# Patient Record
Sex: Female | Born: 1980 | Race: Black or African American | Hispanic: No | Marital: Married | Smoking: Never smoker
Health system: Southern US, Community
[De-identification: ages and names within clinical notes are randomized; demographics above are authoritative.]

## PROBLEM LIST (undated history)

## (undated) ENCOUNTER — Inpatient Hospital Stay (HOSPITAL_COMMUNITY): Payer: Self-pay

## (undated) DIAGNOSIS — F32A Depression, unspecified: Secondary | ICD-10-CM

## (undated) DIAGNOSIS — G932 Benign intracranial hypertension: Secondary | ICD-10-CM

## (undated) DIAGNOSIS — M1990 Unspecified osteoarthritis, unspecified site: Secondary | ICD-10-CM

## (undated) DIAGNOSIS — T7840XA Allergy, unspecified, initial encounter: Secondary | ICD-10-CM

## (undated) DIAGNOSIS — D72829 Elevated white blood cell count, unspecified: Secondary | ICD-10-CM

## (undated) DIAGNOSIS — E669 Obesity, unspecified: Secondary | ICD-10-CM

## (undated) DIAGNOSIS — R011 Cardiac murmur, unspecified: Secondary | ICD-10-CM

## (undated) DIAGNOSIS — G8929 Other chronic pain: Secondary | ICD-10-CM

## (undated) DIAGNOSIS — K219 Gastro-esophageal reflux disease without esophagitis: Secondary | ICD-10-CM

## (undated) DIAGNOSIS — I10 Essential (primary) hypertension: Secondary | ICD-10-CM

## (undated) DIAGNOSIS — M549 Dorsalgia, unspecified: Secondary | ICD-10-CM

## (undated) DIAGNOSIS — F419 Anxiety disorder, unspecified: Secondary | ICD-10-CM

## (undated) DIAGNOSIS — O139 Gestational [pregnancy-induced] hypertension without significant proteinuria, unspecified trimester: Secondary | ICD-10-CM

## (undated) DIAGNOSIS — F329 Major depressive disorder, single episode, unspecified: Secondary | ICD-10-CM

## (undated) DIAGNOSIS — O24419 Gestational diabetes mellitus in pregnancy, unspecified control: Secondary | ICD-10-CM

## (undated) DIAGNOSIS — R51 Headache: Secondary | ICD-10-CM

## (undated) DIAGNOSIS — R519 Headache, unspecified: Secondary | ICD-10-CM

## (undated) HISTORY — DX: Anxiety disorder, unspecified: F41.9

## (undated) HISTORY — DX: Gastro-esophageal reflux disease without esophagitis: K21.9

## (undated) HISTORY — DX: Obesity, unspecified: E66.9

## (undated) HISTORY — DX: Elevated white blood cell count, unspecified: D72.829

## (undated) HISTORY — DX: Allergy, unspecified, initial encounter: T78.40XA

## (undated) HISTORY — DX: Cardiac murmur, unspecified: R01.1

## (undated) HISTORY — DX: Unspecified osteoarthritis, unspecified site: M19.90

## (undated) HISTORY — PX: CHOLECYSTECTOMY: SHX55

## (undated) HISTORY — DX: Gestational diabetes mellitus in pregnancy, unspecified control: O24.419

---
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
## 2004-07-17 ENCOUNTER — Emergency Department (HOSPITAL_COMMUNITY): Admission: EM | Admit: 2004-07-17 | Discharge: 2004-07-17 | Payer: Self-pay | Admitting: Emergency Medicine

## 2005-08-21 ENCOUNTER — Ambulatory Visit (HOSPITAL_COMMUNITY): Admission: RE | Admit: 2005-08-21 | Discharge: 2005-08-21 | Payer: Self-pay | Admitting: Gastroenterology

## 2005-11-22 ENCOUNTER — Emergency Department (HOSPITAL_COMMUNITY): Admission: EM | Admit: 2005-11-22 | Discharge: 2005-11-22 | Payer: Self-pay | Admitting: Emergency Medicine

## 2005-12-06 ENCOUNTER — Encounter: Admission: RE | Admit: 2005-12-06 | Discharge: 2005-12-06 | Payer: Self-pay | Admitting: Gastroenterology

## 2007-07-11 ENCOUNTER — Emergency Department (HOSPITAL_COMMUNITY): Admission: EM | Admit: 2007-07-11 | Discharge: 2007-07-12 | Payer: Self-pay | Admitting: Emergency Medicine

## 2009-05-04 ENCOUNTER — Encounter: Admission: RE | Admit: 2009-05-04 | Discharge: 2009-05-04 | Payer: Self-pay | Admitting: Obstetrics and Gynecology

## 2009-06-23 ENCOUNTER — Encounter: Admission: RE | Admit: 2009-06-23 | Discharge: 2009-06-23 | Payer: Self-pay | Admitting: Obstetrics and Gynecology

## 2009-12-04 ENCOUNTER — Emergency Department (HOSPITAL_BASED_OUTPATIENT_CLINIC_OR_DEPARTMENT_OTHER): Admission: EM | Admit: 2009-12-04 | Discharge: 2009-12-04 | Payer: Self-pay | Admitting: Emergency Medicine

## 2010-10-23 ENCOUNTER — Emergency Department (HOSPITAL_BASED_OUTPATIENT_CLINIC_OR_DEPARTMENT_OTHER)
Admission: EM | Admit: 2010-10-23 | Discharge: 2010-10-24 | Disposition: A | Discharge status: Home / Self Care | Payer: PRIVATE HEALTH INSURANCE | Attending: Emergency Medicine | Admitting: Emergency Medicine

## 2010-10-23 ENCOUNTER — Emergency Department (INDEPENDENT_AMBULATORY_CARE_PROVIDER_SITE_OTHER): Payer: PRIVATE HEALTH INSURANCE

## 2010-10-23 DIAGNOSIS — R112 Nausea with vomiting, unspecified: Secondary | ICD-10-CM

## 2010-10-23 DIAGNOSIS — R109 Unspecified abdominal pain: Secondary | ICD-10-CM

## 2010-10-23 LAB — DIFFERENTIAL
Basophils Absolute: 0 10*3/uL (ref 0.0–0.1)
Basophils Relative: 0 % (ref 0–1)
Eosinophils Absolute: 0.1 10*3/uL (ref 0.0–0.7)
Eosinophils Relative: 1 % (ref 0–5)
Lymphocytes Relative: 31 % (ref 12–46)
Lymphs Abs: 3.2 10*3/uL (ref 0.7–4.0)
Monocytes Absolute: 0.7 K/uL (ref 0.1–1.0)
Monocytes Relative: 7 % (ref 3–12)
Neutro Abs: 6.3 10*3/uL (ref 1.7–7.7)
Neutrophils Relative %: 62 % (ref 43–77)

## 2010-10-23 LAB — URINALYSIS, ROUTINE W REFLEX MICROSCOPIC
Bilirubin Urine: NEGATIVE
Hgb urine dipstick: NEGATIVE
Ketones, ur: 15 mg/dL — AB
Nitrite: NEGATIVE
Protein, ur: NEGATIVE mg/dL
Specific Gravity, Urine: 1.019 (ref 1.005–1.030)
Urine Glucose, Fasting: NEGATIVE mg/dL
Urobilinogen, UA: 0.2 mg/dL (ref 0.0–1.0)
pH: 7 (ref 5.0–8.0)

## 2010-10-23 LAB — CBC
HCT: 40.9 % (ref 36.0–46.0)
Hemoglobin: 14.5 g/dL (ref 12.0–15.0)
MCH: 30.3 pg (ref 26.0–34.0)
MCHC: 35.5 g/dL (ref 30.0–36.0)
MCV: 85.6 fL (ref 78.0–100.0)
Platelets: 222 10*3/uL (ref 150–400)
RBC: 4.78 MIL/uL (ref 3.87–5.11)
RDW: 13.4 % (ref 11.5–15.5)
WBC: 10.3 10*3/uL (ref 4.0–10.5)

## 2010-10-23 LAB — COMPREHENSIVE METABOLIC PANEL
ALT: 7 U/L (ref 0–35)
AST: 17 U/L (ref 0–37)
Albumin: 4.6 g/dL (ref 3.5–5.2)
Alkaline Phosphatase: 55 U/L (ref 39–117)
BUN: 9 mg/dL (ref 6–23)
CO2: 23 mEq/L (ref 19–32)
Calcium: 9.6 mg/dL (ref 8.4–10.5)
Chloride: 106 mEq/L (ref 96–112)
Creatinine, Ser: 0.8 mg/dL (ref 0.4–1.2)
GFR calc Af Amer: >60 mL/min (ref >60)
GFR calc non Af Amer: >60 mL/min (ref >60)
Glucose, Bld: 75 mg/dL (ref 70–99)
Potassium: 3.4 mEq/L — ABNORMAL LOW (ref 3.5–5.1)
Sodium: 144 mEq/L (ref 135–145)
Total Bilirubin: 0.7 mg/dL (ref 0.3–1.2)
Total Protein: 8.5 g/dL — ABNORMAL HIGH (ref 6.0–8.3)

## 2010-10-23 LAB — PREGNANCY, URINE: Preg Test, Ur: NEGATIVE

## 2010-10-23 LAB — URINE MICROSCOPIC-ADD ON

## 2010-10-23 LAB — LIPASE, BLOOD: Lipase: 189 U/L (ref 23–300)

## 2010-10-23 MED ORDER — IOHEXOL 300 MG/ML  SOLN
100.0000 mL | Freq: Once | INTRAMUSCULAR | Status: AC | PRN
  Administered 2010-10-23: 100 mL via INTRAVENOUS

## 2010-10-28 ENCOUNTER — Emergency Department (HOSPITAL_BASED_OUTPATIENT_CLINIC_OR_DEPARTMENT_OTHER)
Admission: EM | Admit: 2010-10-28 | Discharge: 2010-10-28 | Disposition: A | Discharge status: Home / Self Care | Payer: PRIVATE HEALTH INSURANCE | Attending: Emergency Medicine | Admitting: Emergency Medicine

## 2010-10-28 DIAGNOSIS — R112 Nausea with vomiting, unspecified: Secondary | ICD-10-CM | POA: Insufficient documentation

## 2010-10-28 LAB — URINALYSIS, ROUTINE W REFLEX MICROSCOPIC
Bilirubin Urine: NEGATIVE
Hgb urine dipstick: NEGATIVE
Ketones, ur: NEGATIVE mg/dL
Nitrite: NEGATIVE
Protein, ur: NEGATIVE mg/dL
Specific Gravity, Urine: 1.04 — ABNORMAL HIGH (ref 1.005–1.030)
Urine Glucose, Fasting: NEGATIVE mg/dL
Urobilinogen, UA: 0.2 mg/dL (ref 0.0–1.0)
pH: 6 (ref 5.0–8.0)

## 2010-10-28 LAB — BASIC METABOLIC PANEL
BUN: 10 mg/dL (ref 6–23)
CO2: 25 mEq/L (ref 19–32)
Calcium: 9.6 mg/dL (ref 8.4–10.5)
Chloride: 105 mEq/L (ref 96–112)
Creatinine, Ser: 0.8 mg/dL (ref 0.4–1.2)
GFR calc Af Amer: >60 mL/min (ref >60)
GFR calc non Af Amer: >60 mL/min (ref >60)
Glucose, Bld: 95 mg/dL (ref 70–99)
Potassium: 4 mEq/L (ref 3.5–5.1)
Sodium: 143 mEq/L (ref 135–145)

## 2010-10-28 LAB — PREGNANCY, URINE: Preg Test, Ur: NEGATIVE

## 2010-10-29 ENCOUNTER — Emergency Department (HOSPITAL_COMMUNITY)
Admission: EM | Admit: 2010-10-29 | Discharge: 2010-10-29 | Disposition: A | Discharge status: Home / Self Care | Payer: PRIVATE HEALTH INSURANCE | Attending: Emergency Medicine | Admitting: Emergency Medicine

## 2010-10-29 DIAGNOSIS — F3289 Other specified depressive episodes: Secondary | ICD-10-CM | POA: Insufficient documentation

## 2010-10-29 DIAGNOSIS — F329 Major depressive disorder, single episode, unspecified: Secondary | ICD-10-CM | POA: Insufficient documentation

## 2010-10-29 LAB — COMPREHENSIVE METABOLIC PANEL
ALT: 11 U/L (ref 0–35)
AST: 14 U/L (ref 0–37)
Albumin: 4.2 g/dL (ref 3.5–5.2)
Alkaline Phosphatase: 41 U/L (ref 39–117)
BUN: 7 mg/dL (ref 6–23)
CO2: 26 mEq/L (ref 19–32)
Calcium: 9.4 mg/dL (ref 8.4–10.5)
Chloride: 104 mEq/L (ref 96–112)
Creatinine, Ser: 0.78 mg/dL (ref 0.4–1.2)
GFR calc Af Amer: >60 mL/min (ref >60)
GFR calc non Af Amer: >60 mL/min (ref >60)
Glucose, Bld: 89 mg/dL (ref 70–99)
Potassium: 3.5 mEq/L (ref 3.5–5.1)
Sodium: 137 mEq/L (ref 135–145)
Total Bilirubin: 0.4 mg/dL (ref 0.3–1.2)
Total Protein: 7.8 g/dL (ref 6.0–8.3)

## 2010-10-29 LAB — CBC
HCT: 39.4 % (ref 36.0–46.0)
Hemoglobin: 14.3 g/dL (ref 12.0–15.0)
MCH: 31.4 pg (ref 26.0–34.0)
MCHC: 36.3 g/dL — ABNORMAL HIGH (ref 30.0–36.0)
MCV: 86.6 fL (ref 78.0–100.0)
Platelets: 294 10*3/uL (ref 150–400)
RBC: 4.55 MIL/uL (ref 3.87–5.11)
RDW: 13.3 % (ref 11.5–15.5)
WBC: 10.2 10*3/uL (ref 4.0–10.5)

## 2010-10-29 LAB — RAPID URINE DRUG SCREEN, HOSP PERFORMED
Amphetamines: NOT DETECTED
Barbiturates: NOT DETECTED
Benzodiazepines: POSITIVE — AB
Cocaine: NOT DETECTED
Opiates: NOT DETECTED
Tetrahydrocannabinol: NOT DETECTED

## 2010-10-29 LAB — DIFFERENTIAL
Basophils Absolute: 0 10*3/uL (ref 0.0–0.1)
Basophils Relative: 0 % (ref 0–1)
Eosinophils Absolute: 0.1 10*3/uL (ref 0.0–0.7)
Eosinophils Relative: 1 % (ref 0–5)
Lymphocytes Relative: 38 % (ref 12–46)
Lymphs Abs: 3.8 10*3/uL (ref 0.7–4.0)
Monocytes Absolute: 0.5 10*3/uL (ref 0.1–1.0)
Monocytes Relative: 5 % (ref 3–12)
Neutro Abs: 5.8 10*3/uL (ref 1.7–7.7)
Neutrophils Relative %: 57 % (ref 43–77)

## 2010-10-29 LAB — POCT PREGNANCY, URINE: Preg Test, Ur: NEGATIVE

## 2010-10-29 LAB — ETHANOL: Alcohol, Ethyl (B): <5 mg/dL (ref 0–10)

## 2010-11-15 ENCOUNTER — Ambulatory Visit (HOSPITAL_COMMUNITY): Payer: PRIVATE HEALTH INSURANCE | Admitting: Psychology

## 2010-11-18 ENCOUNTER — Encounter (INDEPENDENT_AMBULATORY_CARE_PROVIDER_SITE_OTHER): Payer: PRIVATE HEALTH INSURANCE | Admitting: Psychology

## 2010-11-18 DIAGNOSIS — F322 Major depressive disorder, single episode, severe without psychotic features: Secondary | ICD-10-CM

## 2010-11-22 ENCOUNTER — Encounter (HOSPITAL_COMMUNITY): Payer: PRIVATE HEALTH INSURANCE | Admitting: Psychology

## 2010-12-26 ENCOUNTER — Other Ambulatory Visit: Payer: Self-pay | Admitting: Obstetrics and Gynecology

## 2010-12-26 DIAGNOSIS — N6321 Unspecified lump in the left breast, upper outer quadrant: Secondary | ICD-10-CM

## 2010-12-29 ENCOUNTER — Ambulatory Visit
Admission: RE | Admit: 2010-12-29 | Discharge: 2010-12-29 | Disposition: A | Discharge status: Home / Self Care | Payer: PRIVATE HEALTH INSURANCE | Source: Ambulatory Visit | Attending: Obstetrics and Gynecology | Admitting: Obstetrics and Gynecology

## 2010-12-29 ENCOUNTER — Other Ambulatory Visit: Payer: PRIVATE HEALTH INSURANCE

## 2010-12-29 DIAGNOSIS — N6321 Unspecified lump in the left breast, upper outer quadrant: Secondary | ICD-10-CM

## 2011-01-12 ENCOUNTER — Other Ambulatory Visit: Payer: Self-pay | Admitting: Neurological Surgery

## 2011-01-12 DIAGNOSIS — M545 Low back pain: Secondary | ICD-10-CM

## 2011-01-17 ENCOUNTER — Ambulatory Visit
Admission: RE | Admit: 2011-01-17 | Discharge: 2011-01-17 | Disposition: A | Discharge status: Home / Self Care | Payer: PRIVATE HEALTH INSURANCE | Source: Ambulatory Visit | Attending: Neurological Surgery | Admitting: Neurological Surgery

## 2011-01-17 DIAGNOSIS — M545 Low back pain: Secondary | ICD-10-CM

## 2011-02-03 NOTE — Op Note (Signed)
[name], Tracey Beard               ACCOUNT NO.:  [id number]   MEDICAL RECORD NO.:  [id number]          PATIENT TYPE:  AMB   LOCATION:  ENDO                         FACILITY:  [facility]   PHYSICIAN:  Anselmo Rod, M.D.  DATE OF BIRTH:  28-Oct-1980   DATE OF PROCEDURE:  08/21/2005  DATE OF DISCHARGE:                                 OPERATIVE REPORT   PROCEDURE PERFORMED:  Colonoscopy.   ENDOSCOPIST:  Anselmo Rod, M.D.   INSTRUMENT USED:  Olympus video colonoscope.   INDICATIONS FOR PROCEDURE:  A 30 year old [place] American female with a  history of worsening constipation, blood in stool, rule out colonic polyps,  masses, etc.   PREPROCEDURE PREPARATION:  Informed consent was procured from the patient.  The patient fasted for eight hours prior to the procedure and prepped with a  bottle of MiraLax and Gatorade the night of the procedure.  Risks and  benefits of the procedure including a 10% miss rate of cancer and polyps was  discussed with the patient as well.   PREPROCEDURE PHYSICAL:  VITAL SIGNS:  Stable vital signs.  NECK:  Supple.  CHEST:  Clear to auscultation.  CARDIOVASCULAR:  S1 and S2 regular.  ABDOMEN:  Soft with normal bowel sounds.   DESCRIPTION OF PROCEDURE:  The patient was placed in left lateral decubitus  position, sedated with an addition 1 mg of Versed.  Once the patient was  adequately sedated and maintained on low flow oxygen and continuous cardiac  monitoring, the Olympus video colonoscope was advanced from the rectum to  the cecum.  There was large amount of residual stool in the dependent areas  of the colon.  Multiple washings were done.  No other masses, polyps,  erosions or ulcerations or diverticula  were seen.  Retroflexion in the  rectum revealed small internal hemorrhoids.  The patient tolerated the  procedure well without immediate complications.   IMPRESSION:  1.Large amount of residual stool in the colon.  2.Small internal hemorrhoids seen  on retroflexion.   RECOMMENDATIONS:  I suspect the patient has irritable bowel syndrome and  therefore, trial of Zelnorm 6 mg PO BID has been recommended. Further  recommendation will be made in follow-up.      Anselmo Rod, M.D.  Electronically Signed     JNM/MEDQ  D:  08/21/2005  T:  08/22/2005  Job:  [id number]   cc:   Maxie Better, M.D.  Fax: [phone]

## 2011-02-03 NOTE — Op Note (Signed)
Tracey Beard, Tracey Beard               ACCOUNT NO.:  [id number]   MEDICAL RECORD NO.:  [id number]          PATIENT TYPE:  AMB   LOCATION:  ENDO                         FACILITY:  [facility]   PHYSICIAN:  Anselmo Rod, M.D.  DATE OF BIRTH:  1980-09-28   DATE OF PROCEDURE:  08/21/2005  DATE OF DISCHARGE:                                 OPERATIVE REPORT   PROCEDURE PERFORMED:  Esophagogastroduodenoscopy.   ENDOSCOPIST:  Anselmo Rod, M.D.   INSTRUMENT USED:  Olympus video panendoscope.   INDICATIONS FOR PROCEDURE:  A 30 year old [place] American female with a  history of severe abdominal pain, epigastric discomfort, rule out peptic  ulcer disease, esophagitis, gastritis, etc.   PREPROCEDURE PREPARATION:  Informed consent was procured from the patient.  The patient fasted for eight hours prior to the procedure.   PREPROCEDURE PHYSICAL EXAMINATION:  VITAL SIGNS:  Stable.  NECK:  Supple.  CHEST:  Clear to auscultation.  CARDIOVASCULAR:  S1 and S2 regular.  ABDOMEN:  Soft with normal bowel sounds.   DESCRIPTION OF PROCEDURE:  The patient was placed in left lateral decubitus  position, sedated with 100 mcg of Fentanyl and 9  mg of Versed in slow  incremental doses.  Once the patient was adequately sedated and maintained  on low flow oxygen and continuous cardiac monitoring, the Olympus video  panendoscope was advanced through the mouthpiece over the tongue and into  the esophagus under direct vision.  The entire esophagus appeared normal  with no evidence of ring, stricture, mass, esophagitis or Barrett's mucosa.  The scope was then advanced in the stomach.  The entire gastric mucosa and  proximal small-bowel appeared normal.   IMPRESSION:  Normal esophagogastroduodenoscopy.   RECOMMENDATIONS:  Proceed with colonoscopy at this time. Further  recommendations made thereafter.      Anselmo Rod, M.D.  Electronically Signed    JNM/MEDQ  D:  08/21/2005  T:  08/22/2005  Job:   [id number]   cc:   Maxie Better, M.D.  Fax: [phone]

## 2011-06-28 LAB — CBC
HCT: 38
Hemoglobin: 13.1
MCHC: 34.4
MCV: 88.7
Platelets: 261
RBC: 4.29
RDW: 13
WBC: 8

## 2011-06-28 LAB — DIFFERENTIAL
Basophils Absolute: 0
Basophils Relative: 0
Eosinophils Absolute: 0
Eosinophils Relative: 1
Lymphocytes Relative: 39
Lymphs Abs: 3.1
Monocytes Absolute: 0.6
Monocytes Relative: 7
Neutro Abs: 4.2
Neutrophils Relative %: 53

## 2011-06-28 LAB — BASIC METABOLIC PANEL
BUN: 13
CO2: 24
Calcium: 9.4
Chloride: 106
Creatinine, Ser: 0.72
GFR calc Af Amer: >60
GFR calc non Af Amer: >60
Glucose, Bld: 124 — ABNORMAL HIGH
Potassium: 3.7
Sodium: 137

## 2011-06-28 LAB — D-DIMER, QUANTITATIVE: D-Dimer, Quant: <0.22

## 2011-09-19 ENCOUNTER — Encounter: Payer: Self-pay | Admitting: *Deleted

## 2011-09-19 ENCOUNTER — Emergency Department (HOSPITAL_BASED_OUTPATIENT_CLINIC_OR_DEPARTMENT_OTHER)
Admission: EM | Admit: 2011-09-19 | Discharge: 2011-09-19 | Disposition: A | Discharge status: Home / Self Care | Payer: 59 | Attending: Emergency Medicine | Admitting: Emergency Medicine

## 2011-09-19 DIAGNOSIS — R109 Unspecified abdominal pain: Secondary | ICD-10-CM | POA: Insufficient documentation

## 2011-09-19 DIAGNOSIS — R1110 Vomiting, unspecified: Secondary | ICD-10-CM

## 2011-09-19 DIAGNOSIS — N390 Urinary tract infection, site not specified: Secondary | ICD-10-CM | POA: Insufficient documentation

## 2011-09-19 DIAGNOSIS — G8929 Other chronic pain: Secondary | ICD-10-CM | POA: Insufficient documentation

## 2011-09-19 DIAGNOSIS — R197 Diarrhea, unspecified: Secondary | ICD-10-CM | POA: Insufficient documentation

## 2011-09-19 DIAGNOSIS — M549 Dorsalgia, unspecified: Secondary | ICD-10-CM | POA: Insufficient documentation

## 2011-09-19 HISTORY — DX: Major depressive disorder, single episode, unspecified: F32.9

## 2011-09-19 HISTORY — DX: Depression, unspecified: F32.A

## 2011-09-19 LAB — URINALYSIS, ROUTINE W REFLEX MICROSCOPIC
Bilirubin Urine: NEGATIVE
Glucose, UA: NEGATIVE mg/dL
Hgb urine dipstick: NEGATIVE
Ketones, ur: NEGATIVE mg/dL
Nitrite: NEGATIVE
Protein, ur: NEGATIVE mg/dL
Specific Gravity, Urine: 1.015 (ref 1.005–1.030)
Urobilinogen, UA: 0.2 mg/dL (ref 0.0–1.0)
pH: 6.5 (ref 5.0–8.0)

## 2011-09-19 LAB — BASIC METABOLIC PANEL
BUN: 7 mg/dL (ref 6–23)
CO2: 25 mEq/L (ref 19–32)
Calcium: 9.2 mg/dL (ref 8.4–10.5)
Chloride: 103 mEq/L (ref 96–112)
Creatinine, Ser: 0.7 mg/dL (ref 0.50–1.10)
GFR calc Af Amer: >90 mL/min (ref >90)
GFR calc non Af Amer: >90 mL/min (ref >90)
Glucose, Bld: 93 mg/dL (ref 70–99)
Potassium: 3.8 mEq/L (ref 3.5–5.1)
Sodium: 139 mEq/L (ref 135–145)

## 2011-09-19 LAB — URINE MICROSCOPIC-ADD ON

## 2011-09-19 LAB — PREGNANCY, URINE: Preg Test, Ur: NEGATIVE

## 2011-09-19 MED ORDER — SODIUM CHLORIDE 0.9 % IV BOLUS (SEPSIS)
1000.0000 mL | Freq: Once | INTRAVENOUS | Status: AC
  Administered 2011-09-19: 1000 mL via INTRAVENOUS

## 2011-09-19 MED ORDER — ONDANSETRON HCL 4 MG/2ML IJ SOLN
4.0000 mg | Freq: Once | INTRAMUSCULAR | Status: AC
  Administered 2011-09-19: 4 mg via INTRAVENOUS
  Filled 2011-09-19: qty 2

## 2011-09-19 MED ORDER — SULFAMETHOXAZOLE-TRIMETHOPRIM 800-160 MG PO TABS: 1.0000 | ORAL_TABLET | Freq: Two times a day (BID) | ORAL | Status: AC

## 2011-09-19 MED ORDER — ONDANSETRON HCL 4 MG PO TABS: 4.0000 mg | ORAL_TABLET | Freq: Four times a day (QID) | ORAL | Status: AC

## 2011-09-19 MED ORDER — OXYCODONE-ACETAMINOPHEN 5-325 MG PO TABS: 2.0000 | ORAL_TABLET | ORAL | Status: AC | PRN

## 2011-09-19 MED ORDER — MORPHINE SULFATE 4 MG/ML IJ SOLN
4.0000 mg | Freq: Once | INTRAMUSCULAR | Status: AC
  Administered 2011-09-19: 4 mg via INTRAVENOUS
  Filled 2011-09-19: qty 1

## 2011-09-19 NOTE — ED Notes (Signed)
Abdominal pain, aching all over, nausea, vomiting and diarrhea since yesterday.

## 2011-09-19 NOTE — ED Provider Notes (Signed)
History     CSN: [id number]  Arrival date & time 09/19/11  4098   First MD Initiated Contact with Patient 09/19/11 1911      Chief Complaint  Patient presents with  . Abdominal Pain    (Consider location/radiation/quality/duration/timing/severity/associated sxs/prior treatment) HPI Comments: Pt states that she has chronic back pain and she is out of her percocet and she is need something more for pain:pt states that she sees Dr. Danielle Dess  Patient is a 31 y.o. female presenting with abdominal pain. The history is provided by the patient. No language interpreter was used.  Abdominal Pain The primary symptoms of the illness include abdominal pain, nausea, vomiting and diarrhea. The primary symptoms of the illness do not include fever, dysuria or vaginal discharge. The current episode started yesterday. The onset of the illness was sudden. The problem has not changed since onset. The patient states that she believes she is currently not pregnant. The patient has not had a change in bowel habit. Symptoms associated with the illness do not include constipation, urgency, frequency or back pain.    Past Medical History  Diagnosis Date  . Depression     History reviewed. No pertinent past surgical history.  No family history on file.  History  Substance Use Topics  . Smoking status: Never Smoker   . Smokeless tobacco: Not on file  . Alcohol Use: No    OB History    Grav Para Term Preterm Abortions TAB SAB Ect Mult Living                  Review of Systems  Constitutional: Negative for fever.  Gastrointestinal: Positive for nausea, vomiting, abdominal pain and diarrhea. Negative for constipation.  Genitourinary: Negative for dysuria, urgency, frequency and vaginal discharge.  Musculoskeletal: Negative for back pain.  All other systems reviewed and are negative.    Allergies  Lortab; Maxalt; Caine-1; and Latex  Home Medications   Current Outpatient Rx  Name Route Sig  Dispense Refill  . ALPRAZOLAM 1 MG PO TABS Oral Take 0.5 mg by mouth once as needed. For sleep     . IBUPROFEN 200 MG PO TABS Oral Take 800 mg by mouth every 6 (six) hours as needed. For pain      . OMEGA-3 FATTY ACIDS 500 MG PO CAPS Oral Take 1 capsule by mouth 4 (four) times daily.      Marland Kitchen PARAGARD INTRAUTERINE COPPER IU IUD Intrauterine 1 each by Intrauterine route once. Inserted 2012       BP 126/64  Pulse 104  Temp(Src) 99.5 F (37.5 C) (Oral)  Resp 20  SpO2 100%  LMP 09/11/2011  Physical Exam  Nursing note and vitals reviewed. Constitutional: She is oriented to person, place, and time. She appears well-developed and well-nourished.  HENT:  Head: Normocephalic.  Eyes: EOM are normal.  Neck: Normal range of motion. Neck supple.  Cardiovascular: Normal rate and regular rhythm.   Pulmonary/Chest: Effort normal and breath sounds normal.  Abdominal: Soft. Bowel sounds are normal. There is no tenderness. There is no CVA tenderness.  Musculoskeletal: Normal range of motion.  Neurological: She is alert and oriented to person, place, and time.  Skin: Skin is warm and dry.  Psychiatric: She has a normal mood and affect.    ED Course  Procedures (including critical care time)  Labs Reviewed  URINALYSIS, ROUTINE W REFLEX MICROSCOPIC - Abnormal; Notable for the following:    Leukocytes, UA TRACE (*)    All  other components within normal limits  URINE MICROSCOPIC-ADD ON - Abnormal; Notable for the following:    Bacteria, UA FEW (*)    All other components within normal limits  PREGNANCY, URINE  BASIC METABOLIC PANEL   No results found.   1. UTI (lower urinary tract infection)   2. Chronic back pain   3. Vomiting and diarrhea       MDM  Will treat pt for simple uti:pt is tolerating po without any problem:will give a couple of percocet for chronic pain        Teressa Lower, NP 09/19/11 2139

## 2011-09-20 NOTE — ED Provider Notes (Signed)
Medical screening examination/treatment/procedure(s) were performed by non-physician practitioner and as supervising physician I was immediately available for consultation/collaboration.   Vida Roller, MD 09/20/11 [phone]

## 2011-11-03 ENCOUNTER — Emergency Department (HOSPITAL_COMMUNITY)
Admission: EM | Admit: 2011-11-03 | Discharge: 2011-11-04 | Disposition: A | Discharge status: Home / Self Care | Payer: Self-pay | Attending: Emergency Medicine | Admitting: Emergency Medicine

## 2011-11-03 ENCOUNTER — Encounter (HOSPITAL_COMMUNITY): Payer: Self-pay | Admitting: Emergency Medicine

## 2011-11-03 DIAGNOSIS — M545 Low back pain, unspecified: Secondary | ICD-10-CM | POA: Insufficient documentation

## 2011-11-03 DIAGNOSIS — R269 Unspecified abnormalities of gait and mobility: Secondary | ICD-10-CM | POA: Insufficient documentation

## 2011-11-03 DIAGNOSIS — M25559 Pain in unspecified hip: Secondary | ICD-10-CM | POA: Insufficient documentation

## 2011-11-03 DIAGNOSIS — G8929 Other chronic pain: Secondary | ICD-10-CM | POA: Insufficient documentation

## 2011-11-03 DIAGNOSIS — M549 Dorsalgia, unspecified: Secondary | ICD-10-CM | POA: Insufficient documentation

## 2011-11-03 HISTORY — DX: Dorsalgia, unspecified: M54.9

## 2011-11-03 HISTORY — DX: Other chronic pain: G89.29

## 2011-11-03 NOTE — ED Provider Notes (Signed)
History     CSN: [id number]  Arrival date & time 11/03/11  2209   First MD Initiated Contact with Patient 11/03/11 2351      Chief Complaint  Patient presents with  . Back Pain  . Hip Pain    (Consider location/radiation/quality/duration/timing/severity/associated sxs/prior treatment) HPI  31 year old female with history of chronic back pain, with multiple ER visits for same, is presenting to the ED with chief complaints of low back pain. Pt sts she has been having low back pain for the past 5-6 years. In the past several days she is having increase pain to the lower back worsening on the left side. Pain is described as a sharp, and throbbing sensation radiating down to her left leg. Pain is similar to prior back pain. Patient states she has been evaluated by Delbert Harness in the past and also by Dr. Danielle Dess.  She has a lumbar MRI performed last year which shows mild disc protrusion at L4.  Pt received a "shot" at the visit with Dr. Danielle Dess.  She continues to have wax/wane pain. For the past several days she is unable to walk due to the pain. Her low back pain is worse in with sitting and improves with lying flat. She denies urinary or bowel incontinence, or caudal equina symptoms. Patient denies fever, chest pain, shortness of breath, abdominal pain, dysuria, or rash.  She denies any recent trauma.   Past Medical History  Diagnosis Date  . Depression   . Chronic back pain     History reviewed. No pertinent past surgical history.  No family history on file.  History  Substance Use Topics  . Smoking status: Never Smoker   . Smokeless tobacco: Not on file  . Alcohol Use: Yes     socially    OB History    Grav Para Term Preterm Abortions TAB SAB Ect Mult Living                  Review of Systems  All other systems reviewed and are negative.    Allergies  Lortab; Maxalt; Caine-1; and Latex  Home Medications   Current Outpatient Rx  Name Route Sig Dispense Refill  .  ALPRAZOLAM 1 MG PO TABS Oral Take 0.5 mg by mouth once as needed. For sleep     . CYANOCOBALAMIN 1000 MCG/ML IJ SOLN Intramuscular Inject 1,000 mcg into the muscle once.    . ICE & HEAT WRAP/GEL PACK PADS Does not apply 1 patch by Does not apply route daily as needed. USE FOR PAIN    . IBUPROFEN 200 MG PO TABS Oral Take 800 mg by mouth every 6 (six) hours as needed. For pain      . PARAGARD INTRAUTERINE COPPER IU IUD Intrauterine 1 each by Intrauterine route once. Inserted 2012     . OXYCODONE-ACETAMINOPHEN 10-325 MG PO TABS Oral Take 1 tablet by mouth every 4 (four) hours as needed. PAIN      BP 128/77  Pulse 101  Temp 98.4 F (36.9 C)  Resp 20  SpO2 100%  LMP 10/25/2011  Physical Exam  Constitutional: She is oriented to person, place, and time. She appears well-developed and well-nourished. No distress.       Patient appears tearful  HENT:  Head: Normocephalic and atraumatic.  Eyes: Conjunctivae are normal.  Neck: Normal range of motion. Neck supple.  Cardiovascular: Normal rate and regular rhythm.  Exam reveals no gallop and no friction rub.   No murmur heard.  Pulmonary/Chest: Effort normal. No respiratory distress. She has no wheezes.  Abdominal: Soft. Bowel sounds are normal. There is no tenderness.  Musculoskeletal:       Cervical back: Normal.       Thoracic back: Normal.       Lumbar back: She exhibits decreased range of motion, tenderness and pain. She exhibits no bony tenderness, no swelling, no edema, no deformity, no laceration and no spasm.  Neurological: She is alert and oriented to person, place, and time. She displays abnormal reflex.  Reflex Scores:      Patellar reflexes are 2+ on the right side and 0 on the left side.      Achilles reflexes are 2+ on the right side and 0 on the left side.      R lower extremities 5/5 strength L lower extremities 4/5 strength  Antalgic gait favoring L side.      ED Course  Procedures (including critical care time)  Labs  Reviewed - No data to display No results found.   No diagnosis found.    MDM  Pt with acute on chronic back pain.  She has decreased left patella DTR, but sts that she is aware since last checked by Dr. Danielle Dess.  She denies urinary/bowel incontinence or caudal equina sxs.  She does have f/u appointment with Dr. Alvester Morin.  With her persistent pain, we will attempt to pain control here in ED and will discharge with pain medication.    12:46 AM Discussed pt care with Sabino Dick, NP, who will assume care.      Fayrene Helper, PA-C 11/04/11 [phone]

## 2011-11-03 NOTE — ED Notes (Signed)
Pt presenting to ed with c/o back pain, hip pain and radiating into her pelvis. Pt states chronic pain x 5-6 years.

## 2011-11-04 LAB — URINALYSIS, ROUTINE W REFLEX MICROSCOPIC
Bilirubin Urine: NEGATIVE
Glucose, UA: NEGATIVE mg/dL
Hgb urine dipstick: NEGATIVE
Leukocytes, UA: NEGATIVE
Nitrite: NEGATIVE
Protein, ur: NEGATIVE mg/dL
Specific Gravity, Urine: 1.02 (ref 1.005–1.030)
Urobilinogen, UA: 0.2 mg/dL (ref 0.0–1.0)
pH: 5.5 (ref 5.0–8.0)

## 2011-11-04 LAB — PREGNANCY, URINE: Preg Test, Ur: NEGATIVE

## 2011-11-04 MED ORDER — OXYCODONE-ACETAMINOPHEN 10-325 MG PO TABS: 1.0000 | ORAL_TABLET | ORAL | Status: DC | PRN

## 2011-11-04 MED ORDER — OXYCODONE-ACETAMINOPHEN 5-325 MG PO TABS
2.0000 | ORAL_TABLET | Freq: Once | ORAL | Status: AC
  Administered 2011-11-04: 2 via ORAL
  Filled 2011-11-04: qty 2

## 2011-11-04 MED ORDER — ONDANSETRON 8 MG PO TBDP
8.0000 mg | ORAL_TABLET | Freq: Once | ORAL | Status: AC
  Administered 2011-11-04: 8 mg via ORAL
  Filled 2011-11-04: qty 1

## 2011-11-04 MED ORDER — HYDROMORPHONE HCL PF 1 MG/ML IJ SOLN
1.0000 mg | Freq: Once | INTRAMUSCULAR | Status: AC
Start: 2011-11-04 — End: 2011-11-04
  Administered 2011-11-04: 1 mg via INTRAVENOUS
  Filled 2011-11-04: qty 1

## 2011-11-04 NOTE — ED Provider Notes (Addendum)
Complains of low back pain left-sided paralumbar radiating to left hip for 6 years pain worse tonight. No loss of bladder or bowel control no fever Has run out of Percocet. No treatment prior to coming here patient alert mildly uncomfortable. Plan pain medicine intravenously. 1:25 AM patient's pain is much improved and she feels ready to go home she ambulates without difficulty Results for orders placed during the hospital encounter of 11/03/11  URINALYSIS, ROUTINE W REFLEX MICROSCOPIC      Component Value Range   Color, Urine YELLOW  YELLOW    APPearance CLEAR  CLEAR    Specific Gravity, Urine 1.020  1.005 - 1.030    pH 5.5  5.0 - 8.0    Glucose, UA NEGATIVE  NEGATIVE (mg/dL)   Hgb urine dipstick NEGATIVE  NEGATIVE    Bilirubin Urine NEGATIVE  NEGATIVE    Ketones, ur TRACE (*) NEGATIVE (mg/dL)   Protein, ur NEGATIVE  NEGATIVE (mg/dL)   Urobilinogen, UA 0.2  0.0 - 1.0 (mg/dL)   Nitrite NEGATIVE  NEGATIVE    Leukocytes, UA NEGATIVE  NEGATIVE   PREGNANCY, URINE      Component Value Range   Preg Test, Ur NEGATIVE  NEGATIVE    No results found.  Doug Sou, MD 11/04/11 1610  Doug Sou, MD 11/04/11 [id number]  Doug Sou, MD 11/04/11 5409

## 2011-11-04 NOTE — ED Provider Notes (Signed)
Medical screening examination/treatment/procedure(s) were conducted as a shared visit with non-physician practitioner(s) and myself.  I personally evaluated the patient during the encounter  Doug Sou, MD 11/04/11 [phone]

## 2011-11-04 NOTE — Discharge Instructions (Signed)
Back Pain, Adult Low back pain is very common. About 1 in 5 people have back pain.The cause of low back pain is rarely dangerous. The pain often gets better over time.About half of people with a sudden onset of back pain feel better in just 2 weeks. About 8 in 10 people feel better by 6 weeks.  CAUSES Some common causes of back pain include:  Strain of the muscles or ligaments supporting the spine.   Wear and tear (degeneration) of the spinal discs.   Arthritis.   Direct injury to the back.  DIAGNOSIS Most of the time, the direct cause of low back pain is not known.However, back pain can be treated effectively even when the exact cause of the pain is unknown.Answering your caregiver's questions about your overall health and symptoms is one of the most accurate ways to make sure the cause of your pain is not dangerous. If your caregiver needs more information, he or she may order lab work or imaging tests (X-rays or MRIs).However, even if imaging tests show changes in your back, this usually does not require surgery. HOME CARE INSTRUCTIONS For many people, back pain returns.Since low back pain is rarely dangerous, it is often a condition that people can learn to manageon their own.   Remain active. It is stressful on the back to sit or stand in one place. Do not sit, drive, or stand in one place for more than 30 minutes at a time. Take short walks on level surfaces as soon as pain allows.Try to increase the length of time you walk each day.   Do not stay in bed.Resting more than 1 or 2 days can delay your recovery.   Do not avoid exercise or work.Your body is made to move.It is not dangerous to be active, even though your back may hurt.Your back will likely heal faster if you return to being active before your pain is gone.   Pay attention to your body when you bend and lift. Many people have less discomfortwhen lifting if they bend their knees, keep the load close to their  bodies,and avoid twisting. Often, the most comfortable positions are those that put less stress on your recovering back.   Find a comfortable position to sleep. Use a firm mattress and lie on your side with your knees slightly bent. If you lie on your back, put a pillow under your knees.   Only take over-the-counter or prescription medicines as directed by your caregiver. Over-the-counter medicines to reduce pain and inflammation are often the most helpful.Your caregiver may prescribe muscle relaxant drugs.These medicines help dull your pain so you can more quickly return to your normal activities and healthy exercise.   Put ice on the injured area.   Put ice in a plastic bag.   Place a towel between your skin and the bag.   Leave the ice on for 15 to 20 minutes, 3 to 4 times a day for the first 2 to 3 days. After that, ice and heat may be alternated to reduce pain and spasms.   Ask your caregiver about trying back exercises and gentle massage. This may be of some benefit.   Avoid feeling anxious or stressed.Stress increases muscle tension and can worsen back pain.It is important to recognize when you are anxious or stressed and learn ways to manage it.Exercise is a great option.  SEEK MEDICAL CARE IF:  You have pain that is not relieved with rest or medicine.   You have   pain that does not improve in 1 week.   You have new symptoms.   You are generally not feeling well.  SEEK IMMEDIATE MEDICAL CARE IF:   You have pain that radiates from your back into your legs.   You develop new bowel or bladder control problems.   You have unusual weakness or numbness in your arms or legs.   You develop nausea or vomiting.   You develop abdominal pain.   You feel faint.  Document Released: 09/04/2005 Document Revised: 05/17/2011 Document Reviewed: 01/23/2011 [facility] Patient Information 2012 [place], [place].  Keep your scheduled appointment with [facility] orthopedics next week

## 2012-01-13 ENCOUNTER — Emergency Department (HOSPITAL_BASED_OUTPATIENT_CLINIC_OR_DEPARTMENT_OTHER)
Admission: EM | Admit: 2012-01-13 | Discharge: 2012-01-13 | Disposition: A | Discharge status: Home / Self Care | Payer: Self-pay | Attending: Emergency Medicine | Admitting: Emergency Medicine

## 2012-01-13 ENCOUNTER — Encounter (HOSPITAL_BASED_OUTPATIENT_CLINIC_OR_DEPARTMENT_OTHER): Payer: Self-pay | Admitting: *Deleted

## 2012-01-13 DIAGNOSIS — R42 Dizziness and giddiness: Secondary | ICD-10-CM | POA: Insufficient documentation

## 2012-01-13 DIAGNOSIS — R51 Headache: Secondary | ICD-10-CM | POA: Insufficient documentation

## 2012-01-13 DIAGNOSIS — G8929 Other chronic pain: Secondary | ICD-10-CM | POA: Insufficient documentation

## 2012-01-13 DIAGNOSIS — N390 Urinary tract infection, site not specified: Secondary | ICD-10-CM | POA: Insufficient documentation

## 2012-01-13 LAB — URINALYSIS, ROUTINE W REFLEX MICROSCOPIC
Bilirubin Urine: NEGATIVE
Glucose, UA: NEGATIVE mg/dL
Hgb urine dipstick: NEGATIVE
Ketones, ur: NEGATIVE mg/dL
Nitrite: NEGATIVE
Protein, ur: NEGATIVE mg/dL
Specific Gravity, Urine: 1.023 (ref 1.005–1.030)
Urobilinogen, UA: 0.2 mg/dL (ref 0.0–1.0)
pH: 6.5 (ref 5.0–8.0)

## 2012-01-13 LAB — BASIC METABOLIC PANEL
BUN: 10 mg/dL (ref 6–23)
CO2: 29 mEq/L (ref 19–32)
Calcium: 9.5 mg/dL (ref 8.4–10.5)
Chloride: 102 mEq/L (ref 96–112)
Creatinine, Ser: 0.7 mg/dL (ref 0.50–1.10)
GFR calc Af Amer: >90 mL/min (ref >90)
GFR calc non Af Amer: >90 mL/min (ref >90)
Glucose, Bld: 96 mg/dL (ref 70–99)
Potassium: 4.5 mEq/L (ref 3.5–5.1)
Sodium: 139 mEq/L (ref 135–145)

## 2012-01-13 LAB — URINE MICROSCOPIC-ADD ON

## 2012-01-13 LAB — CBC
HCT: 34 % — ABNORMAL LOW (ref 36.0–46.0)
Hemoglobin: 11.6 g/dL — ABNORMAL LOW (ref 12.0–15.0)
MCH: 29.4 pg (ref 26.0–34.0)
MCHC: 34.1 g/dL (ref 30.0–36.0)
MCV: 86.1 fL (ref 78.0–100.0)
Platelets: 331 10*3/uL (ref 150–400)
RBC: 3.95 MIL/uL (ref 3.87–5.11)
RDW: 13.1 % (ref 11.5–15.5)
WBC: 10.4 10*3/uL (ref 4.0–10.5)

## 2012-01-13 LAB — PREGNANCY, URINE: Preg Test, Ur: NEGATIVE

## 2012-01-13 MED ORDER — CEPHALEXIN 500 MG PO CAPS: 500.0000 mg | ORAL_CAPSULE | Freq: Two times a day (BID) | ORAL | Status: DC

## 2012-01-13 MED ORDER — LORAZEPAM 2 MG/ML IJ SOLN
2.0000 mg | Freq: Once | INTRAMUSCULAR | Status: AC
  Administered 2012-01-13: 2 mg via INTRAMUSCULAR

## 2012-01-13 MED ORDER — CEPHALEXIN 500 MG PO CAPS: 500.0000 mg | ORAL_CAPSULE | Freq: Two times a day (BID) | ORAL | Status: AC

## 2012-01-13 MED ORDER — CEPHALEXIN 250 MG PO CAPS
500.0000 mg | ORAL_CAPSULE | Freq: Once | ORAL | Status: AC
  Administered 2012-01-13: 500 mg via ORAL
  Filled 2012-01-13: qty 2

## 2012-01-13 MED ORDER — MECLIZINE HCL 25 MG PO TABS: 25.0000 mg | ORAL_TABLET | Freq: Three times a day (TID) | ORAL | Status: AC | PRN

## 2012-01-13 MED ORDER — LORAZEPAM 2 MG/ML IJ SOLN
INTRAMUSCULAR | Status: AC
  Administered 2012-01-13: 2 mg via INTRAMUSCULAR
  Filled 2012-01-13: qty 1

## 2012-01-13 MED ORDER — MECLIZINE HCL 25 MG PO TABS
25.0000 mg | ORAL_TABLET | Freq: Once | ORAL | Status: AC
  Administered 2012-01-13: 25 mg via ORAL
  Filled 2012-01-13: qty 1

## 2012-01-13 NOTE — Discharge Instructions (Signed)
Benign Positional Vertigo Vertigo means you feel like you or your surroundings are moving when they are not. Benign positional vertigo is the most common form of vertigo. Benign means that the cause of your condition is not serious. Benign positional vertigo is more common in older adults. CAUSES  Benign positional vertigo is the result of an upset in the labyrinth system. This is an area in the middle ear that helps control your balance. This may be caused by a viral infection, head injury, or repetitive motion. However, often no specific cause is found. SYMPTOMS  Symptoms of benign positional vertigo occur when you move your head or eyes in different directions. Some of the symptoms may include:  Loss of balance and falls.   Vomiting.   Blurred vision.   Dizziness.   Nausea.   Involuntary eye movements (nystagmus).  DIAGNOSIS  Benign positional vertigo is usually diagnosed by physical exam. If the specific cause of your benign positional vertigo is unknown, your caregiver may perform imaging tests, such as magnetic resonance imaging (MRI) or computed tomography (CT). TREATMENT  Your caregiver may recommend movements or procedures to correct the benign positional vertigo. Medicines such as meclizine, benzodiazepines, and medicines for nausea may be used to treat your symptoms. In rare cases, if your symptoms are caused by certain conditions that affect the inner ear, you may need surgery. HOME CARE INSTRUCTIONS   Follow your caregiver's instructions.   Move slowly. Do not make sudden body or head movements.   Avoid driving.   Avoid operating heavy machinery.   Avoid performing any tasks that would be dangerous to you or others during a vertigo episode.   Drink enough fluids to keep your urine clear or pale yellow.  SEEK IMMEDIATE MEDICAL CARE IF:   You develop problems with walking, weakness, numbness, or using your arms, hands, or legs.   You have difficulty speaking.   You  develop severe headaches.   Your nausea or vomiting continues or gets worse.   You develop visual changes.   Your family or friends notice any behavioral changes.   Your condition gets worse.   You have a fever.   You develop a stiff neck or sensitivity to light.  MAKE SURE YOU:   Understand these instructions.   Will watch your condition.   Will get help right away if you are not doing well or get worse.  Document Released: 06/12/2006 Document Revised: 08/24/2011 Document Reviewed: 05/25/2011 [facility] Patient Information 2012 [place], [place].Headaches, Frequently Asked Questions MIGRAINE HEADACHES Q: What is migraine? What causes it? How can I treat it? A: Generally, migraine headaches begin as a dull ache. Then they develop into a constant, throbbing, and pulsating pain. You may experience pain at the temples. You may experience pain at the front or back of one or both sides of the head. The pain is usually accompanied by a combination of:  Nausea.   Vomiting.   Sensitivity to light and noise.  Some people (about 15%) experience an aura (see below) before an attack. The cause of migraine is believed to be chemical reactions in the brain. Treatment for migraine may include over-the-counter or prescription medications. It may also include self-help techniques. These include relaxation training and biofeedback.  Q: What is an aura? A: About 15% of people with migraine get an "aura". This is a sign of neurological symptoms that occur before a migraine headache. You may see wavy or jagged lines, dots, or flashing lights. You might experience tunnel  vision or blind spots in one or both eyes. The aura can include visual or auditory hallucinations (something imagined). It may include disruptions in smell (such as strange odors), taste or touch. Other symptoms include:  Numbness.   A "pins and needles" sensation.   Difficulty in recalling or speaking the correct word.  These  neurological events may last as long as 60 minutes. These symptoms will fade as the headache begins. Q: What is a trigger? A: Certain physical or environmental factors can lead to or "trigger" a migraine. These include:  Foods.   Hormonal changes.   Weather.   Stress.  It is important to remember that triggers are different for everyone. To help prevent migraine attacks, you need to figure out which triggers affect you. Keep a headache diary. This is a good way to track triggers. The diary will help you talk to your healthcare professional about your condition. Q: Does weather affect migraines? A: Bright sunshine, hot, humid conditions, and drastic changes in barometric pressure may lead to, or "trigger," a migraine attack in some people. But studies have shown that weather does not act as a trigger for everyone with migraines. Q: What is the link between migraine and hormones? A: Hormones start and regulate many of your body's functions. Hormones keep your body in balance within a constantly changing environment. The levels of hormones in your body are unbalanced at times. Examples are during menstruation, pregnancy, or menopause. That can lead to a migraine attack. In fact, about three quarters of all women with migraine report that their attacks are related to the menstrual cycle.  Q: Is there an increased risk of stroke for migraine sufferers? A: The likelihood of a migraine attack causing a stroke is very remote. That is not to say that migraine sufferers cannot have a stroke associated with their migraines. In persons under age 23, the most common associated factor for stroke is migraine headache. But over the course of a person's normal life span, the occurrence of migraine headache may actually be associated with a reduced risk of dying from cerebrovascular disease due to stroke.  Q: What are acute medications for migraine? A: Acute medications are used to treat the pain of the headache  after it has started. Examples over-the-counter medications, NSAIDs, ergots, and triptans.  Q: What are the triptans? A: Triptans are the newest class of abortive medications. They are specifically targeted to treat migraine. Triptans are vasoconstrictors. They moderate some chemical reactions in the brain. The triptans work on receptors in your brain. Triptans help to restore the balance of a neurotransmitter called serotonin. Fluctuations in levels of serotonin are thought to be a main cause of migraine.  Q: Are over-the-counter medications for migraine effective? A: Over-the-counter, or "OTC," medications may be effective in relieving mild to moderate pain and associated symptoms of migraine. But you should see your caregiver before beginning any treatment regimen for migraine.  Q: What are preventive medications for migraine? A: Preventive medications for migraine are sometimes referred to as "prophylactic" treatments. They are used to reduce the frequency, severity, and length of migraine attacks. Examples of preventive medications include antiepileptic medications, antidepressants, beta-blockers, calcium channel blockers, and NSAIDs (nonsteroidal anti-inflammatory drugs). Q: Why are anticonvulsants used to treat migraine? A: During the past few years, there has been an increased interest in antiepileptic drugs for the prevention of migraine. They are sometimes referred to as "anticonvulsants". Both epilepsy and migraine may be caused by similar reactions in the brain.  Q: Why are antidepressants used to treat migraine? A: Antidepressants are typically used to treat people with depression. They may reduce migraine frequency by regulating chemical levels, such as serotonin, in the brain.  Q: What alternative therapies are used to treat migraine? A: The term "alternative therapies" is often used to describe treatments considered outside the scope of conventional Western medicine. Examples of  alternative therapy include acupuncture, acupressure, and yoga. Another common alternative treatment is herbal therapy. Some herbs are believed to relieve headache pain. Always discuss alternative therapies with your caregiver before proceeding. Some herbal products contain arsenic and other toxins. TENSION HEADACHES Q: What is a tension-type headache? What causes it? How can I treat it? A: Tension-type headaches occur randomly. They are often the result of temporary stress, anxiety, fatigue, or anger. Symptoms include soreness in your temples, a tightening band-like sensation around your head (a "vice-like" ache). Symptoms can also include a pulling feeling, pressure sensations, and contracting head and neck muscles. The headache begins in your forehead, temples, or the back of your head and neck. Treatment for tension-type headache may include over-the-counter or prescription medications. Treatment may also include self-help techniques such as relaxation training and biofeedback. CLUSTER HEADACHES Q: What is a cluster headache? What causes it? How can I treat it? A: Cluster headache gets its name because the attacks come in groups. The pain arrives with little, if any, warning. It is usually on one side of the head. A tearing or bloodshot eye and a runny nose on the same side of the headache may also accompany the pain. Cluster headaches are believed to be caused by chemical reactions in the brain. They have been described as the most severe and intense of any headache type. Treatment for cluster headache includes prescription medication and oxygen. SINUS HEADACHES Q: What is a sinus headache? What causes it? How can I treat it? A: When a cavity in the bones of the face and skull (a sinus) becomes inflamed, the inflammation will cause localized pain. This condition is usually the result of an allergic reaction, a tumor, or an infection. If your headache is caused by a sinus blockage, such as an infection,  you will probably have a fever. An x-ray will confirm a sinus blockage. Your caregiver's treatment might include antibiotics for the infection, as well as antihistamines or decongestants.  REBOUND HEADACHES Q: What is a rebound headache? What causes it? How can I treat it? A: A pattern of taking acute headache medications too often can lead to a condition known as "rebound headache." A pattern of taking too much headache medication includes taking it more than 2 days per week or in excessive amounts. That means more than the label or a caregiver advises. With rebound headaches, your medications not only stop relieving pain, they actually begin to cause headaches. Doctors treat rebound headache by tapering the medication that is being overused. Sometimes your caregiver will gradually substitute a different type of treatment or medication. Stopping may be a challenge. Regularly overusing a medication increases the potential for serious side effects. Consult a caregiver if you regularly use headache medications more than 2 days per week or more than the label advises. ADDITIONAL QUESTIONS AND ANSWERS Q: What is biofeedback? A: Biofeedback is a self-help treatment. Biofeedback uses special equipment to monitor your body's involuntary physical responses. Biofeedback monitors:  Breathing.   Pulse.   Heart rate.   Temperature.   Muscle tension.   Brain activity.  Biofeedback helps you refine and  perfect your relaxation exercises. You learn to control the physical responses that are related to stress. Once the technique has been mastered, you do not need the equipment any more. Q: Are headaches hereditary? A: Four out of five (80%) of people that suffer report a family history of migraine. Scientists are not sure if this is genetic or a family predisposition. Despite the uncertainty, a child has a 50% chance of having migraine if one parent suffers. The child has a 75% chance if both parents suffer.    Q: Can children get headaches? A: By the time they reach high school, most young people have experienced some type of headache. Many safe and effective approaches or medications can prevent a headache from occurring or stop it after it has begun.  Q: What type of doctor should I see to diagnose and treat my headache? A: Start with your primary caregiver. Discuss his or her experience and approach to headaches. Discuss methods of classification, diagnosis, and treatment. Your caregiver may decide to recommend you to a headache specialist, depending upon your symptoms or other physical conditions. Having diabetes, allergies, etc., may require a more comprehensive and inclusive approach to your headache. The National Headache Foundation will provide, upon request, a list of [facility] physician members in your state. Document Released: 11/25/2003 Document Revised: 08/24/2011 Document Reviewed: 05/04/2008 [facility] Patient Information 2012 [place], [place].

## 2012-01-13 NOTE — ED Provider Notes (Signed)
History     CSN: [id number]  Arrival date & time 01/13/12  2049   First MD Initiated Contact with Patient 01/13/12 2059      Chief Complaint  Patient presents with  . Headache  . Dizziness    (Consider location/radiation/quality/duration/timing/severity/associated sxs/prior treatment) HPI Comments: Pt states that she took her migraine medications at home:pt states that she is not having a headache at this time:pt states that she is having room spinning dizziness and a pressure in her head:pt denies visual changes  Patient is a 31 y.o. female presenting with headaches. The history is provided by the patient. No language interpreter was used.  Headache  This is a new problem. The current episode started more than 1 week ago. The problem occurs every few hours. The problem has not changed since onset.The headache is associated with nothing. Pain location: different places. The pain is moderate. The pain does not radiate. Pertinent negatives include no fever, no syncope, no nausea and no vomiting.    Past Medical History  Diagnosis Date  . Depression   . Chronic back pain     History reviewed. No pertinent past surgical history.  History reviewed. No pertinent family history.  History  Substance Use Topics  . Smoking status: Never Smoker   . Smokeless tobacco: Not on file  . Alcohol Use: Yes     socially    OB History    Grav Para Term Preterm Abortions TAB SAB Ect Mult Living                  Review of Systems  Constitutional: Negative for fever.  Cardiovascular: Negative for syncope.  Gastrointestinal: Negative for nausea and vomiting.  Neurological: Positive for headaches.  All other systems reviewed and are negative.    Allergies  Lortab; Maxalt; Caine-1; and Latex  Home Medications   Current Outpatient Rx  Name Route Sig Dispense Refill  . ALPRAZOLAM 1 MG PO TABS Oral Take 0.5 mg by mouth once as needed. For sleep     . IBUPROFEN 200 MG PO TABS Oral  Take 800 mg by mouth every 6 (six) hours as needed. For pain      . OXYCODONE-ACETAMINOPHEN 10-325 MG PO TABS Oral Take 1 tablet by mouth every 4 (four) hours as needed. PAIN 30 tablet 0  . ICE & HEAT WRAP/GEL PACK PADS Does not apply 1 patch by Does not apply route daily as needed. USE FOR PAIN    . PARAGARD INTRAUTERINE COPPER IU IUD Intrauterine 1 each by Intrauterine route once. Inserted 2012       BP 130/81  Pulse 80  Temp(Src) 98.3 F (36.8 C) (Oral)  Resp 18  SpO2 100%  LMP 12/30/2011  Physical Exam  Nursing note and vitals reviewed. Constitutional: She is oriented to person, place, and time. She appears well-developed and well-nourished.  HENT:  Head: Normocephalic and atraumatic.  Right Ear: External ear normal.  Left Ear: External ear normal.  Eyes: Conjunctivae are normal. Pupils are equal, round, and reactive to light.  Neck: Normal range of motion. Neck supple.  Cardiovascular: Normal rate and regular rhythm.   Pulmonary/Chest: Effort normal and breath sounds normal.  Abdominal: Soft. Bowel sounds are normal. There is no tenderness.  Musculoskeletal: Normal range of motion.  Neurological: She is alert and oriented to person, place, and time.  Skin: Skin is warm and dry.  Psychiatric: She has a normal mood and affect.    ED Course  Procedures (including  critical care time)  Labs Reviewed  URINALYSIS, ROUTINE W REFLEX MICROSCOPIC - Abnormal; Notable for the following:    Leukocytes, UA SMALL (*)    All other components within normal limits  URINE MICROSCOPIC-ADD ON - Abnormal; Notable for the following:    Bacteria, UA MANY (*)    All other components within normal limits  CBC - Abnormal; Notable for the following:    Hemoglobin 11.6 (*)    HCT 34.0 (*)    All other components within normal limits  PREGNANCY, URINE  BASIC METABOLIC PANEL   No results found.   1. UTI (lower urinary tract infection)   2. Headache   3. Vertigo       MDM  Pt treated  for uti:pt not currently having a headache:pt states that she didn't get much relief with the vertigo:pt given ativan and requested to leave without seeing if it works:discussed with pt and mother about ct scan and that I didn't feel like it was necessary at this time        Teressa Lower, NP 01/13/12 2311

## 2012-01-13 NOTE — ED Notes (Signed)
Upon entering pts room, pt is talking on the phone with no visible signs of distress.  Pt will not get off phone to speak with RN staff.

## 2012-01-13 NOTE — ED Provider Notes (Signed)
Medical screening examination/treatment/procedure(s) were performed by non-physician practitioner and as supervising physician I was immediately available for consultation/collaboration.    Celene Kras, MD 01/13/12 [phone]

## 2012-01-13 NOTE — ED Notes (Signed)
Pt states that she has had migraines x one week and dizziness as well as pressure in her head denies fever has had nausea and been using phenergan for it

## 2012-01-29 ENCOUNTER — Other Ambulatory Visit: Payer: Self-pay | Admitting: Neurology

## 2012-01-30 ENCOUNTER — Other Ambulatory Visit: Payer: Self-pay | Admitting: Neurology

## 2012-01-30 ENCOUNTER — Ambulatory Visit
Admission: RE | Admit: 2012-01-30 | Discharge: 2012-01-30 | Disposition: A | Discharge status: Home / Self Care | Payer: Medicaid Other | Source: Ambulatory Visit | Attending: Neurology | Admitting: Neurology

## 2012-01-30 NOTE — Discharge Instructions (Signed)

## 2012-01-30 NOTE — Progress Notes (Signed)
Pt return from LP, discharge instructions explained at length and family at bedside.JKL

## 2012-01-31 LAB — CSF PANEL 1
Glucose, CSF: 55 mg/dL (ref 43–76)
RBC Count, CSF: 1 cu mm — ABNORMAL HIGH
Total Protein, CSF: 26 mg/dL (ref 15–45)
Tube #: 3
WBC, CSF: 1 cu mm (ref 0–5)

## 2012-01-31 LAB — CRYPTOCOCCAL ANTIGEN, CSF: Crypto Ag: NEGATIVE

## 2012-02-01 LAB — ANGIOTENSIN CONVERTING ENZYME, CSF: ACE, CSF: 5 U/L (ref <=15)

## 2012-02-02 ENCOUNTER — Telehealth: Payer: Self-pay

## 2012-02-02 NOTE — Telephone Encounter (Signed)
Called patient in follow-up to her call to [place] two days ago with complaint of right-sided sharp low back pain "that felt like a pinched nerve" after her LP here three days ago.  Dr. Alfredo Batty had spoken with her, and she stated to him she felt that she was improving.  Today she states she's "doing great" and is pain-free.  She's back to "normal" and taking it day by day.  She thanked [place] for checking in on her; I told her to call with any questions or if we could help her in any way.  Donell Sievert, RN

## 2012-02-29 ENCOUNTER — Other Ambulatory Visit: Payer: Self-pay | Admitting: Neurology

## 2012-02-29 DIAGNOSIS — H4710 Unspecified papilledema: Secondary | ICD-10-CM

## 2012-02-29 DIAGNOSIS — G932 Benign intracranial hypertension: Secondary | ICD-10-CM

## 2012-02-29 DIAGNOSIS — R519 Headache, unspecified: Secondary | ICD-10-CM

## 2012-03-07 ENCOUNTER — Inpatient Hospital Stay
Admission: RE | Admit: 2012-03-07 | Discharge: 2012-03-07 | Payer: Medicaid Other | Source: Ambulatory Visit | Attending: Neurology | Admitting: Neurology

## 2012-03-07 NOTE — Discharge Instructions (Signed)

## 2012-07-18 ENCOUNTER — Ambulatory Visit
Admission: RE | Admit: 2012-07-18 | Discharge: 2012-07-18 | Disposition: A | Discharge status: Home / Self Care | Payer: No Typology Code available for payment source | Source: Ambulatory Visit | Attending: Neurology | Admitting: Neurology

## 2012-07-18 DIAGNOSIS — G932 Benign intracranial hypertension: Secondary | ICD-10-CM

## 2012-07-18 DIAGNOSIS — H4710 Unspecified papilledema: Secondary | ICD-10-CM

## 2012-07-18 DIAGNOSIS — R51 Headache: Secondary | ICD-10-CM

## 2012-07-18 LAB — GLUCOSE, CSF: Glucose, CSF: 63 mg/dL (ref 43–76)

## 2012-07-18 LAB — CSF CELL COUNT WITH DIFFERENTIAL
RBC Count, CSF: 0 cu mm
Tube #: 4
WBC, CSF: 0 cu mm (ref 0–5)

## 2012-07-18 LAB — PROTEIN, CSF: Total Protein, CSF: 24 mg/dL (ref 15–45)

## 2012-07-18 MED ORDER — IOHEXOL 180 MG/ML  SOLN
1.0000 mL | Freq: Once | INTRAMUSCULAR | Status: AC | PRN
  Administered 2012-07-18: 1 mL via INTRATHECAL

## 2012-07-23 ENCOUNTER — Other Ambulatory Visit: Payer: Medicaid Other

## 2012-09-18 HISTORY — PX: LUMBAR PUNCTURE: SHX1985

## 2012-10-08 ENCOUNTER — Emergency Department (HOSPITAL_COMMUNITY): Payer: Self-pay

## 2012-10-08 ENCOUNTER — Encounter (HOSPITAL_COMMUNITY): Payer: Self-pay

## 2012-10-08 ENCOUNTER — Emergency Department (HOSPITAL_COMMUNITY)
Admission: EM | Admit: 2012-10-08 | Discharge: 2012-10-08 | Disposition: A | Discharge status: Home / Self Care | Payer: Self-pay | Attending: Emergency Medicine | Admitting: Emergency Medicine

## 2012-10-08 DIAGNOSIS — Z79899 Other long term (current) drug therapy: Secondary | ICD-10-CM | POA: Insufficient documentation

## 2012-10-08 DIAGNOSIS — M25559 Pain in unspecified hip: Secondary | ICD-10-CM | POA: Insufficient documentation

## 2012-10-08 DIAGNOSIS — F329 Major depressive disorder, single episode, unspecified: Secondary | ICD-10-CM | POA: Insufficient documentation

## 2012-10-08 DIAGNOSIS — G8929 Other chronic pain: Secondary | ICD-10-CM | POA: Insufficient documentation

## 2012-10-08 DIAGNOSIS — R269 Unspecified abnormalities of gait and mobility: Secondary | ICD-10-CM | POA: Insufficient documentation

## 2012-10-08 DIAGNOSIS — M549 Dorsalgia, unspecified: Secondary | ICD-10-CM | POA: Insufficient documentation

## 2012-10-08 DIAGNOSIS — F3289 Other specified depressive episodes: Secondary | ICD-10-CM | POA: Insufficient documentation

## 2012-10-08 MED ORDER — MELOXICAM 7.5 MG PO TABS: 7.5000 mg | ORAL_TABLET | Freq: Every day | ORAL | Status: DC

## 2012-10-08 MED ORDER — OXYCODONE-ACETAMINOPHEN 5-325 MG PO TABS: 1.0000 | ORAL_TABLET | Freq: Four times a day (QID) | ORAL | Status: DC | PRN

## 2012-10-08 NOTE — ED Provider Notes (Signed)
History     CSN: [id number]  Arrival date & time 10/08/12  4098   First MD Initiated Contact with Patient 10/08/12 [phone]      Chief Complaint  Patient presents with  . Hip Pain    (Consider location/radiation/quality/duration/timing/severity/associated sxs/prior treatment) HPI Comments: Patient presents with complaint of right hip pain that began yesterday when she woke up. Patient states that the pain is stabbing in one point caused her to fall to her knees yesterday. She describes radiation and a more mild ache in her left hip. She denies pain, tingling, or numbness down either of her legs. Patient does have a history of chronic back pain which is at baseline. She has seen multiple specialists in the past for her back. Patient states she's been using ice and taking ibuprofen which has not been helping. When asked about her recent Percocet prescription, patient states that she got this from her dentist. She has not taken all these but states she does not know where these are. Onset of symptoms acute. Course is constant. Nothing makes it better. Movement and walking makes pain worse.  Patient is a 32 y.o. female presenting with hip pain. The history is provided by the patient.  Hip Pain Associated symptoms include arthralgias. Pertinent negatives include no abdominal pain, fever, joint swelling, nausea, numbness, rash, vomiting or weakness.    Past Medical History  Diagnosis Date  . Depression   . Chronic back pain     History reviewed. No pertinent past surgical history.  History reviewed. No pertinent family history.  History  Substance Use Topics  . Smoking status: Never Smoker   . Smokeless tobacco: Not on file  . Alcohol Use: Yes     Comment: socially    OB History    Grav Para Term Preterm Abortions TAB SAB Ect Mult Living                  Review of Systems  Constitutional: Negative for fever.  Gastrointestinal: Negative for nausea, vomiting and abdominal pain.    Genitourinary: Negative for dysuria.  Musculoskeletal: Positive for arthralgias and gait problem. Negative for joint swelling.  Skin: Negative for rash and wound.  Neurological: Negative for weakness and numbness.    Allergies  Lortab; Maxalt; Caine-1; and Latex  Home Medications   Current Outpatient Rx  Name  Route  Sig  Dispense  Refill  . FUROSEMIDE 40 MG PO TABS   Oral   Take 40 mg by mouth daily.         Marland Kitchen POTASSIUM CHLORIDE CRYS ER 20 MEQ PO TBCR   Oral   Take 20 mEq by mouth 2 (two) times daily.         Marland Kitchen ALPRAZOLAM 1 MG PO TABS   Oral   Take 0.5 mg by mouth once as needed. For sleep          . ICE & HEAT WRAP/GEL PACK PADS   Does not apply   1 patch by Does not apply route daily as needed. USE FOR PAIN         . IBUPROFEN 200 MG PO TABS   Oral   Take 800 mg by mouth every 6 (six) hours as needed. For pain           . PARAGARD INTRAUTERINE COPPER IU IUD   Intrauterine   1 each by Intrauterine route once. Inserted 2012          . OXYCODONE-ACETAMINOPHEN 10-325 MG PO  TABS   Oral   Take 1 tablet by mouth every 4 (four) hours as needed. PAIN   30 tablet   0     BP 142/68  Pulse 94  Temp 98.5 F (36.9 C) (Oral)  Resp 20  SpO2 100%  LMP 09/12/2012  Physical Exam  Nursing note and vitals reviewed. Constitutional: She appears well-developed and well-nourished.  HENT:  Head: Normocephalic and atraumatic.  Eyes: Pupils are equal, round, and reactive to light.  Neck: Normal range of motion. Neck supple.  Cardiovascular: Exam reveals no decreased pulses.   Musculoskeletal: She exhibits tenderness. She exhibits no edema.       Right hip: She exhibits tenderness. She exhibits normal range of motion, normal strength, no bony tenderness and no swelling.       Left hip: Normal.       Right knee: Normal.       Left knee: Normal.       Thoracic back: Normal.       Lumbar back: She exhibits tenderness. She exhibits normal range of motion and no  bony tenderness.       Legs: Neurological: She is alert. No sensory deficit.       Motor, sensation, and vascular distal to the injury is fully intact.   Skin: Skin is warm and dry.  Psychiatric: She has a normal mood and affect.    ED Course  Procedures (including critical care time)  Labs Reviewed - No data to display Dg Hip Complete Right  10/08/2012  *RADIOLOGY REPORT*  Clinical Data: Right hip pain radiating down the right leg.  RIGHT HIP - COMPLETE 2+ VIEW  Comparison: 01/17/2011  Findings: Transitional lumbosacral vertebra noted.  There is mild prominence of colonic stool.  A T-shaped IUD projects centrally over the anatomic pelvis.  No osseous abnormality of the right hip is observed.  No conventional radiographic findings of avascular necrosis. Sacroiliac joints appear unremarkable.  IMPRESSION:  1.  Transitional lumbosacral vertebra. 2.  Prominence of stool throughout the colon suggests constipation.   Original Report Authenticated By: Gaylyn Rong, M.D.      1. Hip pain     6:48 AM Patient seen and examined. Substance reporting database shows #30 Percocet 10/325 rx 09/27/2012.  Vital signs reviewed and are as follows: Filed Vitals:   10/08/12 0618  BP: 142/68  Pulse: 94  Temp: 98.5 F (36.9 C)  Resp: 20   7:57 AM X-ray results reviewed. Pt informed. Will d/c on pain medication and NSAID. She is to f/u with her ortho prn. Patient offered crutches but she doesn't want them.   Patient counseled on use of narcotic pain medications. Counseled not to combine these medications with others containing tylenol. Urged not to drink alcohol, drive, or perform any other activities that requires focus while taking these medications. The patient verbalizes understanding and agrees with the plan.    MDM  Hip pain, x-ray neg. 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 Doubt constipation on x-ray causing these symptoms. Do not suspect vascular etiology given location of pain such  as DVT. Patient appears well. She has ortho f/u.         Renne Crigler, [place] 10/08/12 0801  Renne Crigler, [place] 10/08/12 [phone]

## 2012-10-08 NOTE — ED Notes (Signed)
Pt fell yesterday and her right hip was sore, today the pain is radiating to the other hip

## 2012-10-13 NOTE — ED Provider Notes (Signed)
Medical screening examination/treatment/procedure(s) were performed by non-physician practitioner and as supervising physician I was immediately available for consultation/collaboration.  Marwan T Powers, MD 10/13/12 0721 

## 2012-11-07 ENCOUNTER — Encounter (HOSPITAL_BASED_OUTPATIENT_CLINIC_OR_DEPARTMENT_OTHER): Payer: Self-pay | Admitting: Emergency Medicine

## 2012-11-07 ENCOUNTER — Emergency Department (HOSPITAL_BASED_OUTPATIENT_CLINIC_OR_DEPARTMENT_OTHER)
Admission: EM | Admit: 2012-11-07 | Discharge: 2012-11-07 | Disposition: A | Discharge status: Home / Self Care | Payer: No Typology Code available for payment source | Attending: Emergency Medicine | Admitting: Emergency Medicine

## 2012-11-07 DIAGNOSIS — Y92410 Unspecified street and highway as the place of occurrence of the external cause: Secondary | ICD-10-CM | POA: Insufficient documentation

## 2012-11-07 DIAGNOSIS — S46811A Strain of other muscles, fascia and tendons at shoulder and upper arm level, right arm, initial encounter: Secondary | ICD-10-CM

## 2012-11-07 DIAGNOSIS — S43499A Other sprain of unspecified shoulder joint, initial encounter: Secondary | ICD-10-CM | POA: Insufficient documentation

## 2012-11-07 DIAGNOSIS — R413 Other amnesia: Secondary | ICD-10-CM | POA: Insufficient documentation

## 2012-11-07 DIAGNOSIS — S0990XA Unspecified injury of head, initial encounter: Secondary | ICD-10-CM | POA: Insufficient documentation

## 2012-11-07 DIAGNOSIS — G8929 Other chronic pain: Secondary | ICD-10-CM | POA: Insufficient documentation

## 2012-11-07 DIAGNOSIS — S060X9A Concussion with loss of consciousness of unspecified duration, initial encounter: Secondary | ICD-10-CM

## 2012-11-07 DIAGNOSIS — Z79899 Other long term (current) drug therapy: Secondary | ICD-10-CM | POA: Insufficient documentation

## 2012-11-07 DIAGNOSIS — M549 Dorsalgia, unspecified: Secondary | ICD-10-CM | POA: Insufficient documentation

## 2012-11-07 DIAGNOSIS — Y9389 Activity, other specified: Secondary | ICD-10-CM | POA: Insufficient documentation

## 2012-11-07 DIAGNOSIS — R110 Nausea: Secondary | ICD-10-CM | POA: Insufficient documentation

## 2012-11-07 DIAGNOSIS — S0993XA Unspecified injury of face, initial encounter: Secondary | ICD-10-CM | POA: Insufficient documentation

## 2012-11-07 MED ORDER — METHOCARBAMOL 500 MG PO TABS
500.0000 mg | ORAL_TABLET | Freq: Once | ORAL | Status: AC
  Administered 2012-11-07: 500 mg via ORAL
  Filled 2012-11-07: qty 1

## 2012-11-07 MED ORDER — IBUPROFEN 600 MG PO TABS: 600.0000 mg | ORAL_TABLET | Freq: Four times a day (QID) | ORAL | Status: DC | PRN

## 2012-11-07 MED ORDER — METHOCARBAMOL 500 MG PO TABS: 500.0000 mg | ORAL_TABLET | Freq: Two times a day (BID) | ORAL | Status: DC

## 2012-11-07 MED ORDER — OXYCODONE-ACETAMINOPHEN 5-325 MG PO TABS: 2.0000 | ORAL_TABLET | ORAL | Status: DC | PRN

## 2012-11-07 MED ORDER — OXYCODONE-ACETAMINOPHEN 5-325 MG PO TABS
1.0000 | ORAL_TABLET | Freq: Once | ORAL | Status: AC
  Administered 2012-11-07: 1 via ORAL
  Filled 2012-11-07 (×2): qty 1
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
## 2012-12-03 ENCOUNTER — Other Ambulatory Visit: Payer: Self-pay | Admitting: Neurology

## 2013-02-12 ENCOUNTER — Telehealth: Payer: Self-pay | Admitting: Neurology

## 2013-02-12 ENCOUNTER — Encounter: Payer: Self-pay | Admitting: Neurology

## 2013-02-12 NOTE — Telephone Encounter (Signed)
I called pt back re: her message.  She stated that back in June 2013, she was waiting on a clearance note from [place] about taking the phenteramine for wait loss.  Her bariatric clinic needs clearance.   I will send note to Dr. Anne Hahn since its been 1 yr now and then fax over to [place] at [phone]. This was in [facility] EMR.

## 2013-02-12 NOTE — Telephone Encounter (Signed)
Patient tells me she is waiting for a fax to go back to her Bariatric Physician.  She tells me Dr. Anne Hahn is aware.  You can reach her at [phone]

## 2013-02-14 NOTE — Telephone Encounter (Signed)
Faxed to [place] [phone].  LMVM for pt that did fax over.  She is to call back as needed.

## 2013-02-26 ENCOUNTER — Telehealth: Payer: Self-pay | Admitting: Neurology

## 2013-02-26 DIAGNOSIS — G932 Benign intracranial hypertension: Secondary | ICD-10-CM

## 2013-03-28 NOTE — Telephone Encounter (Signed)
I called patient. The patient is having increased pressure in the head, and pulsatile tinnitus. Her intracranial pressure may be elevated. The patient is also having peripheral swelling. The patient remains on Lasix, 40 mg daily. The patient could not tolerate Diamox. The patient has not been seen through this office since August of 2013. I'll get a lumbar puncture set up for her, and she will need a revisit.

## 2013-03-28 NOTE — Telephone Encounter (Signed)
Pt asking for order of LP.

## 2013-03-30 ENCOUNTER — Other Ambulatory Visit: Payer: Self-pay | Admitting: Neurology

## 2013-03-30 DIAGNOSIS — G932 Benign intracranial hypertension: Secondary | ICD-10-CM

## 2013-04-01 ENCOUNTER — Telehealth: Payer: Self-pay | Admitting: Neurology

## 2013-04-01 ENCOUNTER — Ambulatory Visit
Admission: RE | Admit: 2013-04-01 | Discharge: 2013-04-01 | Disposition: A | Discharge status: Home / Self Care | Payer: No Typology Code available for payment source | Source: Ambulatory Visit | Attending: Neurology | Admitting: Neurology

## 2013-04-01 ENCOUNTER — Telehealth: Payer: Self-pay

## 2013-04-01 VITALS — BP 122/67 | HR 64

## 2013-04-01 DIAGNOSIS — G932 Benign intracranial hypertension: Secondary | ICD-10-CM

## 2013-04-01 LAB — CSF CELL COUNT WITH DIFFERENTIAL
RBC Count, CSF: 0 cu mm
Tube #: 4
WBC, CSF: 3 cu mm (ref 0–5)

## 2013-04-01 LAB — PROTEIN, CSF: Total Protein, CSF: 25 mg/dL (ref 15–45)

## 2013-04-01 LAB — GLUCOSE, CSF: Glucose, CSF: 67 mg/dL (ref 43–76)

## 2013-04-01 NOTE — Telephone Encounter (Signed)
Please call back to schedule follow up of LP that was completed on 04/01/2013,  with Dr. Anne Hahn.

## 2013-04-01 NOTE — Telephone Encounter (Signed)
I called patient. The lumbar puncture revealed an opening pressure of 300. Spinal fluid is unremarkable. The patient will go up on the Lasix taking 40 mg in the morning, 20 mg in the evening. The patient remains on potassium 20 mEq twice daily.

## 2013-04-01 NOTE — Progress Notes (Signed)
Prescription for percocet given to pt by Dr. Bonnielee Haff for 12 tablets. Discharge instructions explained to pt and her husband.

## 2013-07-07 ENCOUNTER — Other Ambulatory Visit: Payer: Self-pay | Admitting: Neurology

## 2013-08-15 ENCOUNTER — Emergency Department (HOSPITAL_COMMUNITY)
Admission: EM | Admit: 2013-08-15 | Discharge: 2013-08-15 | Disposition: A | Discharge status: Home / Self Care | Payer: Self-pay | Attending: Emergency Medicine | Admitting: Emergency Medicine

## 2013-08-15 ENCOUNTER — Encounter (HOSPITAL_COMMUNITY): Payer: Self-pay | Admitting: Emergency Medicine

## 2013-08-15 DIAGNOSIS — Z79899 Other long term (current) drug therapy: Secondary | ICD-10-CM | POA: Insufficient documentation

## 2013-08-15 DIAGNOSIS — Z91040 Latex allergy status: Secondary | ICD-10-CM | POA: Insufficient documentation

## 2013-08-15 DIAGNOSIS — M545 Low back pain, unspecified: Secondary | ICD-10-CM | POA: Insufficient documentation

## 2013-08-15 DIAGNOSIS — G8929 Other chronic pain: Secondary | ICD-10-CM | POA: Insufficient documentation

## 2013-08-15 MED ORDER — MORPHINE SULFATE 4 MG/ML IJ SOLN: 6.0000 mg | Freq: Once | INTRAMUSCULAR | Status: DC

## 2013-08-15 MED ORDER — ONDANSETRON 8 MG PO TBDP
8.0000 mg | ORAL_TABLET | Freq: Once | ORAL | Status: DC
  Filled 2013-08-15: qty 1

## 2013-08-15 MED ORDER — OXYCODONE-ACETAMINOPHEN 5-325 MG PO TABS: 2.0000 | ORAL_TABLET | Freq: Three times a day (TID) | ORAL | Status: DC | PRN

## 2013-08-15 MED ORDER — ONDANSETRON 8 MG PO TBDP
8.0000 mg | ORAL_TABLET | Freq: Once | ORAL | Status: AC
  Administered 2013-08-15: 8 mg via ORAL

## 2013-08-15 MED ORDER — HYDROMORPHONE HCL PF 1 MG/ML IJ SOLN
1.0000 mg | Freq: Once | INTRAMUSCULAR | Status: AC
  Administered 2013-08-15: 1 mg via INTRAMUSCULAR
  Filled 2013-08-15: qty 1

## 2013-08-15 NOTE — ED Notes (Signed)
Patient reports that she has a torn ligament to her lower back joint. The patient is unable to get follow up care at this time. Steroid shot several months ago

## 2013-08-15 NOTE — ED Provider Notes (Signed)
CSN: [id number]     Arrival date & time 08/15/13  1233 History   First MD Initiated Contact with Patient 08/15/13 1301     Chief Complaint  Patient presents with  . Back Pain   (Consider location/radiation/quality/duration/timing/severity/associated sxs/prior Treatment) HPI This 32 year old female has chronic left low back sacroiliac joint region pain with radiation towards her left thigh to 4 hours a day severe pain for years, she has had no improvement with prior injections from orthopedic surgery, she had unremarkable MRI within the last couple years, she states she does not take chronic narcotics, she is no fever no trauma no IV drug abuse no weakness no numbness no change in bowel or bladder function, she has an exacerbation of her pain getting worse the last several months, she is able to walk unassisted with pain, there is no treatment prior to arrival, she would like an IV injection of pain medicine in the emergency department and prescription for Percocet since that has helped in the past even though she tries to avoid chronic narcotics. Past Medical History  Diagnosis Date  . Chronic back pain    History reviewed. No pertinent past surgical history. History reviewed. No pertinent family history. History  Substance Use Topics  . Smoking status: Never Smoker   . Smokeless tobacco: Not on file  . Alcohol Use: Yes     Comment: socially   OB History   Grav Para Term Preterm Abortions TAB SAB Ect Mult Living                 Review of Systems 10 Systems reviewed and are negative for acute change except as noted in the HPI. Allergies  Trazodone and nefazodone; Lortab; Maxalt; Caine-1; and Latex  Home Medications   Current Outpatient Rx  Name  Route  Sig  Dispense  Refill  . furosemide (LASIX) 20 MG tablet      TAKE 2 TABLETS BY MOUTH EVERY DAY   60 tablet   0     PATIENT NEEDS TO SCHEDULE APPT   . ibuprofen (ADVIL,MOTRIN) 200 MG tablet   Oral   Take 800 mg by mouth  every 6 (six) hours as needed. For inflammation         . Multiple Vitamin (MULTIVITAMIN WITH MINERALS) TABS tablet   Oral   Take 1 tablet by mouth daily.         . potassium chloride SA (K-DUR,KLOR-CON) 20 MEQ tablet   Oral   Take 20 mEq by mouth 2 (two) times daily.         Marland Kitchen oxyCODONE-acetaminophen (PERCOCET) 5-325 MG per tablet   Oral   Take 2 tablets by mouth every 8 (eight) hours as needed.   20 tablet   0    BP 145/105  Pulse 105  Temp(Src) 98.7 F (37.1 C) (Oral)  Wt 180 lb (81.647 kg)  SpO2 100%  LMP 07/15/2013 Physical Exam  Nursing note and vitals reviewed. Constitutional:  Awake, alert, nontoxic appearance with baseline speech.  HENT:  Head: Atraumatic.  Eyes: Pupils are equal, round, and reactive to light. Right eye exhibits no discharge. Left eye exhibits no discharge.  Neck: Neck supple.  Cardiovascular: Normal rate and regular rhythm.   No murmur heard. Pulmonary/Chest: Effort normal and breath sounds normal. No respiratory distress. She has no wheezes. She has no rales. She exhibits no tenderness.  Abdominal: Soft. Bowel sounds are normal. She exhibits no mass. There is no tenderness. There is no rebound.  Musculoskeletal:       Thoracic back: She exhibits no tenderness.       Lumbar back: She exhibits no tenderness.  Bilateral lower extremities non tender without new rashes or color change, baseline ROM with intact DP pulses, CR<2 secs all digits bilaterally, sensation baseline light touch bilaterally for pt, DTR's symmetric and intact bilaterally KJ / AJ, motor symmetric bilateral 5 / 5 hip flexion, quadriceps, hamstrings, EHL, foot dorsiflexion, foot plantarflexion, gait somewhat antalgic but without apparent new ataxia. Back is nontender in the midline nontender parathoracic nontender para lumbar does have well localized tenderness to the left sacroiliac joint region only with no overlying erythema or fluctuance to suggest abscess  Neurological: She  is alert.  Mental status baseline for patient.  Upper extremity motor strength and sensation intact and symmetric bilaterally.  Skin: No rash noted.  Psychiatric: She has a normal mood and affect.    ED Course  Procedures (including critical care time) Discussed the management of chronic pain in the emergency department and patient and her mother understand the emergency department follows guidelines for not routinely prescribing narcotics at discharge for chronic ongoing pain and if they are prescribed then often only for a few days, emergency department also does not typically provide IV or IM narcotic injections for chronic ongoing pain, even though ordered today, Pt will not expect narcotic injection every time in the ED for chronic ongoing pain. Ordered morphine IM and Rx for Percocet this visit, but Pt to f/u with her orthopedist for chronic pain mgmt. Patient / Family / Caregiver informed of clinical course, understand medical decision-making process, and agree with plan. Discussed  Labs Review Labs Reviewed - No data to display Imaging Review No results found.  EKG Interpretation   None       MDM   1. Chronic low back pain    I doubt any other EMC precluding discharge at this time including, but not necessarily limited to the following:SBI, cauda equina syndrome.   Hurman Horn, MD 08/15/13 2031

## 2013-08-28 ENCOUNTER — Other Ambulatory Visit: Payer: Self-pay | Admitting: Neurology

## 2013-09-08 ENCOUNTER — Telehealth: Payer: Self-pay | Admitting: Neurology

## 2013-09-08 MED ORDER — FUROSEMIDE 20 MG PO TABS: 40.0000 mg | ORAL_TABLET | Freq: Every day | ORAL | Status: DC

## 2013-09-08 NOTE — Telephone Encounter (Signed)
Rx was sent 12/11.  I have resent the Rx again.  I called the pharmacy.  Asked for Eunice Blase as indicated in phone note, they have no one that works there by that name.  Spoke with Dorene Grebe, she verified they did get the Rx.  They will fill it and contact the patient when it's ready for pick up.

## 2013-09-08 NOTE — Telephone Encounter (Signed)
Patient called to follow up, patient states the pharmacy is still saying that they have not received anything about her Lasik refill and patient has been without the medication for several weeks now. Please call. Patient scheduled a follow up appointment with Dr. Anne Hahn.

## 2013-09-08 NOTE — Telephone Encounter (Signed)
NEEDS REFILL ON LASIK--[facility] HAS SENT REQUEST--NOTHING ON FILE THERE FOR REFILL--HAS BEEN OUT OF RX FOR 2 WEEKS

## 2013-09-08 NOTE — Telephone Encounter (Signed)
Rx was sent on 08/31/13.  Noted appt is needed.  I called the patient back.  Got no answer.  Left message.

## 2013-09-14 ENCOUNTER — Ambulatory Visit: Payer: Medicaid Other

## 2013-10-02 ENCOUNTER — Ambulatory Visit: Payer: Medicaid Other | Admitting: Neurology

## 2013-10-04 ENCOUNTER — Other Ambulatory Visit: Payer: Self-pay | Admitting: Neurology

## 2013-11-05 ENCOUNTER — Other Ambulatory Visit: Payer: Self-pay | Admitting: Neurology

## 2013-12-03 ENCOUNTER — Ambulatory Visit
Admission: RE | Admit: 2013-12-03 | Discharge: 2013-12-03 | Disposition: A | Discharge status: Home / Self Care | Payer: BC Managed Care – PPO | Source: Ambulatory Visit | Attending: Physical Medicine and Rehabilitation | Admitting: Physical Medicine and Rehabilitation

## 2013-12-03 ENCOUNTER — Other Ambulatory Visit: Payer: Self-pay | Admitting: Physical Medicine and Rehabilitation

## 2013-12-03 DIAGNOSIS — M545 Low back pain, unspecified: Secondary | ICD-10-CM

## 2013-12-05 DIAGNOSIS — R61 Generalized hyperhidrosis: Secondary | ICD-10-CM | POA: Insufficient documentation

## 2013-12-05 DIAGNOSIS — F329 Major depressive disorder, single episode, unspecified: Secondary | ICD-10-CM | POA: Insufficient documentation

## 2013-12-05 DIAGNOSIS — F5104 Psychophysiologic insomnia: Secondary | ICD-10-CM | POA: Insufficient documentation

## 2013-12-05 DIAGNOSIS — F411 Generalized anxiety disorder: Secondary | ICD-10-CM | POA: Insufficient documentation

## 2013-12-05 DIAGNOSIS — G4700 Insomnia, unspecified: Secondary | ICD-10-CM | POA: Insufficient documentation

## 2013-12-05 DIAGNOSIS — F32A Depression, unspecified: Secondary | ICD-10-CM | POA: Insufficient documentation

## 2013-12-05 DIAGNOSIS — R002 Palpitations: Secondary | ICD-10-CM | POA: Insufficient documentation

## 2013-12-05 DIAGNOSIS — M549 Dorsalgia, unspecified: Secondary | ICD-10-CM | POA: Insufficient documentation

## 2013-12-05 DIAGNOSIS — Z803 Family history of malignant neoplasm of breast: Secondary | ICD-10-CM | POA: Insufficient documentation

## 2013-12-22 ENCOUNTER — Other Ambulatory Visit: Payer: Self-pay | Admitting: Neurology

## 2014-01-08 ENCOUNTER — Ambulatory Visit: Payer: Self-pay | Admitting: Neurology

## 2014-01-08 ENCOUNTER — Telehealth: Payer: Self-pay | Admitting: Neurology

## 2014-01-08 NOTE — Telephone Encounter (Signed)
This patient did not show for a revisit appointment today. 

## 2014-01-16 ENCOUNTER — Emergency Department (HOSPITAL_COMMUNITY): Payer: BC Managed Care – PPO

## 2014-01-16 ENCOUNTER — Emergency Department (HOSPITAL_COMMUNITY)
Admission: EM | Admit: 2014-01-16 | Discharge: 2014-01-16 | Disposition: A | Discharge status: Home / Self Care | Payer: BC Managed Care – PPO | Attending: Emergency Medicine | Admitting: Emergency Medicine

## 2014-01-16 ENCOUNTER — Encounter (HOSPITAL_COMMUNITY): Payer: Self-pay | Admitting: Emergency Medicine

## 2014-01-16 DIAGNOSIS — Y92410 Unspecified street and highway as the place of occurrence of the external cause: Secondary | ICD-10-CM | POA: Insufficient documentation

## 2014-01-16 DIAGNOSIS — Z91040 Latex allergy status: Secondary | ICD-10-CM | POA: Insufficient documentation

## 2014-01-16 DIAGNOSIS — S0990XA Unspecified injury of head, initial encounter: Secondary | ICD-10-CM | POA: Insufficient documentation

## 2014-01-16 DIAGNOSIS — Y939 Activity, unspecified: Secondary | ICD-10-CM | POA: Insufficient documentation

## 2014-01-16 DIAGNOSIS — IMO0002 Reserved for concepts with insufficient information to code with codable children: Secondary | ICD-10-CM | POA: Insufficient documentation

## 2014-01-16 DIAGNOSIS — S79919A Unspecified injury of unspecified hip, initial encounter: Secondary | ICD-10-CM | POA: Insufficient documentation

## 2014-01-16 DIAGNOSIS — G8929 Other chronic pain: Secondary | ICD-10-CM | POA: Insufficient documentation

## 2014-01-16 DIAGNOSIS — Z79899 Other long term (current) drug therapy: Secondary | ICD-10-CM | POA: Insufficient documentation

## 2014-01-16 DIAGNOSIS — S79929A Unspecified injury of unspecified thigh, initial encounter: Secondary | ICD-10-CM

## 2014-01-16 DIAGNOSIS — S298XXA Other specified injuries of thorax, initial encounter: Secondary | ICD-10-CM | POA: Insufficient documentation

## 2014-01-16 MED ORDER — ONDANSETRON HCL 4 MG/2ML IJ SOLN: 4.0000 mg | Freq: Once | INTRAMUSCULAR | Status: DC

## 2014-01-16 MED ORDER — ONDANSETRON 4 MG PO TBDP
4.0000 mg | ORAL_TABLET | Freq: Once | ORAL | Status: AC
  Administered 2014-01-16: 4 mg via ORAL
  Filled 2014-01-16: qty 1

## 2014-01-16 MED ORDER — CYCLOBENZAPRINE HCL 10 MG PO TABS: 10.0000 mg | ORAL_TABLET | Freq: Three times a day (TID) | ORAL | Status: DC | PRN

## 2014-01-16 MED ORDER — FENTANYL CITRATE 0.05 MG/ML IJ SOLN
100.0000 ug | Freq: Once | INTRAMUSCULAR | Status: AC
  Administered 2014-01-16: 100 ug via INTRAMUSCULAR
  Filled 2014-01-16: qty 2

## 2014-01-16 MED ORDER — FENTANYL CITRATE 0.05 MG/ML IJ SOLN: 100.0000 ug | Freq: Once | INTRAMUSCULAR | Status: DC

## 2014-01-16 MED ORDER — OXYCODONE-ACETAMINOPHEN 10-325 MG PO TABS: 1.0000 | ORAL_TABLET | ORAL | Status: DC | PRN
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
## 2014-01-19 ENCOUNTER — Emergency Department (HOSPITAL_COMMUNITY): Payer: BC Managed Care – PPO

## 2014-01-19 ENCOUNTER — Encounter (HOSPITAL_COMMUNITY): Payer: Self-pay | Admitting: Emergency Medicine

## 2014-01-19 ENCOUNTER — Emergency Department (HOSPITAL_COMMUNITY)
Admission: EM | Admit: 2014-01-19 | Discharge: 2014-01-19 | Disposition: A | Discharge status: Home / Self Care | Payer: BC Managed Care – PPO | Attending: Emergency Medicine | Admitting: Emergency Medicine

## 2014-01-19 DIAGNOSIS — M25559 Pain in unspecified hip: Secondary | ICD-10-CM | POA: Insufficient documentation

## 2014-01-19 DIAGNOSIS — M549 Dorsalgia, unspecified: Secondary | ICD-10-CM | POA: Insufficient documentation

## 2014-01-19 DIAGNOSIS — S63502A Unspecified sprain of left wrist, initial encounter: Secondary | ICD-10-CM

## 2014-01-19 DIAGNOSIS — G8911 Acute pain due to trauma: Secondary | ICD-10-CM | POA: Insufficient documentation

## 2014-01-19 DIAGNOSIS — M25439 Effusion, unspecified wrist: Secondary | ICD-10-CM | POA: Insufficient documentation

## 2014-01-19 DIAGNOSIS — Z91040 Latex allergy status: Secondary | ICD-10-CM | POA: Insufficient documentation

## 2014-01-19 DIAGNOSIS — G8929 Other chronic pain: Secondary | ICD-10-CM | POA: Insufficient documentation

## 2014-01-19 DIAGNOSIS — M25539 Pain in unspecified wrist: Secondary | ICD-10-CM | POA: Insufficient documentation

## 2014-01-19 DIAGNOSIS — Z79899 Other long term (current) drug therapy: Secondary | ICD-10-CM | POA: Insufficient documentation

## 2014-01-19 NOTE — Progress Notes (Signed)
Orthopedic Tech Progress Note Patient Details:  Tracey Beard [birth year] [id number]  Ortho Devices Type of Ortho Device: Thumb velcro splint Ortho Device/Splint Location: lue Ortho Device/Splint Interventions: Application   [name] 01/19/2014, 7:52 PM
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
## 2014-01-19 NOTE — ED Notes (Signed)
PA at bedside.
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
## 2014-01-19 NOTE — Discharge Instructions (Signed)
Please call your doctor for a followup appointment within 24-48 hours. When you talk to your doctor please let them know that you were seen in the emergency department and have them acquire all of your records so that they can discuss the findings with you and formulate a treatment plan to fully care for your new and ongoing problems. Please call and set up an appointment with your primary care provider to be reassessed within the next 24-48 hours Please call and set up an appointment with hand specialist to be reassessed if the pain continues Please continue to take medications as prescribed at home-Percocet and Flexeril Please avoid any physical or strenuous activity Please keep hand and brace at all times Continue to monitor symptoms closely and if symptoms are to worsen or change (fever greater than 101, chills, chest pain, shortness of breath, difficulty breathing, dizziness, numbness, tingling, worsening changes to pain symptoms, swelling, deformities, weakness) please report back to the ED immediately   Joint Sprain A sprain is a tear or stretch in the ligaments that hold a joint together. Severe sprains may need as long as 3-6 weeks of immobilization and/or exercises to heal completely. Sprained joints should be rested and protected. If not, they can become unstable and prone to re-injury. Proper treatment can reduce your pain, shorten the period of disability, and reduce the risk of repeated injuries. TREATMENT   Rest and elevate the injured joint to reduce pain and swelling.  Apply ice packs to the injury for 20-30 minutes every 2-3 hours for the next 2-3 days.  Keep the injury wrapped in a compression bandage or splint as long as the joint is painful or as instructed by your caregiver.  Do not use the injured joint until it is completely healed to prevent re-injury and chronic instability. Follow the instructions of your caregiver.  Long-term sprain management may require exercises  and/or treatment by a physical therapist. Taping or special braces may help stabilize the joint until it is completely better. SEEK MEDICAL CARE IF:   You develop increased pain or swelling of the joint.  You develop increasing redness and warmth of the joint.  You develop a fever.  It becomes stiff.  Your hand or foot gets cold or numb. Document Released: 10/12/2004 Document Revised: 11/27/2011 Document Reviewed: 09/21/2008 [facility] Patient Information 2014 [place], [place].

## 2014-01-22 NOTE — ED Provider Notes (Signed)
Medical screening examination/treatment/procedure(s) were performed by non-physician practitioner and as supervising physician I was immediately available for consultation/collaboration.  Casson Catena Y. Breleigh Carpino, MD 01/22/14 2252 

## 2014-03-03 ENCOUNTER — Other Ambulatory Visit: Payer: Self-pay | Admitting: Neurology

## 2014-03-03 NOTE — Telephone Encounter (Signed)
Patient has not been seen since 2013.  She no showed last appt and cancelled the two appts prior to that.

## 2014-03-05 DIAGNOSIS — R42 Dizziness and giddiness: Secondary | ICD-10-CM | POA: Insufficient documentation

## 2014-03-08 ENCOUNTER — Other Ambulatory Visit: Payer: Self-pay | Admitting: Neurology

## 2014-03-09 NOTE — Telephone Encounter (Signed)
Patient has not been seen since 2013. She no showed last appt and cancelled the two appts prior to that.

## 2014-05-27 DIAGNOSIS — R030 Elevated blood-pressure reading, without diagnosis of hypertension: Secondary | ICD-10-CM | POA: Insufficient documentation

## 2014-07-09 ENCOUNTER — Ambulatory Visit (INDEPENDENT_AMBULATORY_CARE_PROVIDER_SITE_OTHER): Payer: BC Managed Care – PPO | Admitting: Neurology

## 2014-07-09 VITALS — BP 128/90 | HR 92 | Resp 18 | Ht 61.0 in | Wt 235.0 lb

## 2014-07-09 DIAGNOSIS — G932 Benign intracranial hypertension: Secondary | ICD-10-CM

## 2014-07-09 DIAGNOSIS — E669 Obesity, unspecified: Secondary | ICD-10-CM

## 2014-07-09 MED ORDER — FUROSEMIDE 40 MG PO TABS: 40.0000 mg | ORAL_TABLET | Freq: Every day | ORAL | Status: DC

## 2014-07-09 NOTE — Patient Instructions (Signed)
1. Start Lasix 40mg  daily 2.Call your eye doctor for appointment, please have them send [place] results of your eye exam 3. Refer to Endocrinology for obesity, ?endocrine cause 4. Please bring in discs of your prior MRIs, we will obtain bloodwork done by Dr. Amedeo Kinsman 5. Follow-up in 2 months

## 2014-07-09 NOTE — Progress Notes (Signed)
NEUROLOGY CONSULTATION NOTE  Tracey Beard MRN: [id number] DOB: August 26, 1981  Referring provider: Dr. Collier Flowers Primary care provider: Dr. Collier Flowers  Reason for consult:  Pseudotumor cerebri  Dear Dr Kelton Pillar:  Thank you for your kind referral of Tracey Beard for consultation of the above symptoms. Although her history is well known to you, please allow me to reiterate it for the purpose of our medical record. Records and images were personally reviewed where available.  HISTORY OF PRESENT ILLNESS: This is a pleasant 33 year old right-handed woman presenting to establish care for pseudotumor cerebri. She had previously been seeing neurologist Dr. Jannifer Franklin. The patient states that she started having back pain around 3-1/2 years ago. Because of this, she went on bed rest and had gained 30 pounds in a short amount of time. She started having neck stiffness and pain, headaches, and blurred vision on both eyes, right more than left. 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 She saw Dr. Jannifer Franklin, records unavailable for review. There is a lumbar puncture from 01/2012 with opening pressure of 28.  She reports that after the LP, the blurred vision and neck pain resolved. MRI brain per patient normal. She was started on Diamox, which caused "horrible side effects, and despite increasing doses, an LP in 06/2012 showed an opening pressure of 36 cm H2O, closing pressure 11. She was switched to Lasix, which she had been taking for 2 years. She had called Dr. Jannifer Franklin in 02/2013 to report increased head pressure and pulsatile tinnitus,  LP on 03/2013, opening pressure 30 cm H2O, closing pressure 11. Lasix dose was increased to 40mg  in AM, 20mg  in PM, continue potassium 20 mEq daily. She reports the tinnitus did not improve much after the LP. Tinnitus is mainly on the right side, if she presses behind her ear, it would stop. 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  She had been unable to obtain Lasix for the past year, and reports that since then, she has had significant weight gain despite going to the gym. She has "gained another person." She was told by her trainer 3 months ago that she had increased girth in a 2-week period.  Her BP has also increased up to 170/110 in the past 2 months.  She denies any headaches, visual changes, focal numbness/tingling/weakness, neck pain. She has chronic back pain. 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 She has been seeing pain management and continues on Cymbalta. She had neck stiffness last month and increased tinnitus and was given a week supply of Lasix.  She reports generalized malaise.   Laboratory Data 04/2014: CBC normal. CMP: Na 140, K 4.6, Cl 106, Ca 10, BUN 10, Crea 0.85, glucose 124. TSH 0.73, HbA1c 5.9.  PAST MEDICAL HISTORY: Past Medical History  Diagnosis Date  . Chronic back pain     PAST SURGICAL HISTORY: No past surgical history on file.  MEDICATIONS: Current Outpatient Prescriptions on File Prior to Visit  Medication Sig Dispense Refill  . ALPRAZolam (XANAX) 1 MG tablet Take 1 mg by mouth at bedtime as needed.      . DULoxetine (CYMBALTA) 60 MG capsule Take 60 mg by mouth daily.      Marland Kitchen ibuprofen (ADVIL,MOTRIN) 200 MG tablet Take 800 mg by mouth every 6 (six) hours as needed. For inflammation      . Oxycodone HCl 10 MG TABS Take 10  mg by mouth every 6 (six) hours as needed.      . promethazine (PHENERGAN) 25 MG tablet Take 25 mg by mouth daily.       No current facility-administered medications on file prior to visit.    ALLERGIES: Allergies  Allergen Reactions  . Trazodone And Nefazodone Anaphylaxis  . Lortab [Hydrocodone-Acetaminophen] Itching  . Maxalt [Rizatriptan Benzoate] Other (See Comments)    Chest tightness   . Caine-1 [Lidocaine Hcl] Swelling and Rash  . Latex Rash    FAMILY HISTORY: No family history on file.  SOCIAL  HISTORY: History   Social History  . Marital Status: Married    Spouse Name: N/A    Number of Children: N/A  . Years of Education: N/A   Occupational History  . Not on file.   Social History Main Topics  . Smoking status: Never Smoker   . Smokeless tobacco: Not on file  . Alcohol Use: Yes     Comment: socially  . Drug Use: No  . Sexual Activity: Not on file   Other Topics Concern  . Not on file   Social History Narrative  . No narrative on file    REVIEW OF SYSTEMS: Constitutional: No fevers, chills, or sweats, + generalized fatigue, no change in appetite Eyes: No visual changes, double vision, eye pain Ear, nose and throat: No hearing loss, ear pain, nasal congestion, sore throat Cardiovascular: No chest pain, palpitations Respiratory:  No shortness of breath at rest or with exertion, wheezes GastrointestinaI: No nausea, vomiting, + occl diarrhea, abdominal pain, fecal incontinence Genitourinary:  No dysuria, urinary retention or frequency Musculoskeletal:  No neck pain, back pain Integumentary: No rash, pruritus, skin lesions Neurological: as above Psychiatric: No depression, insomnia, anxiety Endocrine: No palpitations, fatigue, diaphoresis, mood swings, change in appetite, change in weight, increased thirst Hematologic/Lymphatic:  No anemia, purpura, petechiae. Allergic/Immunologic: no itchy/runny eyes, nasal congestion, recent allergic reactions, rashes  PHYSICAL EXAM: Filed Vitals:   07/09/14 0842  BP: 128/90  Pulse: 92  Resp: 18   General: No acute distress Head:  Normocephalic/atraumatic Eyes: Fundoscopic exam shows bilateral sharp discs, no vessel changes, exudates, or hemorrhages Neck: supple, no paraspinal tenderness, full range of motion Back: No paraspinal tenderness Heart: regular rate and rhythm Lungs: Clear to auscultation bilaterally. Vascular: No carotid bruits. Skin/Extremities: No rash, no edema Neurological Exam: Mental status: alert  and oriented to person, place, and time, no dysarthria or aphasia, Fund of knowledge is appropriate.  Recent and remote memory are intact.  Attention and concentration are normal.    Able to name objects and repeat phrases. Cranial nerves: CN I: not tested CN II: pupils equal, round and reactive to light, visual fields intact, fundi unremarkable. CN III, IV, VI:  full range of motion, no nystagmus, no ptosis CN V: facial sensation intact CN VII: upper and lower face symmetric CN VIII: hearing intact to finger rub CN IX, X: gag intact, uvula midline CN XI: sternocleidomastoid and trapezius muscles intact CN XII: tongue midline Bulk & Tone: normal, no fasciculations. Motor: 5/5 throughout with no pronator drift. Sensation: intact to light touch, cold, pin, vibration and joint position sense.  No extinction to double simultaneous stimulation.  Romberg test negative Deep Tendon Reflexes: +2 throughout, no ankle clonus Plantar responses: downgoing bilaterally Cerebellar: no incoordination on finger to nose, heel to shin. No dysdiadochokinesia Gait: narrow-based and steady, able to tandem walk adequately. Tremor: none  IMPRESSION: This is a 33 year old right-handed woman with a  history of pseudotumor cerebri, obesity, chronic back pain. LP in 2014 showed an opening pressure of 30 cm H2O.  She has been unable to obtain Lasix over the past month. She denies any headaches or vision changes, however continues to have intermittent tinnitus. We will restart Lasix 40mg  daily. Her exam today is normal, optic discs sharp. She will see her ophthalmologist for formal eye evaluation. Records from Dr. Jannifer Franklin and imaging discs requested for review. Continue to monitor potassium while on Lasix. We discussed the importance of weight loss as part of management of pseudotumor cerebri, she has expressed significant difficulty despite diet and working with a Physiological scientist, and wonders about endocrine cause. She will  be referred for evaluation. She will follow-up in 2 months and knows to call our office for any problems.  Thank you for allowing me to participate in the care of this patient. Please do not hesitate to call for any questions or concerns.   Ellouise Newer, M.D.  CC: Dr. Collier Flowers

## 2014-07-11 ENCOUNTER — Encounter: Payer: Self-pay | Admitting: Neurology

## 2014-07-11 ENCOUNTER — Other Ambulatory Visit: Payer: Self-pay | Admitting: Neurology

## 2014-07-11 DIAGNOSIS — E669 Obesity, unspecified: Secondary | ICD-10-CM | POA: Insufficient documentation

## 2014-07-11 DIAGNOSIS — G8929 Other chronic pain: Secondary | ICD-10-CM | POA: Insufficient documentation

## 2014-07-11 DIAGNOSIS — M549 Dorsalgia, unspecified: Secondary | ICD-10-CM

## 2014-07-11 DIAGNOSIS — G932 Benign intracranial hypertension: Secondary | ICD-10-CM

## 2014-07-13 MED ORDER — POTASSIUM CHLORIDE CRYS ER 20 MEQ PO TBCR: 20.0000 meq | EXTENDED_RELEASE_TABLET | Freq: Every day | ORAL | Status: DC

## 2014-07-13 NOTE — Telephone Encounter (Signed)
Left msg on voicemail to return my call.

## 2014-07-13 NOTE — Addendum Note (Signed)
Addended by: Margarite Gouge M on: 07/13/2014 10:00 AM   Modules accepted: Orders

## 2014-07-13 NOTE — Telephone Encounter (Signed)
Message copied by Thurmon Fair on Mon Jul 13, 2014  9:51 AM ------      Message from: Cameron Sprang      Created: Sat Jul 11, 2014 11:16 AM      Regarding: potassium       Pls let her know I reviewed labs, potassium level is good, but would recommend she start a daily potassium supplement of 20 meq daily, then we will re-check potassium level in 2 weeks. Thanks ------

## 2014-07-14 ENCOUNTER — Telehealth: Payer: Self-pay | Admitting: Neurology

## 2014-07-14 NOTE — Telephone Encounter (Signed)
Pt states that someone called her yesterday and she is returning the call but is unsure of who called her or for what [phone] was not sure if you called her or not

## 2014-07-14 NOTE — Telephone Encounter (Signed)
Lmom to return my call. 

## 2014-07-14 NOTE — Telephone Encounter (Signed)
Pt is returning Tiffany's call. CB# [phone] / [name]

## 2014-07-14 NOTE — Telephone Encounter (Signed)
Left msg for patient to return my call

## 2014-07-14 NOTE — Telephone Encounter (Signed)
Spoke with patient about having bloodwork in 2 weeks to check her potassium level since she was just started on potassium pill. Order is ready. She will come by our office to pick up order for bloodwork in 2 weeks. Order left up front.

## 2014-09-03 ENCOUNTER — Telehealth: Payer: Self-pay | Admitting: Family Medicine

## 2014-09-03 ENCOUNTER — Ambulatory Visit: Payer: BC Managed Care – PPO | Admitting: Neurology

## 2014-09-03 NOTE — Telephone Encounter (Signed)
Called patient after calling Dr. Altheimer's office to check on status of referral sent to them on 10/26. Dr. Elyse Hsu requests to review records first before appt can be made for new patients.  I called his office today to see if he was willing to see the patient and if appt had been made. I spoke with the new patient referral coordinator and she informed me that she has tried to reach patient and has left several messages for patient to call her to set-up appt with no return call. I did tell I that we appreciated them trying to get patient in & I would call patient to try and follow up about this.  I did speak with patient and she states that she did get the messages, but she has been so busy that she hasn't been able to call them back. She stated that she still had the offices information on her voicemail & she would call either today or tomorrow morning to set up her appt.

## 2014-09-04 ENCOUNTER — Telehealth: Payer: Self-pay | Admitting: Neurology

## 2014-09-04 ENCOUNTER — Ambulatory Visit: Payer: BC Managed Care – PPO | Admitting: Neurology

## 2014-09-04 NOTE — Telephone Encounter (Signed)
Pt no showed 09/03/14 appt w/ Dr. Delice Lesch. Letter not sent b/c pt has since r/s to 09/04/14 / [name]

## 2014-09-08 ENCOUNTER — Telehealth: Payer: Self-pay | Admitting: Neurology

## 2014-09-08 NOTE — Telephone Encounter (Signed)
Pt no showed back to back appts with Dr. Delice Lesch. One on 09/03/14 and another 09/04/14. This is her 3rd in 2015. Please advise how to proceed. / [name]

## 2014-09-08 NOTE — Telephone Encounter (Signed)
Schedule one more appt, pls inform her that if she misses 3rd appt, per our office policy, we will be unable to see her anymore. Thanks

## 2014-09-09 ENCOUNTER — Encounter: Payer: Self-pay | Admitting: *Deleted

## 2014-09-09 NOTE — Progress Notes (Signed)
No show letter sent for 09/04/2014

## 2014-09-09 NOTE — Telephone Encounter (Signed)
See previous documentation.  Danae Chen - please send no show letter / Gayleen Orem.

## 2014-10-13 ENCOUNTER — Other Ambulatory Visit: Payer: Self-pay | Admitting: *Deleted

## 2014-10-13 ENCOUNTER — Ambulatory Visit
Admission: RE | Admit: 2014-10-13 | Discharge: 2014-10-13 | Disposition: A | Discharge status: Home / Self Care | Payer: Self-pay | Source: Ambulatory Visit | Attending: *Deleted | Admitting: *Deleted

## 2014-10-13 DIAGNOSIS — M47819 Spondylosis without myelopathy or radiculopathy, site unspecified: Secondary | ICD-10-CM

## 2014-12-17 DIAGNOSIS — N979 Female infertility, unspecified: Secondary | ICD-10-CM | POA: Insufficient documentation

## 2015-01-13 DIAGNOSIS — R740 Nonspecific elevation of levels of transaminase and lactic acid dehydrogenase [LDH]: Secondary | ICD-10-CM

## 2015-01-13 DIAGNOSIS — IMO0002 Reserved for concepts with insufficient information to code with codable children: Secondary | ICD-10-CM | POA: Insufficient documentation

## 2015-01-13 DIAGNOSIS — R7402 Elevation of levels of lactic acid dehydrogenase (LDH): Secondary | ICD-10-CM | POA: Insufficient documentation

## 2015-01-13 DIAGNOSIS — R7401 Elevation of levels of liver transaminase levels: Secondary | ICD-10-CM | POA: Insufficient documentation

## 2015-02-25 ENCOUNTER — Other Ambulatory Visit (HOSPITAL_BASED_OUTPATIENT_CLINIC_OR_DEPARTMENT_OTHER): Payer: BLUE CROSS/BLUE SHIELD

## 2015-02-25 ENCOUNTER — Ambulatory Visit: Payer: BLUE CROSS/BLUE SHIELD

## 2015-02-25 ENCOUNTER — Ambulatory Visit (HOSPITAL_BASED_OUTPATIENT_CLINIC_OR_DEPARTMENT_OTHER): Payer: BLUE CROSS/BLUE SHIELD | Admitting: Family

## 2015-02-25 ENCOUNTER — Encounter: Payer: Self-pay | Admitting: Family

## 2015-02-25 VITALS — BP 135/83 | HR 93 | Temp 98.3°F | Wt 235.0 lb

## 2015-02-25 DIAGNOSIS — D72829 Elevated white blood cell count, unspecified: Secondary | ICD-10-CM | POA: Insufficient documentation

## 2015-02-25 HISTORY — DX: Elevated white blood cell count, unspecified: D72.829

## 2015-02-25 LAB — TECHNOLOGIST REVIEW CHCC SATELLITE

## 2015-02-25 LAB — CBC WITH DIFFERENTIAL (CANCER CENTER ONLY)
BASO#: 0.1 10*3/uL (ref 0.0–0.2)
BASO%: 0.4 % (ref 0.0–2.0)
EOS%: 1 % (ref 0.0–7.0)
Eosinophils Absolute: 0.1 10*3/uL (ref 0.0–0.5)
HCT: 35.6 % (ref 34.8–46.6)
HGB: 12 g/dL (ref 11.6–15.9)
LYMPH#: 2.9 10*3/uL (ref 0.9–3.3)
LYMPH%: 26.2 % (ref 14.0–48.0)
MCH: 30.1 pg (ref 26.0–34.0)
MCHC: 33.7 g/dL (ref 32.0–36.0)
MCV: 89 fL (ref 81–101)
MONO#: 0.9 10*3/uL (ref 0.1–0.9)
MONO%: 7.8 % (ref 0.0–13.0)
NEUT#: 7.3 10*3/uL — ABNORMAL HIGH (ref 1.5–6.5)
NEUT%: 64.6 % (ref 39.6–80.0)
Platelets: 266 10*3/uL (ref 145–400)
RBC: 3.99 10*6/uL (ref 3.70–5.32)
RDW: 14.5 % (ref 11.1–15.7)
WBC: 11.2 10*3/uL — ABNORMAL HIGH (ref 3.9–10.0)

## 2015-02-25 LAB — CHCC SATELLITE - SMEAR

## 2015-02-25 NOTE — Progress Notes (Signed)
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 This appears to fluctuate . Her highest WBC count was 13.6 three years ago. Her count today is 11.2.  She has had no problems with infections. No fever, chills, n/v, cough, rash, dizziness, SOB, chest pain, palpitations, abdominal pain, change in bowel or bladder habits. No blood in urine or stool. No bruising.  She has some abdominal bloating.  She has chronic back pain and sees a pain specialist in [place]. She states that her back always feel "inflammed." She uses cold packs and natural methods to decrease inflammation. She prefers to stay away from narcotics. She states that she has gained 80 lbs in the past year.  She was diagnosed with pseudotumor cerebri 6 years ago after having blurred vision. She has had 3 spinal taps. She is also on lasix 40 mg daily.  She occassionally has some swelling in her hands and feet. She has had no numbness or tignling in her extremities. No new aches or pains.  She has and aunt and uncle with the sickle cell trait and their son has the disease.  Her cycles have been irregular lately. She has not had one in 2 months. She states that they are usually heavy.  Her mother, aunt and maternal grandmother have had breast cancer. Her paternal grandmother had colon cancer.  She does not smoke or drink alcohol. Her appetite is good and she is staying hydrated. As mentioned above she has had a great deal of weight gain.   ROS: All other 10 point review of systems is negative.   PAST MEDICAL HISTORY:   Past Medical History  Diagnosis Date  . Chronic  back pain   . Leukocytosis 02/25/2015    ALLERGIES: Allergies  Allergen Reactions  . Trazodone And Nefazodone Anaphylaxis  . Lortab [Hydrocodone-Acetaminophen] Itching  . Maxalt [Rizatriptan Benzoate] Other (See Comments)    Chest tightness   . Caine-1 [Lidocaine Hcl] Swelling and Rash  . Latex Rash      MEDICATIONS:  Current Outpatient Prescriptions on File Prior to Visit  Medication Sig Dispense Refill  . ALPRAZolam (XANAX) 1 MG tablet Take 1 mg by mouth at bedtime as needed.    . DULoxetine (CYMBALTA) 60 MG capsule Take 60 mg by mouth daily.    . furosemide (LASIX) 40 MG tablet TAKE ONE TABLET BY MOUTH EVERY DAY 30 tablet 6  . ibuprofen (ADVIL,MOTRIN) 200 MG tablet Take 800 mg by mouth every 6 (six) hours as needed. For inflammation    . Oxycodone HCl 10 MG TABS Take 10 mg by mouth every 6 (six) hours as needed.    . potassium chloride SA (K-DUR,KLOR-CON) 20 MEQ tablet Take 1 tablet (20 mEq total) by mouth daily. 30 tablet 6  . promethazine (PHENERGAN) 25 MG tablet Take 25 mg by mouth daily.     No current facility-administered medications on file prior to visit.     PAST SURGICAL HISTORY No past surgical history on file.  FAMILY HISTORY: No family history on file.  SOCIAL HISTORY:  reports that she has never smoked. She does not have any  smokeless tobacco history on file. She reports that she drinks alcohol. She reports that she does not use illicit drugs.  PERFORMANCE STATUS: The patient's performance status is 0 - Asymptomatic  PHYSICAL EXAM: Most Recent Vital Signs: There were no vitals taken for this visit. BP 135/83 mmHg  Pulse 93  Temp(Src) 98.3 F (36.8 C) (Oral)  Wt 235 lb (106.595 kg)  General Appearance:    Alert, cooperative, no distress, appears stated age  Head:    Normocephalic, without obvious abnormality, atraumatic  Eyes:    PERRL, conjunctiva/corneas clear, EOM's intact, fundi    benign, both eyes        Throat:   Lips, mucosa, and tongue  normal; teeth and gums normal  Neck:   Supple, symmetrical, trachea midline, no adenopathy;    thyroid:  no enlargement/tenderness/nodules; no carotid   bruit or JVD  Back:     Symmetric, no curvature, ROM normal, no CVA tenderness  Lungs:     Clear to auscultation bilaterally, respirations unlabored  Chest Wall:    No tenderness or deformity   Heart:    Regular rate and rhythm, S1 and S2 normal, no murmur, rub   or gallop     Abdomen:     Soft, non-tender, bowel sounds active all four quadrants,    no masses, no organomegaly        Extremities:   Extremities normal, atraumatic, no cyanosis or edema  Pulses:   2+ and symmetric all extremities  Skin:   Skin color, texture, turgor normal, no rashes or lesions  Lymph nodes:   Cervical, supraclavicular, and axillary nodes normal  Neurologic:   CNII-XII intact, normal strength, sensation and reflexes    throughout   LABORATORY DATA:  No results found for this or any previous visit (from the past 48 hour(s)).    RADIOGRAPHY: No results found.     PATHOLOGY: None  ASSESSMENT/PLAN: Ms. Alton is a very pleasant 34 yo [place] American female with a history of elevated WBC counts. Her WBC count today is 11.2. She has no problem with infections.  She has chronic back pain and inflammation. Her count is likely reactive and due to this issue.   She is not anemic. Her platelet count and CBC differential are within normal limits.   Her blood smear was viewed by Dr. Marin Olp and showed no abnormality or evidence of malignancy.  We do not need to have her follow-up with [place]. She is welcome to come back and see [place] for any future hematologic issues.  All questions were answered. She knows to call the clinic with any problems, questions or concerns.  The patient was discussed with and also seen by Dr. Marin Olp and he is in agreement with the aforementioned.   [facility] M    Addendum:  I saw and examined the patient with Tracey Beard. I looked at  her blood smear. She had good x-ray, white blood cells. I do not see any immature myeloid cells. There were no hypersegmented polys. There were no blasts. Red cell morphology appeared normal. She had well granulated platelets.  There is absolute no indication that she has leukemia or other bone marrow disorder. I suspect that the leukocytosis is reactive. Leukocytosis is minimal at best. I think this may tend to no up and down. However, since she is asymptomatic, I really don't think that he have to pursue any invasive studies.  I spent her that she is not anemic or has platelets issues, then we  probably can just watch her and actually did not have her come back to the clinic.  She is very nice. She comes with her mom.  She wants to get pregnant. I don't see any issues with her getting pregnant from a hematologic point of view.  We spent about 45 minutes with her today. We answered all her questions and reassured her more than anything else.  Tracey Beard

## 2015-04-09 ENCOUNTER — Other Ambulatory Visit: Payer: Self-pay | Admitting: Neurology

## 2015-04-09 ENCOUNTER — Telehealth: Payer: Self-pay | Admitting: Neurology

## 2015-04-09 NOTE — Telephone Encounter (Signed)
Pt is calling back to see what the status of the medication  She states that she is not feeling good. Hands and feet are swelling and she is having really bad migraines swishing sounds in the ear. Eyes swelling also. Please call [phone]

## 2015-04-09 NOTE — Telephone Encounter (Signed)
Is she still taking Lasix 40mg  daily? I would not go above this. Not quite sure what the swelling is from, would have her follow-up with PCP for this. She can come in for Toradol injection for the headache at this time, but would have the swelling checked by PCP. Thanks

## 2015-04-09 NOTE — Telephone Encounter (Signed)
Kim from/ [facility], called about a "Percocet" prescription for: Tracey Beard. [place]/ call back PH# [phone]

## 2015-04-09 NOTE — Telephone Encounter (Signed)
I spoke with patient. She states that she has had an increase in the pressure in her head. Hands, feet, & ankles really swollen. Swishing sound in her ear. Really bad migraine. Swelling behind the eye. Any advisement about medication changes.

## 2015-04-09 NOTE — Telephone Encounter (Signed)
VM-Pt called and stated she needed a call back because she was not feeling well and wanted to know if it could be her medication, she did not state the med in the voicemail/Dawn CB# [phone]/NID has an appointment on Monday

## 2015-04-09 NOTE — Telephone Encounter (Signed)
If taking the 20mg  tabs, increase to 40mg  daily. If on 40mg , would not change. Thanks

## 2015-04-09 NOTE — Telephone Encounter (Signed)
I spoke with patient. She states that she thinks her pill bottle says 20 mg which is what she's been taking everyday. I did explain that she is supposed to be on 40 mg daily & her last Rx & refills was for 40 mg daily. She is going to double check her bottle when she gets home and call me back to let me know for sure. She is also going to come in for the Toradol injection today. If she has been actually taking the 20 mg do you have any other advisement?

## 2015-04-12 ENCOUNTER — Ambulatory Visit: Payer: BLUE CROSS/BLUE SHIELD | Admitting: Neurology

## 2015-04-12 NOTE — Telephone Encounter (Signed)
Patient no showed appt today

## 2015-04-13 ENCOUNTER — Encounter: Payer: Self-pay | Admitting: *Deleted

## 2015-04-13 ENCOUNTER — Telehealth: Payer: Self-pay | Admitting: *Deleted

## 2015-04-13 NOTE — Telephone Encounter (Signed)
No show letter sent for missed appointment on 04/12/2015

## 2015-07-01 ENCOUNTER — Other Ambulatory Visit: Payer: Self-pay | Admitting: Neurology

## 2015-08-14 DIAGNOSIS — C73 Malignant neoplasm of thyroid gland: Secondary | ICD-10-CM | POA: Insufficient documentation

## 2015-08-25 DIAGNOSIS — R7303 Prediabetes: Secondary | ICD-10-CM | POA: Insufficient documentation

## 2015-09-21 DIAGNOSIS — G4761 Periodic limb movement disorder: Secondary | ICD-10-CM | POA: Insufficient documentation

## 2015-09-21 DIAGNOSIS — G4710 Hypersomnia, unspecified: Secondary | ICD-10-CM | POA: Insufficient documentation

## 2015-09-21 DIAGNOSIS — R0683 Snoring: Secondary | ICD-10-CM | POA: Insufficient documentation

## 2015-11-11 ENCOUNTER — Other Ambulatory Visit: Payer: Self-pay | Admitting: Neurology

## 2015-11-12 NOTE — Telephone Encounter (Signed)
Refill denied. Not seen since 06/2014.

## 2016-01-13 ENCOUNTER — Encounter (HOSPITAL_COMMUNITY): Payer: Self-pay | Admitting: Emergency Medicine

## 2016-01-13 ENCOUNTER — Emergency Department (HOSPITAL_COMMUNITY)
Admission: EM | Admit: 2016-01-13 | Discharge: 2016-01-14 | Disposition: A | Discharge status: Home / Self Care | Payer: Managed Care, Other (non HMO) | Attending: Emergency Medicine | Admitting: Emergency Medicine

## 2016-01-13 DIAGNOSIS — R519 Headache, unspecified: Secondary | ICD-10-CM

## 2016-01-13 DIAGNOSIS — G932 Benign intracranial hypertension: Secondary | ICD-10-CM | POA: Diagnosis not present

## 2016-01-13 DIAGNOSIS — G971 Other reaction to spinal and lumbar puncture: Secondary | ICD-10-CM | POA: Diagnosis not present

## 2016-01-13 DIAGNOSIS — Z91040 Latex allergy status: Secondary | ICD-10-CM | POA: Insufficient documentation

## 2016-01-13 DIAGNOSIS — G8929 Other chronic pain: Secondary | ICD-10-CM | POA: Diagnosis not present

## 2016-01-13 DIAGNOSIS — R51 Headache: Secondary | ICD-10-CM

## 2016-01-13 DIAGNOSIS — Z3202 Encounter for pregnancy test, result negative: Secondary | ICD-10-CM | POA: Diagnosis not present

## 2016-01-13 DIAGNOSIS — Z862 Personal history of diseases of the blood and blood-forming organs and certain disorders involving the immune mechanism: Secondary | ICD-10-CM | POA: Insufficient documentation

## 2016-01-13 DIAGNOSIS — Z79899 Other long term (current) drug therapy: Secondary | ICD-10-CM | POA: Diagnosis not present

## 2016-01-13 MED ORDER — DIPHENHYDRAMINE HCL 50 MG/ML IJ SOLN
25.0000 mg | Freq: Once | INTRAMUSCULAR | Status: AC
  Administered 2016-01-14: 25 mg via INTRAVENOUS
  Filled 2016-01-13: qty 1

## 2016-01-13 MED ORDER — KETOROLAC TROMETHAMINE 15 MG/ML IJ SOLN
15.0000 mg | Freq: Once | INTRAMUSCULAR | Status: AC
  Administered 2016-01-14: 15 mg via INTRAVENOUS
  Filled 2016-01-13: qty 1

## 2016-01-13 MED ORDER — METOCLOPRAMIDE HCL 5 MG/ML IJ SOLN
10.0000 mg | Freq: Once | INTRAMUSCULAR | Status: AC
  Administered 2016-01-14: 10 mg via INTRAVENOUS
  Filled 2016-01-13: qty 2

## 2016-01-13 MED ORDER — OXYCODONE-ACETAMINOPHEN 5-325 MG PO TABS
1.0000 | ORAL_TABLET | ORAL | Status: DC | PRN
  Administered 2016-01-13: 1 via ORAL
  Filled 2016-01-13: qty 1

## 2016-01-13 MED ORDER — LORAZEPAM 2 MG/ML IJ SOLN
1.0000 mg | Freq: Once | INTRAMUSCULAR | Status: AC
  Administered 2016-01-14: 1 mg via INTRAVENOUS
  Filled 2016-01-13: qty 1

## 2016-01-13 NOTE — ED Provider Notes (Signed)
CSN: [id number]     Arrival date & time 01/13/16  1944 History  By signing my name below, I, [facility], attest that this documentation has been prepared under the direction and in the presence of Varney Biles, MD. Electronically Signed: Virgel Bouquet, ED Scribe. 01/14/2016. 2:20 AM.   Chief Complaint  Patient presents with  . Headache  . Blurred Vision   The history is provided by the patient. No language interpreter was used.   HPI Comments: Tracey Beard is a 35 y.o. female with an hx of leukocytosis and pseudotumor cerebri who presents to the Emergency Department complaining of constant, gradually worsening, moderate HA onset 5 days ago. Patient reports visual blurriness, worse on the right then the left, posterior neck pain. HA worse with lying flat. Vision improves and is clear when only one eye is open at a time. She has taken ibuprofen and Tylenol without relief. She has had spinal taps 2 years ago at [place]. She notes similar symptoms in the past. Denies recent lumbar punctures. Denies seeing her neurologist recently. Denies any other symptoms currently. Allergic to caine medications.  Past Medical History  Diagnosis Date  . Chronic back pain   . Leukocytosis 02/25/2015   History reviewed. No pertinent past surgical history. No family history on file. Social History  Substance Use Topics  . Smoking status: Never Smoker   . Smokeless tobacco: None  . Alcohol Use: Yes     Comment: socially   OB History    No data available     Review of Systems A complete 10 system review of systems was obtained and all systems are negative except as noted in the HPI and PMH.   Allergies  Trazodone and nefazodone; Lortab; Maxalt; Caine-1; and Latex  Home Medications   Prior to Admission medications   Medication Sig Start Date End Date Taking? Authorizing Provider  acetaminophen (TYLENOL) 500 MG tablet Take 1,000 mg by mouth every 6 (six) hours as needed for  mild pain, moderate pain or headache.   Yes Historical Provider, MD  esomeprazole (NEXIUM) 20 MG capsule Take 20 mg by mouth daily at 12 noon.   Yes Historical Provider, MD  furosemide (LASIX) 40 MG tablet TAKE ONE TABLET BY MOUTH EVERY DAY Patient taking differently: TAKE 40 MG BY MOUTH EVERY DAY 07/13/14  Yes Cameron Sprang, MD  ibuprofen (ADVIL,MOTRIN) 200 MG tablet Take 800 mg by mouth every 6 (six) hours as needed. For inflammation   Yes Historical Provider, MD  potassium chloride SA (K-DUR,KLOR-CON) 20 MEQ tablet Take 1 tablet (20 mEq total) by mouth daily. 07/13/14  Yes Cameron Sprang, MD   BP 141/89 mmHg  Pulse 86  Temp(Src) 98.9 F (37.2 C) (Oral)  Resp 21  SpO2 98%  LMP 11/23/2015 (Approximate) Physical Exam  Constitutional: She is oriented to person, place, and time. She appears well-developed and well-nourished.  HENT:  Head: Normocephalic.  Eyes: EOM are normal. Pupils are equal, round, and reactive to light.  Pupils 6 mm bilaterally. No nystagmus.   Neck: Normal range of motion.  Pulmonary/Chest: Effort normal.  Abdominal: She exhibits no distension.  Musculoskeletal: Normal range of motion.  Neurological: She is alert and oriented to person, place, and time. No cranial nerve deficit. Coordination normal.  Grip strength equal bilaterally. Cranial nerve II-XII intact. On upper sensory exam, upper extremities equal and normal. Cerebellar exam reveals no dysmetria.    Psychiatric: She has a normal mood and affect.  Nursing note  and vitals reviewed.   ED Course  Procedures  DIAGNOSTIC STUDIES: Oxygen Saturation is 98% on RA, normal by my interpretation.    COORDINATION OF CARE: 11:42 PM Discussed benefits and risks of treating in the ED tonight or following up with her neurologist tomorrow. Will order IV fluids, Reglan, Benadryl, Ativan, and Toradol. Advised pt to follow-up with neurologist. Discussed treatment plan with pt at bedside and pt agreed to plan.  1:05 AM  Returned to re-evaluate pt. Pt has persistent HAs. No improvement. Spoke with radiology to schedule appointment for later today.   1:58 AM Returned to re-evaluate pt. Pt has slightly improved. Discussed performing lumbar puncture in ED today and pt declined stating that she wished to have this procedure performed by radiology. Discussed options with pt and pt and I agreed to discharge and return for follow-up appointment later today. Will order IM pain medication at pt's request.  MDM   Final diagnoses:  Severe headache  Idiopathic intracranial hypertension    Medical screening examination/treatment/procedure(s) were performed by me as the supervising physician. Scribe service was utilized for documentation only.  Pt comes in with cc of headaches with blurry vision. She has hx of IIH, and the symptoms that have gradually progressed over the past few days are consistent. No focal neuro deficits. Pain meds given, no effects. Offered therapeutic LP now vs. Outpatient procedure under fluoro, and pt prefers the latter due to comfort and higher success rate. Called our Radiology team and spoke with Dr. Dellis Filbert and Lonn Georgia the Xray tech - and they will have the Radiology dept call patient for the procedure.   Varney Biles, MD 01/14/16 1845

## 2016-01-13 NOTE — ED Notes (Signed)
Patient presents for headache, blurred vision, bilateral hand swelling, bilateral ankle swelling, posterior neck pain, 2-3 days. History of pseudotumor cerebri and is concerned the pressure in head is increasing. Denies difficulty speaking/swallowing, weakness, decreased sensation, neurologically intact. No relief with OTC medications. Rates pain 10/10.

## 2016-01-14 ENCOUNTER — Emergency Department (HOSPITAL_COMMUNITY): Payer: Managed Care, Other (non HMO)

## 2016-01-14 ENCOUNTER — Ambulatory Visit (HOSPITAL_COMMUNITY)
Admission: RE | Admit: 2016-01-14 | Discharge: 2016-01-14 | Disposition: A | Discharge status: Home / Self Care | Payer: Managed Care, Other (non HMO) | Source: Ambulatory Visit | Attending: Interventional Radiology | Admitting: Interventional Radiology

## 2016-01-14 ENCOUNTER — Ambulatory Visit (HOSPITAL_COMMUNITY)
Admission: RE | Admit: 2016-01-14 | Discharge: 2016-01-14 | Disposition: A | Discharge status: Home / Self Care | Payer: Managed Care, Other (non HMO) | Source: Ambulatory Visit | Attending: Emergency Medicine | Admitting: Emergency Medicine

## 2016-01-14 ENCOUNTER — Emergency Department (HOSPITAL_COMMUNITY)
Admission: EM | Admit: 2016-01-14 | Discharge: 2016-01-14 | Disposition: A | Discharge status: Home / Self Care | Payer: Managed Care, Other (non HMO) | Attending: Emergency Medicine | Admitting: Emergency Medicine

## 2016-01-14 ENCOUNTER — Encounter (HOSPITAL_COMMUNITY): Payer: Self-pay | Admitting: *Deleted

## 2016-01-14 ENCOUNTER — Other Ambulatory Visit (HOSPITAL_COMMUNITY): Payer: Self-pay | Admitting: Emergency Medicine

## 2016-01-14 DIAGNOSIS — M542 Cervicalgia: Secondary | ICD-10-CM | POA: Diagnosis not present

## 2016-01-14 DIAGNOSIS — R519 Headache, unspecified: Secondary | ICD-10-CM

## 2016-01-14 DIAGNOSIS — Z791 Long term (current) use of non-steroidal anti-inflammatories (NSAID): Secondary | ICD-10-CM | POA: Diagnosis not present

## 2016-01-14 DIAGNOSIS — G932 Benign intracranial hypertension: Secondary | ICD-10-CM | POA: Insufficient documentation

## 2016-01-14 DIAGNOSIS — Z91040 Latex allergy status: Secondary | ICD-10-CM | POA: Diagnosis not present

## 2016-01-14 DIAGNOSIS — Z79891 Long term (current) use of opiate analgesic: Secondary | ICD-10-CM | POA: Diagnosis not present

## 2016-01-14 DIAGNOSIS — Z79899 Other long term (current) drug therapy: Secondary | ICD-10-CM | POA: Diagnosis not present

## 2016-01-14 DIAGNOSIS — G971 Other reaction to spinal and lumbar puncture: Secondary | ICD-10-CM | POA: Insufficient documentation

## 2016-01-14 DIAGNOSIS — R51 Headache: Secondary | ICD-10-CM

## 2016-01-14 DIAGNOSIS — G43909 Migraine, unspecified, not intractable, without status migrainosus: Secondary | ICD-10-CM | POA: Diagnosis present

## 2016-01-14 HISTORY — DX: Headache, unspecified: R51.9

## 2016-01-14 HISTORY — DX: Headache: R51

## 2016-01-14 LAB — BASIC METABOLIC PANEL
Anion gap: 11 (ref 5–15)
BUN: 9 mg/dL (ref 6–20)
CO2: 26 mmol/L (ref 22–32)
Calcium: 9.9 mg/dL (ref 8.9–10.3)
Chloride: 106 mmol/L (ref 101–111)
Creatinine, Ser: 0.75 mg/dL (ref 0.44–1.00)
GFR calc Af Amer: >60 mL/min (ref >60)
GFR calc non Af Amer: >60 mL/min (ref >60)
Glucose, Bld: 138 mg/dL — ABNORMAL HIGH (ref 65–99)
Potassium: 3.7 mmol/L (ref 3.5–5.1)
Sodium: 143 mmol/L (ref 135–145)

## 2016-01-14 LAB — CBC
HCT: 37.1 % (ref 36.0–46.0)
Hemoglobin: 12.6 g/dL (ref 12.0–15.0)
MCH: 28.3 pg (ref 26.0–34.0)
MCHC: 34 g/dL (ref 30.0–36.0)
MCV: 83.2 fL (ref 78.0–100.0)
Platelets: 373 10*3/uL (ref 150–400)
RBC: 4.46 MIL/uL (ref 3.87–5.11)
RDW: 18.4 % — ABNORMAL HIGH (ref 11.5–15.5)
WBC: 15.8 10*3/uL — ABNORMAL HIGH (ref 4.0–10.5)

## 2016-01-14 LAB — CBC WITH DIFFERENTIAL/PLATELET
Basophils Absolute: 0 10*3/uL (ref 0.0–0.1)
Basophils Relative: 0 %
Eosinophils Absolute: 0 10*3/uL (ref 0.0–0.7)
Eosinophils Relative: 0 %
HCT: 35.3 % — ABNORMAL LOW (ref 36.0–46.0)
Hemoglobin: 11.8 g/dL — ABNORMAL LOW (ref 12.0–15.0)
Lymphocytes Relative: 12 %
Lymphs Abs: 1.4 10*3/uL (ref 0.7–4.0)
MCH: 28.2 pg (ref 26.0–34.0)
MCHC: 33.4 g/dL (ref 30.0–36.0)
MCV: 84.4 fL (ref 78.0–100.0)
Monocytes Absolute: 0.4 10*3/uL (ref 0.1–1.0)
Monocytes Relative: 3 %
Neutro Abs: 10.4 10*3/uL — ABNORMAL HIGH (ref 1.7–7.7)
Neutrophils Relative %: 85 %
Platelets: 383 10*3/uL (ref 150–400)
RBC: 4.18 MIL/uL (ref 3.87–5.11)
RDW: 18.5 % — ABNORMAL HIGH (ref 11.5–15.5)
WBC: 12.2 10*3/uL — ABNORMAL HIGH (ref 4.0–10.5)

## 2016-01-14 LAB — I-STAT BETA HCG BLOOD, ED (MC, WL, AP ONLY): I-stat hCG, quantitative: <5 m[IU]/mL (ref <5)

## 2016-01-14 MED ORDER — SODIUM CHLORIDE 0.9 % IV BOLUS (SEPSIS)
500.0000 mL | Freq: Once | INTRAVENOUS | Status: AC
  Administered 2016-01-14: 500 mL via INTRAVENOUS

## 2016-01-14 MED ORDER — HYDROMORPHONE HCL 1 MG/ML IJ SOLN
1.0000 mg | Freq: Once | INTRAMUSCULAR | Status: AC
  Administered 2016-01-14: 1 mg via INTRAVENOUS
  Filled 2016-01-14: qty 1

## 2016-01-14 MED ORDER — SODIUM CHLORIDE 0.9 % IV BOLUS (SEPSIS)
1000.0000 mL | Freq: Once | INTRAVENOUS | Status: AC
Start: 2016-01-14 — End: 2016-01-14
  Administered 2016-01-14: 1000 mL via INTRAVENOUS

## 2016-01-14 MED ORDER — ONDANSETRON HCL 4 MG PO TABS: 4.0000 mg | ORAL_TABLET | Freq: Four times a day (QID) | ORAL | Status: DC

## 2016-01-14 MED ORDER — DEXAMETHASONE SODIUM PHOSPHATE 10 MG/ML IJ SOLN
10.0000 mg | Freq: Once | INTRAMUSCULAR | Status: AC
  Administered 2016-01-14: 10 mg via INTRAVENOUS
  Filled 2016-01-14: qty 1

## 2016-01-14 MED ORDER — HYDROCODONE-ACETAMINOPHEN 7.5-325 MG PO TABS: 1.0000 | ORAL_TABLET | Freq: Four times a day (QID) | ORAL | Status: DC | PRN

## 2016-01-14 MED ORDER — IBUPROFEN 800 MG PO TABS
800.0000 mg | ORAL_TABLET | ORAL | Status: DC
  Filled 2016-01-14: qty 1

## 2016-01-14 MED ORDER — ONDANSETRON HCL 4 MG/2ML IJ SOLN
4.0000 mg | Freq: Once | INTRAMUSCULAR | Status: AC
  Administered 2016-01-14: 4 mg via INTRAVENOUS
  Filled 2016-01-14: qty 2

## 2016-01-14 MED ORDER — TRAMADOL HCL 50 MG PO TABS: 50.0000 mg | ORAL_TABLET | Freq: Four times a day (QID) | ORAL | Status: DC | PRN

## 2016-01-14 MED ORDER — HYDROMORPHONE HCL 2 MG/ML IJ SOLN
2.0000 mg | Freq: Once | INTRAMUSCULAR | Status: AC
  Administered 2016-01-14: 2 mg via INTRAMUSCULAR
  Filled 2016-01-14: qty 1

## 2016-01-14 NOTE — ED Notes (Signed)
Pt reports severe h/a s/p LP under flouro.

## 2016-01-14 NOTE — ED Provider Notes (Signed)
CSN: [id number]     Arrival date & time 01/14/16  1151 History   First MD Initiated Contact with Patient 01/14/16 1229     Chief Complaint  Patient presents with  . Migraine     (Consider location/radiation/quality/duration/timing/severity/associated sxs/prior Treatment) HPI.Marland KitchenMarland KitchenMarland KitchenPatient was seen in the emergency department last night for a headache presumably secondary to her pseudotumor cerebri. She was sent to the interventional radiologist for a lumbar puncture this morning.  This procedure was performed. She was then sent back to the emergency department for persistent headache. She apparently sees a neurologist at [facility].  No fever, chills, stiff neck, neurodeficits, visual changes.  Headache is described as "all over my head".  Past Medical History  Diagnosis Date  . Chronic back pain   . Leukocytosis 02/25/2015  . Headache    History reviewed. No pertinent past surgical history. No family history on file. Social History  Substance Use Topics  . Smoking status: Never Smoker   . Smokeless tobacco: None  . Alcohol Use: Yes     Comment: socially   OB History    No data available     Review of Systems  All other systems reviewed and are negative.     Allergies  Trazodone and nefazodone; Diamox; Lortab; Maxalt; Caine-1; and Latex  Home Medications   Prior to Admission medications   Medication Sig Start Date End Date Taking? Authorizing Provider  acetaminophen (TYLENOL) 500 MG tablet Take 1,000 mg by mouth every 4 (four) hours as needed for mild pain, moderate pain or headache.    Yes Historical Provider, MD  BIOTIN PO Take 1 tablet by mouth daily.   Yes Historical Provider, MD  esomeprazole (NEXIUM) 20 MG capsule Take 20 mg by mouth daily at 12 noon.   Yes Historical Provider, MD  furosemide (LASIX) 40 MG tablet TAKE ONE TABLET BY MOUTH EVERY DAY Patient taking differently: TAKE 40 MG BY MOUTH EVERY DAY 07/13/14  Yes Cameron Sprang, MD  ibuprofen (ADVIL,MOTRIN) 200 MG  tablet Take 800 mg by mouth every 6 (six) hours as needed. For inflammation   Yes Historical Provider, MD  Multiple Vitamins-Minerals (MULTIVITAMIN & MINERAL PO) Take 1 tablet by mouth daily.   Yes Historical Provider, MD  potassium chloride SA (K-DUR,KLOR-CON) 20 MEQ tablet Take 1 tablet (20 mEq total) by mouth daily. 07/13/14  Yes Cameron Sprang, MD  HYDROcodone-acetaminophen (NORCO) 7.5-325 MG tablet Take 1 tablet by mouth every 6 (six) hours as needed for moderate pain. 01/14/16   Nat Christen, MD  ondansetron (ZOFRAN) 4 MG tablet Take 1 tablet (4 mg total) by mouth every 6 (six) hours. 01/14/16   Nat Christen, MD  ondansetron (ZOFRAN) 4 MG tablet Take 1 tablet (4 mg total) by mouth every 6 (six) hours. 01/14/16   Nat Christen, MD  traMADol (ULTRAM) 50 MG tablet Take 1 tablet (50 mg total) by mouth every 6 (six) hours as needed. 01/14/16   Nat Christen, MD  traMADol (ULTRAM) 50 MG tablet Take 1 tablet (50 mg total) by mouth every 6 (six) hours as needed. 01/14/16   Nat Christen, MD   BP 145/92 mmHg  Pulse 94  Temp(Src) 98 F (36.7 C) (Oral)  Resp 14  Ht 5\' 1"  (1.549 m)  Wt 200 lb (90.719 kg)  BMI 37.81 kg/m2  SpO2 99%  LMP 12/21/2015 Physical Exam  Constitutional: She is oriented to person, place, and time.  Photophobic  HENT:  Head: Normocephalic and atraumatic.  Eyes: Conjunctivae and EOM  are normal. Pupils are equal, round, and reactive to light.  Neck: Normal range of motion. Neck supple.  No meningeal signs  Cardiovascular: Normal rate and regular rhythm.   Pulmonary/Chest: Effort normal and breath sounds normal.  Abdominal: Soft. Bowel sounds are normal.  Musculoskeletal: Normal range of motion.  Neurological: She is alert and oriented to person, place, and time.  Skin: Skin is warm and dry.  Psychiatric: She has a normal mood and affect. Her behavior is normal.  Nursing note and vitals reviewed.   ED Course  Procedures (including critical care time) Labs Review Labs Reviewed  CBC  WITH DIFFERENTIAL/PLATELET - Abnormal; Notable for the following:    WBC 12.2 (*)    Hemoglobin 11.8 (*)    HCT 35.3 (*)    RDW 18.5 (*)    Neutro Abs 10.4 (*)    All other components within normal limits  BASIC METABOLIC PANEL - Abnormal; Notable for the following:    Glucose, Bld 138 (*)    All other components within normal limits    Imaging Review No results found. I have personally reviewed and evaluated these images and lab results as part of my medical decision-making.   EKG Interpretation None      MDM   Final diagnoses:  Headache, unspecified headache type    Patient given IV fluids and pain management with Dilaudid and Zofran. Vital signs are stable. No neurological deficits at discharge. Discharge medication hydrocodone/acetaminophen 7.5/325    Nat Christen, MD 01/17/16 1245

## 2016-01-14 NOTE — Discharge Instructions (Signed)
CT scan of head was normal. Medications for pain and nausea. Rest. Increase fluids. Return for fever, stiff neck, any worsening concerns

## 2016-01-14 NOTE — Discharge Instructions (Signed)
Lumbar Puncture, Care After °Refer to this sheet in the next few weeks. These instructions provide you with information on caring for yourself after your procedure. Your health care provider may also give you more specific instructions. Your treatment has been planned according to current medical practices, but problems sometimes occur. Call your health care provider if you have any problems or questions after your procedure. °WHAT TO EXPECT AFTER THE PROCEDURE °After your procedure, it is typical to have the following sensations: °· Mild discomfort or pain at the insertion site. °· Mild headache that is relieved with pain medicines. °HOME CARE INSTRUCTIONS °· Avoid lifting anything heavier than 10 lb (4.5 kg) for at least 12 hours after the procedure. °· Drink enough fluids to keep your urine clear or pale yellow. °SEEK MEDICAL CARE IF: °· You have fever or chills. °· You have nausea or vomiting. °· You have a headache that lasts for more than 2 days. °SEEK IMMEDIATE MEDICAL CARE IF: °· You have any numbness or tingling in your legs. °· You are unable to control your bowel or bladder. °· You have bleeding or swelling in your back at the insertion site. °· You are dizzy or faint. °  °This information is not intended to replace advice given to you by your health care provider. Make sure you discuss any questions you have with your health care provider. °  °Document Released: 09/09/2013 Document Reviewed: 09/09/2013 °Elsevier Interactive Patient Education ©2016 Elsevier Inc. ° °

## 2016-01-14 NOTE — Progress Notes (Signed)
Order received for Ibuprofen from DR. Kris Hartmann, pt. Mother just arrived, instructed pt. And pt's mother that was all the pain medication that I could give, pt and pt's mother requested to go to the ER. Dr. Kris Hartmann notified of pt. Going to ER, pt. Taken to ER via Biomedical scientist.

## 2016-01-14 NOTE — ED Notes (Signed)
Pt reports coming in this am for an out-pt LP under flouro.  States while having the radiologist was aspirating CSF she started to have sharp pain in her head and asked if this was normal, she states that the doctor told her that this is normal and that she will be feeling this for the next 2 days.  She states that she has had several LP's in the past and has never experienced this before.  Pt is very concerned.  Denies any blurred vision or any other neuro deficits at this time.  Pt was laying on her R side, was instructed to lay on her back.  She states that she was told that it was okay for her to lay side ways as long as she was laying down.  But pt turned on her back.  Pt is A&Ox 4 at this time.  Pt also reports neck pain along with her h/a.

## 2016-01-14 NOTE — ED Notes (Signed)
Family at bedside (Mother)

## 2016-01-14 NOTE — Discharge Instructions (Signed)
The Radiologist will call you tomorrow for an appointment on the LP. Please get the necessary information from [facility] for the procedure to be completed promptly.    Idiopathic Intracranial Hypertension Idiopathic intracranial hypertension (IIH) is a neurologic disorder that leads to increased pressure around your brain. It can cause vision loss and blindness if left untreated. RISK FACTORS IIH is most common in very overweight (obese) women of childbearing age. SIGNS AND SYMPTOMS  Symptoms of IIH include:  Headache.  Feeling of sickness in your stomach (nausea).  Vomiting.  A "rushing of water" sound within your ears (pulsatile tinnitus).  Double vision. DIAGNOSIS  Idiopathic intracranial hypertension is diagnosed with the aid of different exams:  Brain scans such as:  CT.  MRI.  MRV.  Diagnostic lumbar puncture. This procedure can determine if there is too much spinal fluid within the central nervous system. Too much spinal fluid can increase intracranial pressure.  A thorough eye exam will be done to look for swelling within the eyes. Visual field testing will also be done to see if any damage has occurred to nerves in the eyes. TREATMENT  Treatment of idiopathic intracranial hypertension is based on symptoms. Common treatments include:  Lumbar puncture to remove excess spinal fluid.  Medicine.  Surgery. HOME CARE INSTRUCTIONS The most important thing anyone can do to improve this condition is lose weight if they are overweight.  SEEK MEDICAL CARE IF:  You have changes in vision.  You have double vision.  You have loss of color vision. SEEK IMMEDIATE MEDICAL CARE IF:   Your headaches get worse rather than better.  Nausea or vomiting or both continue after treatment.  Your vision does not improve or gets worse after treatment. MAKE SURE YOU:  Understand these instructions.  Will watch your condition.  Will get help right away if you are not doing  well or get worse.   This information is not intended to replace advice given to you by your health care provider. Make sure you discuss any questions you have with your health care provider.   Document Released: 11/13/2001 Document Revised: 09/09/2013 Document Reviewed: 05/12/2013 Elsevier Interactive Patient Education [employer].

## 2016-01-14 NOTE — ED Notes (Signed)
Patient transported to CT 

## 2016-01-14 NOTE — ED Notes (Signed)
Pt turned over, laying prone

## 2016-01-15 ENCOUNTER — Other Ambulatory Visit: Payer: Self-pay | Admitting: Neurology

## 2016-01-17 ENCOUNTER — Emergency Department (HOSPITAL_COMMUNITY)
Admission: EM | Admit: 2016-01-17 | Discharge: 2016-01-17 | Disposition: A | Discharge status: Home / Self Care | Payer: Managed Care, Other (non HMO) | Attending: Emergency Medicine | Admitting: Emergency Medicine

## 2016-01-17 ENCOUNTER — Encounter (HOSPITAL_COMMUNITY): Payer: Self-pay

## 2016-01-17 ENCOUNTER — Telehealth: Payer: Self-pay | Admitting: Neurology

## 2016-01-17 DIAGNOSIS — F329 Major depressive disorder, single episode, unspecified: Secondary | ICD-10-CM | POA: Diagnosis not present

## 2016-01-17 DIAGNOSIS — M542 Cervicalgia: Secondary | ICD-10-CM | POA: Insufficient documentation

## 2016-01-17 DIAGNOSIS — Z79891 Long term (current) use of opiate analgesic: Secondary | ICD-10-CM | POA: Insufficient documentation

## 2016-01-17 DIAGNOSIS — G971 Other reaction to spinal and lumbar puncture: Secondary | ICD-10-CM

## 2016-01-17 DIAGNOSIS — Z791 Long term (current) use of non-steroidal anti-inflammatories (NSAID): Secondary | ICD-10-CM | POA: Insufficient documentation

## 2016-01-17 DIAGNOSIS — Z79899 Other long term (current) drug therapy: Secondary | ICD-10-CM | POA: Insufficient documentation

## 2016-01-17 DIAGNOSIS — H538 Other visual disturbances: Secondary | ICD-10-CM | POA: Diagnosis present

## 2016-01-17 DIAGNOSIS — Z91040 Latex allergy status: Secondary | ICD-10-CM | POA: Diagnosis not present

## 2016-01-17 LAB — COMPREHENSIVE METABOLIC PANEL
ALT: 29 U/L (ref 14–54)
AST: 18 U/L (ref 15–41)
Albumin: 4.4 g/dL (ref 3.5–5.0)
Alkaline Phosphatase: 62 U/L (ref 38–126)
Anion gap: 12 (ref 5–15)
BUN: 10 mg/dL (ref 6–20)
CO2: 28 mmol/L (ref 22–32)
Calcium: 9.7 mg/dL (ref 8.9–10.3)
Chloride: 102 mmol/L (ref 101–111)
Creatinine, Ser: 0.82 mg/dL (ref 0.44–1.00)
GFR calc Af Amer: >60 mL/min (ref >60)
GFR calc non Af Amer: >60 mL/min (ref >60)
Glucose, Bld: 116 mg/dL — ABNORMAL HIGH (ref 65–99)
Potassium: 3.5 mmol/L (ref 3.5–5.1)
Sodium: 142 mmol/L (ref 135–145)
Total Bilirubin: 0.3 mg/dL (ref 0.3–1.2)
Total Protein: 8 g/dL (ref 6.5–8.1)

## 2016-01-17 LAB — CBC WITH DIFFERENTIAL/PLATELET
Basophils Absolute: 0 10*3/uL (ref 0.0–0.1)
Basophils Relative: 0 %
Eosinophils Absolute: 0 10*3/uL (ref 0.0–0.7)
Eosinophils Relative: 0 %
HCT: 36.6 % (ref 36.0–46.0)
Hemoglobin: 12.6 g/dL (ref 12.0–15.0)
Lymphocytes Relative: 16 %
Lymphs Abs: 3.2 10*3/uL (ref 0.7–4.0)
MCH: 28.1 pg (ref 26.0–34.0)
MCHC: 34.4 g/dL (ref 30.0–36.0)
MCV: 81.5 fL (ref 78.0–100.0)
Monocytes Absolute: 1.4 10*3/uL — ABNORMAL HIGH (ref 0.1–1.0)
Monocytes Relative: 7 %
Neutro Abs: 16.2 10*3/uL — ABNORMAL HIGH (ref 1.7–7.7)
Neutrophils Relative %: 77 %
Platelets: 322 10*3/uL (ref 150–400)
RBC: 4.49 MIL/uL (ref 3.87–5.11)
RDW: 17.7 % — ABNORMAL HIGH (ref 11.5–15.5)
WBC: 20.8 10*3/uL — ABNORMAL HIGH (ref 4.0–10.5)

## 2016-01-17 LAB — I-STAT BETA HCG BLOOD, ED (MC, WL, AP ONLY): I-stat hCG, quantitative: <5 m[IU]/mL (ref <5)

## 2016-01-17 MED ORDER — POTASSIUM CHLORIDE CRYS ER 20 MEQ PO TBCR: 20.0000 meq | EXTENDED_RELEASE_TABLET | Freq: Every day | ORAL | Status: DC

## 2016-01-17 MED ORDER — HYDROMORPHONE HCL 1 MG/ML IJ SOLN
1.0000 mg | Freq: Once | INTRAMUSCULAR | Status: AC
Start: 2016-01-17 — End: 2016-01-17
  Administered 2016-01-17: 1 mg via INTRAMUSCULAR
  Filled 2016-01-17: qty 1

## 2016-01-17 MED ORDER — DIPHENHYDRAMINE HCL 50 MG/ML IJ SOLN
25.0000 mg | Freq: Once | INTRAMUSCULAR | Status: AC
  Administered 2016-01-17: 25 mg via INTRAVENOUS
  Filled 2016-01-17: qty 1

## 2016-01-17 MED ORDER — ONDANSETRON HCL 4 MG/2ML IJ SOLN
4.0000 mg | Freq: Once | INTRAMUSCULAR | Status: AC
  Administered 2016-01-17: 4 mg via INTRAVENOUS
  Filled 2016-01-17: qty 2

## 2016-01-17 MED ORDER — DEXAMETHASONE SODIUM PHOSPHATE 10 MG/ML IJ SOLN
10.0000 mg | Freq: Once | INTRAMUSCULAR | Status: AC
  Administered 2016-01-17: 10 mg via INTRAVENOUS
  Filled 2016-01-17: qty 1

## 2016-01-17 MED ORDER — OXYCODONE-ACETAMINOPHEN 5-325 MG PO TABS: 2.0000 | ORAL_TABLET | ORAL | Status: DC | PRN

## 2016-01-17 MED ORDER — HYDROMORPHONE HCL 1 MG/ML IJ SOLN
1.0000 mg | Freq: Once | INTRAMUSCULAR | Status: AC
  Administered 2016-01-17: 1 mg via INTRAVENOUS
  Filled 2016-01-17: qty 1

## 2016-01-17 MED ORDER — SODIUM CHLORIDE 0.9 % IV BOLUS (SEPSIS)
1000.0000 mL | Freq: Once | INTRAVENOUS | Status: AC
  Administered 2016-01-17: 1000 mL via INTRAVENOUS

## 2016-01-17 NOTE — ED Provider Notes (Signed)
CSN: [id number]     Arrival date & time 01/17/16  1303 History   First MD Initiated Contact with Patient 01/17/16 1354     Chief Complaint  Patient presents with  . Headache  . Blurred Vision  . Neck Pain   PT SEEN IN THE ED ON 4/27 FOR A SEVERE H/A.  SHE HAD A CT HEAD WHICH WAS NL.  SHE WENT TO IR FOR A LP ON 4/28 AND WAS SUPPOSED TO F/U WITH HER NEUROLOGIST.  SHE CAME BACK TO THE ED ON 4/28.  PT WENT TO [place] ED LAST NIGHT FOR THE SAME.   PT CALLED HER NEUROLOGIST'S OFFICE AND WAS TOLD THAT HER NEUROLOGIST WAS ON MATERNITY LEAVE AND HAD NOT BEEN SEEN AT THAT OFFICE IN OVER A YEAR AND THAT SHE HAD TO GO TO THE ED.  THE NOTES ON THE COMPUTER BY THE NEURO OFFICE SHOW THAT PT HAS MISSED MANY APPOINTMENTS.  (Consider location/radiation/quality/duration/timing/severity/associated sxs/prior Treatment) Patient is a 35 y.o. female presenting with headaches and neck pain. The history is provided by the patient.  Headache Pain location:  Generalized Radiates to:  Does not radiate Severity at highest:  10/10 Onset quality:  Gradual Timing:  Constant Progression:  Unchanged Chronicity:  Chronic Relieved by:  Nothing Associated symptoms: neck pain   Neck Pain Associated symptoms: headaches     Past Medical History  Diagnosis Date  . Chronic back pain   . Leukocytosis 02/25/2015  . Headache   . Clinical depression 12/05/2013   History reviewed. No pertinent past surgical history. No family history on file. Social History  Substance Use Topics  . Smoking status: Never Smoker   . Smokeless tobacco: None  . Alcohol Use: Yes     Comment: socially   OB History    No data available     Review of Systems  Musculoskeletal: Positive for neck pain.  Neurological: Positive for headaches.  All other systems reviewed and are negative.     Allergies  Trazodone and nefazodone; Diamox; Maxalt; Caine-1; Hydrocodone; and Latex  Home Medications   Prior to Admission medications    Medication Sig Start Date End Date Taking? Authorizing Provider  acetaminophen (TYLENOL) 500 MG tablet Take 1,000 mg by mouth every 4 (four) hours as needed for mild pain, moderate pain or headache.    Yes Historical Provider, MD  BIOTIN PO Take 1 tablet by mouth daily.   Yes Historical Provider, MD  esomeprazole (NEXIUM) 20 MG capsule Take 20 mg by mouth daily at 12 noon.   Yes Historical Provider, MD  furosemide (LASIX) 40 MG tablet TAKE ONE TABLET BY MOUTH EVERY DAY Patient taking differently: TAKE 40 MG BY MOUTH EVERY DAY 07/13/14  Yes Cameron Sprang, MD  ibuprofen (ADVIL,MOTRIN) 200 MG tablet Take 800 mg by mouth every 6 (six) hours as needed. For inflammation   Yes Historical Provider, MD  Multiple Vitamins-Minerals (MULTIVITAMIN & MINERAL PO) Take 1 tablet by mouth daily.   Yes Historical Provider, MD  oxyCODONE (OXY IR/ROXICODONE) 5 MG immediate release tablet Take 1-2 tablets by mouth every 6 (six) hours as needed. 01/16/16  Yes Historical Provider, MD  HYDROcodone-acetaminophen (NORCO) 7.5-325 MG tablet Take 1 tablet by mouth every 6 (six) hours as needed for moderate pain. Patient not taking: Reported on 01/17/2016 01/14/16   Nat Christen, MD  ondansetron (ZOFRAN) 4 MG tablet Take 1 tablet (4 mg total) by mouth every 6 (six) hours. Patient not taking: Reported on 01/17/2016 01/14/16   Aaron Edelman  Lacinda Axon, MD  oxyCODONE-acetaminophen (PERCOCET/ROXICET) 5-325 MG tablet Take 2 tablets by mouth every 4 (four) hours as needed for severe pain. 01/17/16   Isla Pence, MD  potassium chloride SA (K-DUR,KLOR-CON) 20 MEQ tablet Take 1 tablet (20 mEq total) by mouth daily. 01/17/16   Isla Pence, MD  traMADol (ULTRAM) 50 MG tablet Take 1 tablet (50 mg total) by mouth every 6 (six) hours as needed. Patient not taking: Reported on 01/17/2016 01/14/16   Nat Christen, MD   BP 144/83 mmHg  Pulse 74  Temp(Src) 98.9 F (37.2 C) (Oral)  Resp 17  Ht 5\' 1"  (1.549 m)  Wt 200 lb (90.719 kg)  BMI 37.81 kg/m2  SpO2 100%   LMP 12/21/2015 Physical Exam  Constitutional: She is oriented to person, place, and time. She appears well-developed and well-nourished.  HENT:  Head: Normocephalic and atraumatic.  Right Ear: External ear normal.  Left Ear: External ear normal.  Mouth/Throat: Oropharynx is clear and moist.  Eyes: Conjunctivae and EOM are normal. Pupils are equal, round, and reactive to light.  Neck: Normal range of motion. Neck supple.  Cardiovascular: Normal rate, regular rhythm, normal heart sounds and intact distal pulses.   Pulmonary/Chest: Effort normal and breath sounds normal.  Abdominal: Soft. Bowel sounds are normal.  Musculoskeletal: Normal range of motion.  Neurological: She is alert and oriented to person, place, and time.  Skin: Skin is warm and dry.  Psychiatric: She has a normal mood and affect. Her behavior is normal.  Nursing note and vitals reviewed.   ED Course  Procedures (including critical care time) Labs Review Labs Reviewed  COMPREHENSIVE METABOLIC PANEL - Abnormal; Notable for the following:    Glucose, Bld 116 (*)    All other components within normal limits  CBC WITH DIFFERENTIAL/PLATELET - Abnormal; Notable for the following:    WBC 20.8 (*)    RDW 17.7 (*)    Neutro Abs 16.2 (*)    Monocytes Absolute 1.4 (*)    All other components within normal limits  I-STAT BETA HCG BLOOD, ED ([facility], [facility], [facility] ONLY)    Imaging Review Dg Epidurography  01/18/2016  CLINICAL DATA:  Post lumbar puncture headache. FLUOROSCOPY TIME:  13 seconds corresponding to a Dose Area Product of 36.96 Gy*m2 PROCEDURE: LUMBAR EPIDURAL BLOOD PATCH INJECTION Informed written consent was obtained.  Time-out was performed. Prior to the procedure, 20 ml of the patient's blood was harvested using stringent sterile technique. An interlaminar approach was performed at L2-3 on the LEFT, the level of the lumbar puncture. Under stringent sterile technique, overlying skin was cleansed with betadine soap and  anesthetized with 1% lidocaine without epinephrine. A 3.5 inch 20 gauge needle was advanced using loss-of-resistance technique. DIAGNOSTIC EPIDURAL INJECTION Injection of Isovue-M 200 shows a good epidural pattern with spread above and below the level of needle placement, primarily on the side of needle placement. No vascular or subarachnoid opacification is seen. THERAPEUTIC EPIDURAL INJECTION 15 ml of the patient's blood was injected into the epidural space at the site of prior lumbar puncture. Postprocedure, the patient was improved. She was discharged with instructions to remain flat for the next 24 hours. IMPRESSION: Technically successful lumbar blood patch at L2-3, LEFT. Electronically Signed   By: Staci Righter M.D.   On: 01/18/2016 12:23   I have personally reviewed and evaluated these images and lab results as part of my medical decision-making.   EKG Interpretation None      MDM  DUE TO PT'S PERSISTENT  PAIN S/P LP, I SPOKE WITH DR. Kathlene Cote (IR) ABOUT GETTING A BLOOD PATCH.  HE ARRANGED FOR THE [facility] TO SEE PT TOMORROW.  PT WILL BE GIVEN INSTR AS NEEDED FOR TOMORROW'S APPT AT 11:30.  PT C/O RIGHT HAND IV HURTING, SO SHE DID NOT WANT ANY MORE FLUID THROUGH IT.  SHE WILL BE D/C'D HOME AND KNOWS SHE NEEDS TO SHOW UP FOR THE BLOOD PATCH. Final diagnoses:  Spinal headache      Isla Pence, MD 01/18/16 1358

## 2016-01-17 NOTE — ED Notes (Signed)
Patient is aware we need urine, unable to urinate at this time.

## 2016-01-17 NOTE — Telephone Encounter (Signed)
Spoke with patient. States that she is having bad head pain after LP in the ED, records are in [place] that when she has had LP in the past it relieves her headaches but not this time. States she was told she needs a blood patch. Patient hasn't been seen since 06/2014 with several no shows. Please advise.

## 2016-01-17 NOTE — ED Notes (Signed)
Patient was seen in the ED Thursday and Friday of last week and a lumbar puncture and a CT scan was done. Patient states she also went to [facility] ED yesterday. Patient states she called the neurologist Friday and today and was told her neurologist was on maternity leave and could not be seen because she had not been seen for a year and was told to come back to the ED today for pain control. Patient also c/o blurred vision and neck pain.

## 2016-01-17 NOTE — Telephone Encounter (Signed)
Called patient back. Notified her of Dr. Georgie Chard advisement of going to the ER. She asked if she needed to find another neurologist. I did tell her that was something that would have to be approved if we scheduled her again due to the no show policy that our office has. Will send a msg to Teddi for approval or d/c status.

## 2016-01-17 NOTE — Telephone Encounter (Signed)
She hasn't been seen in over a year, so if she needs acute treatment, she needs to go to the ED.  However, It is our office policy that if a patient no-shows at least 3 visits that they are discharged from the practice.

## 2016-01-17 NOTE — ED Notes (Signed)
RN applied heat packs to IV infiltration site.  No evidence of infiltration until IV fluids began.

## 2016-01-17 NOTE — ED Notes (Signed)
2 IV start attempts 

## 2016-01-17 NOTE — Telephone Encounter (Signed)
PT called and said she had to go to the ER three times over the weekend and needs a call back/Dawn  CB# [phone]

## 2016-01-18 ENCOUNTER — Other Ambulatory Visit: Payer: Self-pay

## 2016-01-18 ENCOUNTER — Ambulatory Visit
Admit: 2016-01-18 | Discharge: 2016-01-18 | Disposition: A | Discharge status: Home / Self Care | Payer: Managed Care, Other (non HMO) | Attending: Emergency Medicine | Admitting: Emergency Medicine

## 2016-01-18 MED ORDER — IOPAMIDOL (ISOVUE-M 200) INJECTION 41%
1.0000 mL | Freq: Once | INTRAMUSCULAR | Status: AC
  Administered 2016-01-18: 1 mL via EPIDURAL

## 2016-01-18 NOTE — Discharge Instructions (Signed)

## 2016-01-18 NOTE — Progress Notes (Signed)
Blood drawn for blood patch, 20 cc's obtained from right forearm. Site is unremarkable and pt tolerated procedure well.

## 2016-02-03 ENCOUNTER — Encounter (HOSPITAL_COMMUNITY): Payer: Self-pay | Admitting: Emergency Medicine

## 2016-02-03 ENCOUNTER — Telehealth: Payer: Self-pay | Admitting: Neurology

## 2016-02-03 ENCOUNTER — Telehealth: Payer: Self-pay | Admitting: *Deleted

## 2016-02-03 ENCOUNTER — Emergency Department (HOSPITAL_COMMUNITY)
Admission: EM | Admit: 2016-02-03 | Discharge: 2016-02-03 | Disposition: A | Discharge status: Home / Self Care | Payer: Managed Care, Other (non HMO) | Attending: Emergency Medicine | Admitting: Emergency Medicine

## 2016-02-03 DIAGNOSIS — Z79899 Other long term (current) drug therapy: Secondary | ICD-10-CM | POA: Diagnosis not present

## 2016-02-03 DIAGNOSIS — R112 Nausea with vomiting, unspecified: Secondary | ICD-10-CM

## 2016-02-03 DIAGNOSIS — Z91040 Latex allergy status: Secondary | ICD-10-CM | POA: Insufficient documentation

## 2016-02-03 DIAGNOSIS — R51 Headache: Secondary | ICD-10-CM | POA: Insufficient documentation

## 2016-02-03 DIAGNOSIS — R519 Headache, unspecified: Secondary | ICD-10-CM

## 2016-02-03 HISTORY — DX: Benign intracranial hypertension: G93.2

## 2016-02-03 LAB — COMPREHENSIVE METABOLIC PANEL
ALT: 28 U/L (ref 14–54)
AST: 26 U/L (ref 15–41)
Albumin: 4.4 g/dL (ref 3.5–5.0)
Alkaline Phosphatase: 68 U/L (ref 38–126)
Anion gap: 11 (ref 5–15)
BUN: 11 mg/dL (ref 6–20)
CO2: 22 mmol/L (ref 22–32)
Calcium: 9.6 mg/dL (ref 8.9–10.3)
Chloride: 104 mmol/L (ref 101–111)
Creatinine, Ser: 0.65 mg/dL (ref 0.44–1.00)
GFR calc Af Amer: >60 mL/min (ref >60)
GFR calc non Af Amer: >60 mL/min (ref >60)
Glucose, Bld: 105 mg/dL — ABNORMAL HIGH (ref 65–99)
Potassium: 3.3 mmol/L — ABNORMAL LOW (ref 3.5–5.1)
Sodium: 137 mmol/L (ref 135–145)
Total Bilirubin: 0.8 mg/dL (ref 0.3–1.2)
Total Protein: 7.5 g/dL (ref 6.5–8.1)

## 2016-02-03 LAB — CBC WITH DIFFERENTIAL/PLATELET
Basophils Absolute: 0 10*3/uL (ref 0.0–0.1)
Basophils Relative: 0 %
Eosinophils Absolute: 0 10*3/uL (ref 0.0–0.7)
Eosinophils Relative: 0 %
HCT: 35.3 % — ABNORMAL LOW (ref 36.0–46.0)
Hemoglobin: 11.8 g/dL — ABNORMAL LOW (ref 12.0–15.0)
Lymphocytes Relative: 21 %
Lymphs Abs: 2.6 10*3/uL (ref 0.7–4.0)
MCH: 28.2 pg (ref 26.0–34.0)
MCHC: 33.4 g/dL (ref 30.0–36.0)
MCV: 84.2 fL (ref 78.0–100.0)
Monocytes Absolute: 0.8 10*3/uL (ref 0.1–1.0)
Monocytes Relative: 6 %
Neutro Abs: 9.2 10*3/uL — ABNORMAL HIGH (ref 1.7–7.7)
Neutrophils Relative %: 73 %
Platelets: 293 10*3/uL (ref 150–400)
RBC: 4.19 MIL/uL (ref 3.87–5.11)
RDW: 18.9 % — ABNORMAL HIGH (ref 11.5–15.5)
WBC: 12.5 10*3/uL — ABNORMAL HIGH (ref 4.0–10.5)

## 2016-02-03 LAB — POC URINE PREG, ED: Preg Test, Ur: NEGATIVE

## 2016-02-03 LAB — HCG, QUANTITATIVE, PREGNANCY: hCG, Beta Chain, Quant, S: <1 m[IU]/mL (ref <5)

## 2016-02-03 MED ORDER — DIPHENHYDRAMINE HCL 50 MG/ML IJ SOLN
25.0000 mg | Freq: Once | INTRAMUSCULAR | Status: AC
  Administered 2016-02-03: 25 mg via INTRAVENOUS
  Filled 2016-02-03: qty 1

## 2016-02-03 MED ORDER — ONDANSETRON 4 MG PO TBDP: 4.0000 mg | ORAL_TABLET | Freq: Three times a day (TID) | ORAL | Status: DC | PRN

## 2016-02-03 MED ORDER — SODIUM CHLORIDE 0.9 % IV BOLUS (SEPSIS)
1000.0000 mL | Freq: Once | INTRAVENOUS | Status: AC
  Administered 2016-02-03: 1000 mL via INTRAVENOUS

## 2016-02-03 MED ORDER — HYDROMORPHONE HCL 1 MG/ML IJ SOLN
1.0000 mg | Freq: Once | INTRAMUSCULAR | Status: AC
  Administered 2016-02-03: 1 mg via INTRAVENOUS
  Filled 2016-02-03: qty 1

## 2016-02-03 MED ORDER — METOCLOPRAMIDE HCL 5 MG/ML IJ SOLN
10.0000 mg | Freq: Once | INTRAMUSCULAR | Status: AC
  Administered 2016-02-03: 10 mg via INTRAVENOUS
  Filled 2016-02-03: qty 2

## 2016-02-03 MED ORDER — KETOROLAC TROMETHAMINE 30 MG/ML IJ SOLN
30.0000 mg | Freq: Once | INTRAMUSCULAR | Status: AC
  Administered 2016-02-03: 30 mg via INTRAVENOUS
  Filled 2016-02-03: qty 1

## 2016-02-03 MED ORDER — ONDANSETRON HCL 4 MG/2ML IJ SOLN
4.0000 mg | Freq: Once | INTRAMUSCULAR | Status: AC
  Administered 2016-02-03: 4 mg via INTRAVENOUS
  Filled 2016-02-03: qty 2

## 2016-02-03 NOTE — Telephone Encounter (Signed)
Message For: OFC                  Taken 18-MAY-17 at  8:06AM by [facility] ------------------------------------------------------------ Tracey Beard              CID [id number]  Patient SAME                 Pt's Dr Reola Calkins PT       Area Code 27 Phone# [place] [phone]    [place] ER DR; NEEDS C/B TO SCHEDULE      APPT                                                 Disp:Y/N N If Y = C/B If No Response In 79minutes ============================================================  Called by Diane.

## 2016-02-03 NOTE — Discharge Instructions (Signed)
Medications: Zofran  Treatment: Take Zofran every 8 hours as needed for nausea and vomiting. Stay hydrated as best as possible.  Follow-up: Please follow-up with your neurology appointment on Tuesday. Please return to emergency department if you develop any new or worsening symptoms.

## 2016-02-03 NOTE — Telephone Encounter (Signed)
Called pt back and scheduled appt with Dr Jannifer Franklin for 02/08/16 dg

## 2016-02-03 NOTE — ED Notes (Addendum)
Pt had lumbar puncture on 5/2. Had too much spinal fluid removed at time, so blood patch was used. Pt reports pain returned to how she felt after the lumbar puncture yesterday. Positive for blurred vision, n/v, and light sensitivity. Has appointment with [facility] next Tuesday but could not wait.

## 2016-02-03 NOTE — ED Notes (Signed)
Pt. Is unable to urinate at this time. Is aware we need a sample. 

## 2016-02-03 NOTE — ED Provider Notes (Signed)
CSN: [id number]     Arrival date & time 02/03/16  1326 History   First MD Initiated Contact with Patient 02/03/16 [phone]     Chief Complaint  Patient presents with  . Headache     (Consider location/radiation/quality/duration/timing/severity/associated sxs/prior Treatment) HPI Comments: Patient is a 35 year old female with history of pseudotumor cerebri who presents with headache, blurred vision, nausea, vomiting, diarrhea. Patient describes her headache as generalized, severe, and stabbing. Patient has associated photophobia. Patient states she had a therapeutic LP on May 2 for a headache and blurred vision. Patient's headache worsened and she had a blood patch which resolved her symptoms. However, yesterday, her headache returned and patient has had associated nausea, vomiting, diarrhea for the past 2-1/2 days. Patient states she cannot keep anything down. No hematemesis or bloody stools. Patient has been taking her home meds as prescribed. Patient has not tried anything at home for symptoms. Patient states she has had a significant weight gain over the past year. Patient denies chest pain, shortness of breath, abdominal pain, dysuria. However, patient has had decreased urination over the past 2 days. Patient has appointment with a new neurologist on Tuesday, however she states she could not wait to see them due to her pain and nausea/vomiting.  Patient is a 35 y.o. female presenting with headaches. The history is provided by the patient.  Headache Associated symptoms: diarrhea, nausea and vomiting   Associated symptoms: no abdominal pain, no back pain, no fever and no sore throat     Past Medical History  Diagnosis Date  . Chronic back pain   . Leukocytosis 02/25/2015  . Headache   . Clinical depression 12/05/2013  . Idiopathic intracranial hypertension    History reviewed. No pertinent past surgical history. History reviewed. No pertinent family history. Social History  Substance Use  Topics  . Smoking status: Never Smoker   . Smokeless tobacco: None  . Alcohol Use: Yes     Comment: socially   OB History    No data available     Review of Systems  Constitutional: Negative for fever and chills.  HENT: Negative for facial swelling and sore throat.   Respiratory: Negative for shortness of breath.   Cardiovascular: Negative for chest pain.  Gastrointestinal: Positive for nausea, vomiting and diarrhea. Negative for abdominal pain.  Genitourinary: Negative for dysuria.  Musculoskeletal: Negative for back pain.  Skin: Negative for rash and wound.  Neurological: Positive for headaches.  Psychiatric/Behavioral: The patient is not nervous/anxious.       Allergies  Elavil; Trazodone and nefazodone; Diamox; Maxalt; Benzocaine; Caine-1; Latex; and Novocain  Home Medications   Prior to Admission medications   Medication Sig Start Date End Date Taking? Authorizing Provider  acetaminophen (TYLENOL) 500 MG tablet Take 1,000 mg by mouth every 4 (four) hours as needed for mild pain, moderate pain or headache.    Yes Historical Provider, MD  BIOTIN PO Take 1 tablet by mouth daily.   Yes Historical Provider, MD  esomeprazole (NEXIUM) 20 MG capsule Take 20 mg by mouth daily at 12 noon.   Yes Historical Provider, MD  furosemide (LASIX) 40 MG tablet TAKE ONE TABLET BY MOUTH EVERY DAY Patient taking differently: TAKE 40 MG BY MOUTH EVERY DAY 07/13/14  Yes Cameron Sprang, MD  ibuprofen (ADVIL,MOTRIN) 200 MG tablet Take 800 mg by mouth every 6 (six) hours as needed for headache or moderate pain. For inflammation   Yes Historical Provider, MD  Multiple Vitamins-Minerals (MULTIVITAMIN & MINERAL PO) Take  1 tablet by mouth daily.   Yes Historical Provider, MD  ondansetron (ZOFRAN) 4 MG tablet Take 1 tablet (4 mg total) by mouth every 6 (six) hours. Patient taking differently: Take 4 mg by mouth every 8 (eight) hours as needed for nausea or vomiting.  01/14/16  Yes Nat Christen, MD    potassium chloride SA (K-DUR,KLOR-CON) 20 MEQ tablet Take 1 tablet (20 mEq total) by mouth daily. 01/17/16  Yes Isla Pence, MD  promethazine (PHENERGAN) 25 MG suppository Place 25 mg rectally every 6 (six) hours as needed. Nausea/vomitting 01/18/16  Yes Historical Provider, MD  HYDROcodone-acetaminophen (NORCO) 7.5-325 MG tablet Take 1 tablet by mouth every 6 (six) hours as needed for moderate pain. Patient not taking: Reported on 01/17/2016 01/14/16   Nat Christen, MD  ondansetron (ZOFRAN ODT) 4 MG disintegrating tablet Take 1 tablet (4 mg total) by mouth every 8 (eight) hours as needed for nausea or vomiting. 02/03/16   Frederica Kuster, PA-C  oxyCODONE-acetaminophen (PERCOCET/ROXICET) 5-325 MG tablet Take 2 tablets by mouth every 4 (four) hours as needed for severe pain. Patient not taking: Reported on 02/03/2016 01/17/16   Isla Pence, MD  traMADol (ULTRAM) 50 MG tablet Take 1 tablet (50 mg total) by mouth every 6 (six) hours as needed. Patient not taking: Reported on 01/17/2016 01/14/16   Nat Christen, MD   BP 151/84 mmHg  Pulse 83  Temp(Src) 99.2 F (37.3 C) (Oral)  Resp 18  SpO2 100%  LMP 12/21/2015 Physical Exam  Constitutional: She appears well-developed and well-nourished. No distress.  Patient wearing sunglasses in a dark room  HENT:  Head: Normocephalic and atraumatic.  Mouth/Throat: Oropharynx is clear and moist. No oropharyngeal exudate.  Eyes: Conjunctivae and EOM are normal. Pupils are equal, round, and reactive to light. Right eye exhibits no discharge. Left eye exhibits no discharge. No scleral icterus.  Neck: Normal range of motion. Neck supple. No thyromegaly present.    Patient able to bring chin to chest without pain; tenderness noted bilaterally on this skull  Cardiovascular: Normal rate, regular rhythm, normal heart sounds and intact distal pulses.  Exam reveals no gallop and no friction rub.   No murmur heard. Pulmonary/Chest: Effort normal and breath sounds normal. No  stridor. No respiratory distress. She has no wheezes. She has no rales.  Abdominal: Soft. Bowel sounds are normal. She exhibits no distension. There is no tenderness. There is no rebound and no guarding.  Musculoskeletal: She exhibits no edema.  Lymphadenopathy:    She has no cervical adenopathy.  Neurological: She is alert. Coordination normal.  Cn 3-12 intact; normal sensation throughout; 5/5 strength in all 4 extremities; equal bilateral grip strength; no ataxia on finger to nose; no meningismus  Skin: Skin is warm and dry. No rash noted. She is not diaphoretic. No pallor.  Psychiatric: She has a normal mood and affect.  Nursing note and vitals reviewed.   ED Course  Procedures (including critical care time) Labs Review Labs Reviewed  CBC WITH DIFFERENTIAL/PLATELET - Abnormal; Notable for the following:    WBC 12.5 (*)    Hemoglobin 11.8 (*)    HCT 35.3 (*)    RDW 18.9 (*)    Neutro Abs 9.2 (*)    All other components within normal limits  COMPREHENSIVE METABOLIC PANEL - Abnormal; Notable for the following:    Potassium 3.3 (*)    Glucose, Bld 105 (*)    All other components within normal limits  HCG, QUANTITATIVE, PREGNANCY  POC URINE  PREG, ED    Imaging Review No results found. I have personally reviewed and evaluated these images and lab results as part of my medical decision-making.   EKG Interpretation None      MDM   Suspect this is related to patient's pseudotumor cerebri. No fever, meningismus. CBC, CMP unchanged from past results. Urine pregnancy negative. Patient's pain and nausea controlled in ED with Dilaudid and Zofran respectively. Patient did not find relief with Toradol and Reglan. Patient states her pain is bearable to follow-up with her neurologist on Tuesday. Discharged with Zofran. Return precautions discussed. Patient understands and is in agreement with plan. Patient vitals stable throughout ED course and discharged in satisfactory condition. Patient  also evaluated by Dr. Regenia Skeeter who is in agreement with plan.  Final diagnoses:  Non-intractable vomiting with nausea, vomiting of unspecified type  Nonintractable headache, unspecified chronicity pattern, unspecified headache type        Frederica Kuster, PA-C 02/03/16 [place], MD 02/04/16 1711

## 2016-02-08 ENCOUNTER — Telehealth: Payer: Self-pay

## 2016-02-08 ENCOUNTER — Ambulatory Visit: Payer: Managed Care, Other (non HMO) | Admitting: Neurology

## 2016-02-08 ENCOUNTER — Telehealth: Payer: Self-pay | Admitting: Neurology

## 2016-02-08 NOTE — Telephone Encounter (Signed)
Patient did not show to appt today  

## 2016-02-08 NOTE — Telephone Encounter (Signed)
This patient has no showed for a new patient appointment today. She has been discharged from [facility] neurology for multiple no shows. She was seen previously through this office, she did not show for her last revisit appointment.

## 2016-02-09 ENCOUNTER — Encounter: Payer: Self-pay | Admitting: Neurology

## 2016-02-17 ENCOUNTER — Other Ambulatory Visit: Payer: Self-pay | Admitting: Neurology

## 2016-02-18 ENCOUNTER — Other Ambulatory Visit: Payer: Self-pay | Admitting: Neurology

## 2016-04-30 ENCOUNTER — Encounter (HOSPITAL_COMMUNITY): Payer: Self-pay

## 2016-04-30 ENCOUNTER — Emergency Department (HOSPITAL_COMMUNITY): Payer: Managed Care, Other (non HMO)

## 2016-04-30 ENCOUNTER — Emergency Department (HOSPITAL_COMMUNITY)
Admission: EM | Admit: 2016-04-30 | Discharge: 2016-04-30 | Disposition: A | Discharge status: Home / Self Care | Payer: Managed Care, Other (non HMO) | Attending: Emergency Medicine | Admitting: Emergency Medicine

## 2016-04-30 DIAGNOSIS — R079 Chest pain, unspecified: Secondary | ICD-10-CM | POA: Insufficient documentation

## 2016-04-30 DIAGNOSIS — I10 Essential (primary) hypertension: Secondary | ICD-10-CM | POA: Insufficient documentation

## 2016-04-30 DIAGNOSIS — Z79899 Other long term (current) drug therapy: Secondary | ICD-10-CM | POA: Diagnosis not present

## 2016-04-30 DIAGNOSIS — F419 Anxiety disorder, unspecified: Secondary | ICD-10-CM | POA: Insufficient documentation

## 2016-04-30 DIAGNOSIS — Z91040 Latex allergy status: Secondary | ICD-10-CM | POA: Diagnosis not present

## 2016-04-30 LAB — CBC
HCT: 34.1 % — ABNORMAL LOW (ref 36.0–46.0)
Hemoglobin: 11.3 g/dL — ABNORMAL LOW (ref 12.0–15.0)
MCH: 29.7 pg (ref 26.0–34.0)
MCHC: 33.1 g/dL (ref 30.0–36.0)
MCV: 89.5 fL (ref 78.0–100.0)
Platelets: 311 10*3/uL (ref 150–400)
RBC: 3.81 MIL/uL — ABNORMAL LOW (ref 3.87–5.11)
RDW: 14.8 % (ref 11.5–15.5)
WBC: 7.7 10*3/uL (ref 4.0–10.5)

## 2016-04-30 LAB — BASIC METABOLIC PANEL
Anion gap: 8 (ref 5–15)
BUN: 10 mg/dL (ref 6–20)
CO2: 26 mmol/L (ref 22–32)
Calcium: 9.5 mg/dL (ref 8.9–10.3)
Chloride: 103 mmol/L (ref 101–111)
Creatinine, Ser: 0.64 mg/dL (ref 0.44–1.00)
GFR calc Af Amer: >60 mL/min (ref >60)
GFR calc non Af Amer: >60 mL/min (ref >60)
Glucose, Bld: 107 mg/dL — ABNORMAL HIGH (ref 65–99)
Potassium: 3.6 mmol/L (ref 3.5–5.1)
Sodium: 137 mmol/L (ref 135–145)

## 2016-04-30 LAB — D-DIMER, QUANTITATIVE: D-Dimer, Quant: <0.27 ug/mL-FEU (ref 0.00–0.50)

## 2016-04-30 LAB — I-STAT TROPONIN, ED: Troponin i, poc: 0.01 ng/mL (ref 0.00–0.08)

## 2016-04-30 MED ORDER — IBUPROFEN 200 MG PO TABS
600.0000 mg | ORAL_TABLET | Freq: Once | ORAL | Status: AC
  Administered 2016-04-30: 600 mg via ORAL
  Filled 2016-04-30: qty 3

## 2016-04-30 MED ORDER — LORAZEPAM 0.5 MG PO TABS: 0.5000 mg | ORAL_TABLET | Freq: Every evening | ORAL | 0 refills | Status: DC | PRN

## 2016-04-30 MED ORDER — ONDANSETRON 8 MG PO TBDP
8.0000 mg | ORAL_TABLET | Freq: Once | ORAL | Status: AC
  Administered 2016-04-30: 8 mg via ORAL
  Filled 2016-04-30: qty 1

## 2016-04-30 NOTE — ED Triage Notes (Signed)
Patient c/o chest pain that began x3 days ago.  Patient states that while at work yesterday she began to have pain again and became diaphoretic and stated she "blacked out" .  Patient states that attempted to go to work this morning and the chest pain returned with SOB and numbness and tingling in the right hand.  Patient denies vomiting, denies back pain.  Patient states pain 7/10

## 2016-04-30 NOTE — ED Provider Notes (Signed)
[facility] DEPT Provider Note   CSN: [id number] Arrival date & time: 04/30/16  [id number]  First Provider Contact:  First MD Initiated Contact with Patient 04/30/16 [phone]        History   Chief Complaint Chief Complaint  Patient presents with  . Chest Pain    HPI Tracey Beard is a 35 y.o. female.  The history is provided by the patient.  Chest Pain   Associated symptoms include headaches. Pertinent negatives include no abdominal pain, no back pain, no nausea, no numbness, no shortness of breath, no vomiting and no weakness.  Patient presents with 2 days of chest pain. It is a dull pain on the top of her mid chest. She's had it for the last 2 days. She states that yesterday while at work she had a syncopal episode. States she was just at her desk and then woke up. No difficulty breathing. No trauma. No fevers or chills. States at that time she had some tingling in her right hand but that has resolved. No abdominal pain. No new swelling in her legs. She does have a history of pseudotumor cerebri and is on Lasix for it. Slight headache but no vision changes. No difficulty walking. States she does not feel as if her pseudotumor is flaring up. No recent travel. She does not smoke. No family history of early cardiac disease. Pain is not worse with exertion. No change in her activity level.  Past Medical History:  Diagnosis Date  . Chronic back pain   . Clinical depression 12/05/2013  . Headache   . Idiopathic intracranial hypertension   . Leukocytosis 02/25/2015    Patient Active Problem List   Diagnosis Date Noted  . Snores 09/21/2015  . Periodic limb movement 09/21/2015  . Difficulty staying awake 09/21/2015  . 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 Leukocytosis 02/25/2015  . 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/2015  . Nonspecific elevation of levels of transaminase and lactic acid dehydrogenase (LDH) 01/13/2015  . Morbid obesity ([place]) 12/17/2014  . 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  Pseudotumor cerebri 07/11/2014  . Obesity 07/11/2014  . Chronic back pain 07/11/2014  . Anxiety state 12/05/2013  . Back ache 12/05/2013  . Clinical depression 12/05/2013  . 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 Awareness of heartbeats 12/05/2013  . Cannot sleep 12/05/2013  . Family history of breast cancer 12/05/2013    History reviewed. No pertinent surgical history.  OB History    No data available       Home Medications    Prior to Admission medications   Medication Sig Start Date End Date Taking? Authorizing Provider  acetaminophen (TYLENOL) 500 MG tablet Take 1,000 mg by mouth every 4 (four) hours as needed for mild pain, moderate pain or headache.    Yes Historical Provider, MD  BIOTIN PO Take 1 tablet by mouth daily.   Yes Historical Provider, MD  clomiPHENE (CLOMID) 50 MG tablet Take 100 mg by mouth daily. Take on the 5th-9th day of menstrual cycle 04/04/16  Yes Historical Provider, MD  esomeprazole (NEXIUM) 20 MG capsule Take 20 mg by mouth daily at 12 noon.   Yes Historical Provider, MD  furosemide (LASIX) 40 MG tablet TAKE ONE TABLET BY MOUTH EVERY DAY Patient taking differently: TAKE 40 MG BY MOUTH EVERY DAY 07/13/14  Yes Cameron Sprang, MD  Multiple Vitamins-Minerals (MULTIVITAMIN & MINERAL PO) Take 1 tablet by mouth daily.   Yes Historical Provider, MD  ondansetron (ZOFRAN) 4 MG tablet Take 1 tablet (4  mg total) by mouth every 6 (six) hours. Patient taking differently: Take 4 mg by mouth every 8 (eight) hours as needed for nausea or vomiting.  01/14/16  Yes Nat Christen, MD  potassium chloride SA (K-DUR,KLOR-CON) 20 MEQ tablet Take 1 tablet (20 mEq total) by mouth daily. 01/17/16  Yes Isla Pence, MD  HYDROcodone-acetaminophen (NORCO) 7.5-325 MG tablet Take 1 tablet by mouth every 6 (six) hours as needed for moderate pain. Patient not taking: Reported on 01/17/2016 01/14/16   Nat Christen, MD  LORazepam (ATIVAN) 0.5 MG tablet Take 1 tablet (0.5 mg total) by mouth at  bedtime as needed for anxiety. 04/30/16   Davonna Belling, MD  ondansetron (ZOFRAN ODT) 4 MG disintegrating tablet Take 1 tablet (4 mg total) by mouth every 8 (eight) hours as needed for nausea or vomiting. Patient not taking: Reported on 04/30/2016 02/03/16   Frederica Kuster, PA-C  oxyCODONE-acetaminophen (PERCOCET/ROXICET) 5-325 MG tablet Take 2 tablets by mouth every 4 (four) hours as needed for severe pain. Patient not taking: Reported on 02/03/2016 01/17/16   Isla Pence, MD  traMADol (ULTRAM) 50 MG tablet Take 1 tablet (50 mg total) by mouth every 6 (six) hours as needed. Patient not taking: Reported on 01/17/2016 01/14/16   Nat Christen, MD    Family History No family history on file.  Social History Social History  Substance Use Topics  . Smoking status: Never Smoker  . Smokeless tobacco: Never Used  . Alcohol use Yes     Comment: socially     Allergies   Elavil [amitriptyline hcl]; Trazodone and nefazodone; Diamox [acetazolamide]; Maxalt [rizatriptan benzoate]; Benzocaine; Caine-1 [lidocaine hcl]; Latex; and Novocain [procaine]   Review of Systems Review of Systems  Constitutional: Negative for activity change and appetite change.  Eyes: Negative for pain.  Respiratory: Negative for chest tightness and shortness of breath.   Cardiovascular: Positive for chest pain. Negative for leg swelling.  Gastrointestinal: Negative for abdominal pain, diarrhea, nausea and vomiting.  Genitourinary: Negative for flank pain.  Musculoskeletal: Negative for back pain and neck stiffness.  Skin: Negative for rash.  Neurological: Positive for syncope and headaches. Negative for weakness and numbness.  Psychiatric/Behavioral: Negative for behavioral problems. The patient is nervous/anxious.      Physical Exam Updated Vital Signs BP 131/96 (BP Location: Left Arm)   Pulse 104   Temp 97.8 F (36.6 C) (Oral)   Resp 17   Ht 5\' 1"  (1.549 m)   Wt 200 lb (90.7 kg)   LMP 03/30/2016 (Approximate)    SpO2 100%   BMI 37.79 kg/m   Physical Exam  Constitutional: She is oriented to person, place, and time. She appears well-developed and well-nourished.  HENT:  Head: Normocephalic and atraumatic.  Eyes: Pupils are equal, round, and reactive to light.  Neck: Normal range of motion. Neck supple.  Cardiovascular: Regular rhythm and normal heart sounds.   No murmur heard. Mild tachycardia.  Pulmonary/Chest: Effort normal and breath sounds normal. No respiratory distress. She has no wheezes. She has no rales.  Abdominal: Soft. Bowel sounds are normal. She exhibits no distension. There is no tenderness. There is no rebound and no guarding.  Musculoskeletal: Normal range of motion.  Neurological: She is alert and oriented to person, place, and time. No cranial nerve deficit.  Skin: Skin is warm and dry.  Psychiatric: She has a normal mood and affect. Her speech is normal.  Nursing note and vitals reviewed.    ED Treatments / Results  Labs (all  labs ordered are listed, but only abnormal results are displayed) Labs Reviewed  BASIC METABOLIC PANEL - Abnormal; Notable for the following:       Result Value   Glucose, Bld 107 (*)    All other components within normal limits  CBC - Abnormal; Notable for the following:    RBC 3.81 (*)    Hemoglobin 11.3 (*)    HCT 34.1 (*)    All other components within normal limits  D-DIMER, QUANTITATIVE (NOT AT [facility])  Randolm Idol, ED    EKG  EKG Interpretation  Date/Time:  Sunday April 30 2016 07:06:16 EDT Ventricular Rate:  98 PR Interval:    QRS Duration: 83 QT Interval:  315 QTC Calculation: 403 R Axis:   70 Text Interpretation:  Sinus rhythm Nonspecific T abnormalities, diffuse leads Baseline wander in lead(s) I since last tracing no significant change Confirmed by Eulis Foster  MD, Vira Agar [id number]) on 04/30/2016 7:12:10 AM Also confirmed by Alvino Chapel  MD, Leshon Armistead [phone])  on 04/30/2016 7:34:47 AM       Radiology Dg Chest 2 View  Result  Date: 04/30/2016 CLINICAL DATA:  Chest pain EXAM: CHEST  2 VIEW COMPARISON:  01/16/2014 chest radiograph. FINDINGS: Stable cardiomediastinal silhouette with normal heart size. No pneumothorax. No pleural effusion. Lungs appear clear, with no acute consolidative airspace disease and no pulmonary edema. IMPRESSION: No active cardiopulmonary disease. Electronically Signed   By: Ilona Sorrel M.D.   On: 04/30/2016 07:38    Procedures Procedures (including critical care time)  Medications Ordered in ED Medications  ibuprofen (ADVIL,MOTRIN) tablet 600 mg (600 mg Oral Given 04/30/16 0825)  ondansetron (ZOFRAN-ODT) disintegrating tablet 8 mg (8 mg Oral Given 04/30/16 [id number])     Initial Impression / Assessment and Plan / ED Course  I have reviewed the triage vital signs and the nursing notes.  Pertinent labs & imaging results that were available during my care of the patient were reviewed by me and considered in my medical decision making (see chart for details).  Clinical Course    *Patient presented with episode of chest pain and episode of syncope. In chest. Lab work overall reassuring. Negative d-dimer. Chest x-ray reassuring. After discussion with the patient she states she is having more anxiety episodes. States it comes on at night because her aunt and mother are dealing with stage IV cancer. Also requested neurology follow-up. Given with our GI information. Will discharge home.  Final Clinical Impressions(s) / ED Diagnoses   Final diagnoses:  Chest pain, unspecified chest pain type  Anxiety    New Prescriptions New Prescriptions   LORAZEPAM (ATIVAN) 0.5 MG TABLET    Take 1 tablet (0.5 mg total) by mouth at bedtime as needed for anxiety.     Davonna Belling, MD 04/30/16 [phone]

## 2016-06-03 ENCOUNTER — Encounter (HOSPITAL_COMMUNITY): Payer: Self-pay | Admitting: Emergency Medicine

## 2016-06-03 ENCOUNTER — Emergency Department (HOSPITAL_COMMUNITY)
Admission: EM | Admit: 2016-06-03 | Discharge: 2016-06-03 | Disposition: A | Discharge status: Home / Self Care | Payer: Managed Care, Other (non HMO) | Attending: Emergency Medicine | Admitting: Emergency Medicine

## 2016-06-03 DIAGNOSIS — Z791 Long term (current) use of non-steroidal anti-inflammatories (NSAID): Secondary | ICD-10-CM | POA: Insufficient documentation

## 2016-06-03 DIAGNOSIS — Z3A00 Weeks of gestation of pregnancy not specified: Secondary | ICD-10-CM | POA: Diagnosis not present

## 2016-06-03 DIAGNOSIS — R197 Diarrhea, unspecified: Secondary | ICD-10-CM | POA: Insufficient documentation

## 2016-06-03 DIAGNOSIS — J0100 Acute maxillary sinusitis, unspecified: Secondary | ICD-10-CM | POA: Insufficient documentation

## 2016-06-03 DIAGNOSIS — Z79899 Other long term (current) drug therapy: Secondary | ICD-10-CM | POA: Insufficient documentation

## 2016-06-03 DIAGNOSIS — Z91040 Latex allergy status: Secondary | ICD-10-CM | POA: Insufficient documentation

## 2016-06-03 DIAGNOSIS — O99519 Diseases of the respiratory system complicating pregnancy, unspecified trimester: Secondary | ICD-10-CM | POA: Diagnosis not present

## 2016-06-03 DIAGNOSIS — O219 Vomiting of pregnancy, unspecified: Secondary | ICD-10-CM | POA: Diagnosis present

## 2016-06-03 DIAGNOSIS — N898 Other specified noninflammatory disorders of vagina: Secondary | ICD-10-CM | POA: Diagnosis not present

## 2016-06-03 LAB — I-STAT BETA HCG BLOOD, ED (MC, WL, AP ONLY): I-stat hCG, quantitative: 142.5 m[IU]/mL — ABNORMAL HIGH (ref <5)

## 2016-06-03 LAB — COMPREHENSIVE METABOLIC PANEL
ALT: 33 U/L (ref 14–54)
AST: 27 U/L (ref 15–41)
Albumin: 4.1 g/dL (ref 3.5–5.0)
Alkaline Phosphatase: 61 U/L (ref 38–126)
Anion gap: 8 (ref 5–15)
BUN: 10 mg/dL (ref 6–20)
CO2: 24 mmol/L (ref 22–32)
Calcium: 9.5 mg/dL (ref 8.9–10.3)
Chloride: 106 mmol/L (ref 101–111)
Creatinine, Ser: 0.62 mg/dL (ref 0.44–1.00)
GFR calc Af Amer: >60 mL/min (ref >60)
GFR calc non Af Amer: >60 mL/min (ref >60)
Glucose, Bld: 112 mg/dL — ABNORMAL HIGH (ref 65–99)
Potassium: 3.8 mmol/L (ref 3.5–5.1)
Sodium: 138 mmol/L (ref 135–145)
Total Bilirubin: 0.4 mg/dL (ref 0.3–1.2)
Total Protein: 8.1 g/dL (ref 6.5–8.1)

## 2016-06-03 LAB — CBC WITH DIFFERENTIAL/PLATELET
Basophils Absolute: 0 10*3/uL (ref 0.0–0.1)
Basophils Relative: 0 %
Eosinophils Absolute: 0.2 10*3/uL (ref 0.0–0.7)
Eosinophils Relative: 1 %
HCT: 35 % — ABNORMAL LOW (ref 36.0–46.0)
Hemoglobin: 11.5 g/dL — ABNORMAL LOW (ref 12.0–15.0)
Lymphocytes Relative: 17 %
Lymphs Abs: 2.4 10*3/uL (ref 0.7–4.0)
MCH: 29.6 pg (ref 26.0–34.0)
MCHC: 32.9 g/dL (ref 30.0–36.0)
MCV: 90.2 fL (ref 78.0–100.0)
Monocytes Absolute: 1 10*3/uL (ref 0.1–1.0)
Monocytes Relative: 7 %
Neutro Abs: 10.3 10*3/uL — ABNORMAL HIGH (ref 1.7–7.7)
Neutrophils Relative %: 75 %
Platelets: 315 10*3/uL (ref 150–400)
RBC: 3.88 MIL/uL (ref 3.87–5.11)
RDW: 15.2 % (ref 11.5–15.5)
WBC: 13.9 10*3/uL — ABNORMAL HIGH (ref 4.0–10.5)

## 2016-06-03 LAB — I-STAT CG4 LACTIC ACID, ED: Lactic Acid, Venous: 1.78 mmol/L (ref 0.5–1.9)

## 2016-06-03 LAB — HCG, QUANTITATIVE, PREGNANCY: hCG, Beta Chain, Quant, S: 172 m[IU]/mL — ABNORMAL HIGH (ref <5)

## 2016-06-03 MED ORDER — METOCLOPRAMIDE HCL 5 MG/ML IJ SOLN
10.0000 mg | Freq: Once | INTRAMUSCULAR | Status: AC
  Administered 2016-06-03: 10 mg via INTRAVENOUS
  Filled 2016-06-03: qty 2

## 2016-06-03 MED ORDER — CLOTRIMAZOLE 1 % VA CREA: 1.0000 | TOPICAL_CREAM | Freq: Every day | VAGINAL | 0 refills | Status: AC

## 2016-06-03 MED ORDER — METOCLOPRAMIDE HCL 10 MG PO TABS: 10.0000 mg | ORAL_TABLET | Freq: Four times a day (QID) | ORAL | 0 refills | Status: DC

## 2016-06-03 NOTE — ED Triage Notes (Signed)
Pt presents to the ED with complaints of generalized illness x 1 week.  Pt was seen in her doctor's office and given a prescription for Amoxicillin which she is still taking she states with no improvement. Tylenol prn at home.  States she also believes she has a yeast infection. Nausea, vomiting, body aches, intermittent fevers, chills, and sweating.  Pt appears ill in triage.

## 2016-06-03 NOTE — ED Provider Notes (Signed)
[facility] DEPT Provider Note   CSN: [id number] Arrival date & time: 06/03/16  [id number]     History   Chief Complaint Chief Complaint  Patient presents with  . Illness    HPI Tracey Beard is a 35 y.o. female who presents with ongoing illness. PMH significant for IIH, chronic back pain, depression. She is currently pregnant. She reports that her symptoms started 9 days ago. It started with a sore throat, fever of 102, with generalized body aches, and diaphoresis while at work. She was sent to UC who did a flu and strep test which were both negative. She was prescribed Tamiflu however did not take it. Her symptoms did not improve over the weekend and she went to see her PCP 5 days ago. Some blood work was done and she was prescribed Amoxicillin. WBC count was noted to be 12.1. She reports she has not gotten any better since then although she tried to go back to work. Today she reports fever of 100.4 - took a Tylenol prior to arrival. She also has a headache which she attributes to sinus pressure, nasal congestion, sore throat, post-nasal drip, cough, N/V. She states she also has diarrhea and vaginal itching and soreness which she attributes to the antibiotic. She is also requesting a hcg quant.  HPI  Past Medical History:  Diagnosis Date  . Chronic back pain   . Clinical depression 12/05/2013  . Headache   . Idiopathic intracranial hypertension   . Leukocytosis 02/25/2015    Patient Active Problem List   Diagnosis Date Noted  . Snores 09/21/2015  . Periodic limb movement 09/21/2015  . Difficulty staying awake 09/21/2015  . 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 Leukocytosis 02/25/2015  . 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/2015  . Nonspecific elevation of levels of transaminase and lactic acid dehydrogenase (LDH) 01/13/2015  . Morbid obesity ([place]) 12/17/2014  . 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 Pseudotumor cerebri 07/11/2014  . Obesity 07/11/2014  . Chronic back pain 07/11/2014  .  Anxiety state 12/05/2013  . Back ache 12/05/2013  . Clinical depression 12/05/2013  . 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 Awareness of heartbeats 12/05/2013  . Cannot sleep 12/05/2013  . Family history of breast cancer 12/05/2013    History reviewed. No pertinent surgical history.  OB History    No data available       Home Medications    Prior to Admission medications   Medication Sig Start Date End Date Taking? Authorizing Provider  acetaminophen (TYLENOL) 500 MG tablet Take 1,000 mg by mouth every 4 (four) hours as needed for mild pain, moderate pain or headache.     Historical Provider, MD  BIOTIN PO Take 1 tablet by mouth daily.    Historical Provider, MD  clomiPHENE (CLOMID) 50 MG tablet Take 100 mg by mouth daily. Take on the 5th-9th day of menstrual cycle 04/04/16   Historical Provider, MD  esomeprazole (NEXIUM) 20 MG capsule Take 20 mg by mouth daily at 12 noon.    Historical Provider, MD  furosemide (LASIX) 40 MG tablet TAKE ONE TABLET BY MOUTH EVERY DAY Patient taking differently: TAKE 40 MG BY MOUTH EVERY DAY 07/13/14   Cameron Sprang, MD  HYDROcodone-acetaminophen ([place]) 7.5-325 MG tablet Take 1 tablet by mouth every 6 (six) hours as needed for moderate pain. Patient not taking: Reported on 01/17/2016 01/14/16   Nat Christen, MD  LORazepam (ATIVAN) 0.5 MG tablet Take 1 tablet (0.5 mg total) by mouth at bedtime as needed for anxiety.  04/30/16   Davonna Belling, MD  Multiple Vitamins-Minerals (MULTIVITAMIN & MINERAL PO) Take 1 tablet by mouth daily.    Historical Provider, MD  ondansetron (ZOFRAN ODT) 4 MG disintegrating tablet Take 1 tablet (4 mg total) by mouth every 8 (eight) hours as needed for nausea or vomiting. Patient not taking: Reported on 04/30/2016 02/03/16   Bea Graff Law, PA-C  ondansetron (ZOFRAN) 4 MG tablet Take 1 tablet (4 mg total) by mouth every 6 (six) hours. Patient taking differently: Take 4 mg by mouth every 8 (eight) hours as needed for nausea or  vomiting.  01/14/16   Nat Christen, MD  oxyCODONE-acetaminophen (PERCOCET/ROXICET) 5-325 MG tablet Take 2 tablets by mouth every 4 (four) hours as needed for severe pain. Patient not taking: Reported on 02/03/2016 01/17/16   Isla Pence, MD  potassium chloride SA (K-DUR,KLOR-CON) 20 MEQ tablet Take 1 tablet (20 mEq total) by mouth daily. 01/17/16   Isla Pence, MD  traMADol (ULTRAM) 50 MG tablet Take 1 tablet (50 mg total) by mouth every 6 (six) hours as needed. Patient not taking: Reported on 01/17/2016 01/14/16   Nat Christen, MD    Family History No family history on file.  Social History Social History  Substance Use Topics  . Smoking status: Never Smoker  . Smokeless tobacco: Never Used  . Alcohol use Yes     Comment: socially     Allergies   Elavil [amitriptyline hcl]; Trazodone and nefazodone; Diamox [acetazolamide]; Maxalt [rizatriptan benzoate]; Benzocaine; Caine-1 [lidocaine hcl]; Latex; and Novocain [procaine]   Review of Systems Review of Systems  Constitutional: Positive for diaphoresis and fever. Negative for chills.  Respiratory: Positive for cough. Negative for shortness of breath.   Cardiovascular: Negative for chest pain.  Gastrointestinal: Positive for diarrhea, nausea and vomiting. Negative for abdominal pain and constipation.  Genitourinary: Negative for dysuria, vaginal bleeding, vaginal discharge and vaginal pain.       Vaginal "soreness" and itching  All other systems reviewed and are negative.    Physical Exam Updated Vital Signs BP 150/94 (BP Location: Left Arm)   Pulse 98   Temp 98.5 F (36.9 C) (Oral)   Resp 18   Ht 5\' 1"  (1.549 m)   Wt 93 kg   SpO2 100%   BMI 38.73 kg/m   Physical Exam  Constitutional: She is oriented to person, place, and time. She appears well-developed and well-nourished. No distress.  Obese female in NAD. Wearing mask  HENT:  Head: Normocephalic and atraumatic.  Right Ear: Hearing, tympanic membrane, external ear and  ear canal normal.  Left Ear: Hearing, tympanic membrane, external ear and ear canal normal.  Nose: Mucosal edema present. Right sinus exhibits maxillary sinus tenderness. Right sinus exhibits no frontal sinus tenderness. Left sinus exhibits maxillary sinus tenderness. Left sinus exhibits no frontal sinus tenderness.  Mouth/Throat: Uvula is midline, oropharynx is clear and moist and mucous membranes are normal. No oropharyngeal exudate, posterior oropharyngeal erythema or tonsillar abscesses.  Eyes: Conjunctivae are normal. Pupils are equal, round, and reactive to light. Right eye exhibits no discharge. Left eye exhibits no discharge. No scleral icterus.  Neck: Normal range of motion. Neck supple.  Cardiovascular: Normal rate and regular rhythm.  Exam reveals no gallop and no friction rub.   No murmur heard. Pulmonary/Chest: Effort normal and breath sounds normal. No respiratory distress. She has no wheezes. She has no rales. She exhibits no tenderness.  Abdominal: Soft. Bowel sounds are normal. She exhibits no distension and no mass. There  is no tenderness. There is no rebound and no guarding. No hernia.  Musculoskeletal: She exhibits no edema.  Neurological: She is alert and oriented to person, place, and time.  Skin: Skin is warm and dry.  Psychiatric: She has a normal mood and affect. Her behavior is normal.  Nursing note and vitals reviewed.    ED Treatments / Results  Labs (all labs ordered are listed, but only abnormal results are displayed) Labs Reviewed  COMPREHENSIVE METABOLIC PANEL - Abnormal; Notable for the following:       Result Value   Glucose, Bld 112 (*)    All other components within normal limits  CBC WITH DIFFERENTIAL/PLATELET - Abnormal; Notable for the following:    WBC 13.9 (*)    Hemoglobin 11.5 (*)    HCT 35.0 (*)    Neutro Abs 10.3 (*)    All other components within normal limits  HCG, QUANTITATIVE, PREGNANCY - Abnormal; Notable for the following:    hCG,  Beta Chain, Quant, S 172 (*)    All other components within normal limits  I-STAT BETA HCG BLOOD, ED ([facility], [facility], [facility] ONLY) - Abnormal; Notable for the following:    I-stat hCG, quantitative 142.5 (*)    All other components within normal limits  URINALYSIS, ROUTINE W REFLEX MICROSCOPIC (NOT AT [facility])  I-STAT CG4 LACTIC ACID, ED    EKG  EKG Interpretation None       Radiology No results found.  Procedures Procedures (including critical care time)  Medications Ordered in ED Medications  metoCLOPramide (REGLAN) injection 10 mg (10 mg Intravenous Given 06/03/16 0744)     Initial Impression / Assessment and Plan / ED Course  I have reviewed the triage vital signs and the nursing notes.  Pertinent labs & imaging results that were available during my care of the patient were reviewed by me and considered in my medical decision making (see chart for details).  Clinical Course   35 year old female presents with symptoms consistent with sinusitis. She is already on appropriate antibiotics. She is mildly hypertensive in the ED but afebrile and not tachycardic. CBC remarkable for leukocytosis of 13.9 and mild anemia. CMP unremarkable. Lactic acid is normal. hcg quant is 172 and seems to be rising appropriately. Reglan given for nausea and patient reports improvement.   Patient is requesting to be discharged since she feels better. UA not completed - patient does not have dysuria. Reglan and Clotrimazole cream rx'ed. Advised continue antibiotics and Tylenol prn for fever/pain. Follow up with PCP and OBGYN as needed. Patient is NAD, non-toxic, with stable VS. Patient is informed of clinical course, understands medical decision making process, and agrees with plan. Opportunity for questions provided and all questions answered. Return precautions given.   Final Clinical Impressions(s) / ED Diagnoses   Final diagnoses:  Acute maxillary sinusitis, recurrence not specified  Vaginal itching     New Prescriptions New Prescriptions   CLOTRIMAZOLE (GYNE-LOTRIMIN) 1 % VAGINAL CREAM    Place 1 Applicatorful vaginally at bedtime.   METOCLOPRAMIDE (REGLAN) 10 MG TABLET    Take 1 tablet (10 mg total) by mouth every 6 (six) hours.     Recardo Evangelist, PA-C 06/03/16 [place], DO 06/07/16 1649

## 2016-06-03 NOTE — Discharge Instructions (Signed)
Please ask your OB/GYN regarding progesterone suppository.

## 2016-06-09 ENCOUNTER — Inpatient Hospital Stay (HOSPITAL_COMMUNITY)
Admission: AD | Admit: 2016-06-09 | Discharge: 2016-06-09 | Disposition: A | Discharge status: Home / Self Care | Payer: Managed Care, Other (non HMO) | Source: Ambulatory Visit | Attending: Obstetrics and Gynecology | Admitting: Obstetrics and Gynecology

## 2016-06-09 ENCOUNTER — Inpatient Hospital Stay (HOSPITAL_COMMUNITY): Payer: Managed Care, Other (non HMO)

## 2016-06-09 ENCOUNTER — Encounter: Payer: Self-pay | Admitting: Student

## 2016-06-09 DIAGNOSIS — O9989 Other specified diseases and conditions complicating pregnancy, childbirth and the puerperium: Secondary | ICD-10-CM | POA: Diagnosis not present

## 2016-06-09 DIAGNOSIS — O26891 Other specified pregnancy related conditions, first trimester: Secondary | ICD-10-CM | POA: Insufficient documentation

## 2016-06-09 DIAGNOSIS — R109 Unspecified abdominal pain: Secondary | ICD-10-CM | POA: Diagnosis not present

## 2016-06-09 DIAGNOSIS — O3680X0 Pregnancy with inconclusive fetal viability, not applicable or unspecified: Secondary | ICD-10-CM

## 2016-06-09 DIAGNOSIS — Z3A01 Less than 8 weeks gestation of pregnancy: Secondary | ICD-10-CM | POA: Diagnosis not present

## 2016-06-09 DIAGNOSIS — O26899 Other specified pregnancy related conditions, unspecified trimester: Secondary | ICD-10-CM

## 2016-06-09 LAB — CBC
HCT: 31.8 % — ABNORMAL LOW (ref 36.0–46.0)
Hemoglobin: 10.8 g/dL — ABNORMAL LOW (ref 12.0–15.0)
MCH: 29.5 pg (ref 26.0–34.0)
MCHC: 34 g/dL (ref 30.0–36.0)
MCV: 86.9 fL (ref 78.0–100.0)
Platelets: 322 10*3/uL (ref 150–400)
RBC: 3.66 MIL/uL — ABNORMAL LOW (ref 3.87–5.11)
RDW: 15.3 % (ref 11.5–15.5)
WBC: 14.7 10*3/uL — ABNORMAL HIGH (ref 4.0–10.5)

## 2016-06-09 LAB — URINALYSIS, ROUTINE W REFLEX MICROSCOPIC
Bilirubin Urine: NEGATIVE
Glucose, UA: NEGATIVE mg/dL
Hgb urine dipstick: NEGATIVE
Ketones, ur: 15 mg/dL — AB
Nitrite: NEGATIVE
Protein, ur: NEGATIVE mg/dL
Specific Gravity, Urine: 1.025 (ref 1.005–1.030)
pH: 6 (ref 5.0–8.0)

## 2016-06-09 LAB — URINE MICROSCOPIC-ADD ON

## 2016-06-09 LAB — HCG, QUANTITATIVE, PREGNANCY: hCG, Beta Chain, Quant, S: 2532 m[IU]/mL — ABNORMAL HIGH (ref <5)

## 2016-06-09 NOTE — Discharge Instructions (Signed)

## 2016-06-09 NOTE — MAU Note (Signed)
Having some lower abd pain since 1500 like someone wrenching my intestines. Vomited x 1. Denies bleeding. On progestone supp. On antibiotics for 2wks due to flu-like symptoms but flu swab negative. Take last dose antibiotics tomorrow.

## 2016-06-09 NOTE — MAU Provider Note (Signed)
History     CSN: [id number]  Arrival date and time: 06/09/16 2111   First Provider Initiated Contact with Patient 06/09/16 2217      Chief Complaint  Patient presents with  . Abdominal Cramping  . Nausea   HPI  Tracey Beard is a 35 y.o. G1P0 at [redacted]w[redacted]d by LMP who presents with abdominal cramping & nausea. Abdominal pain started at 3 pm today. Reports lower abdominal cramping & "wrenching" pain; pain is constant. Rates pain 4/10. Has not treated. No aggravating or alleviating factors. Endorses nausea and has vomited once today. Has noticed loose stools since being on abx for URI.  Denies vaginal bleeding, vaginal discharge, dysuria, or fever. Goes to [facility]; had ultrasound last week but was told it was too early to see anything. Placed on vaginal progesterone.   OB History    Gravida Para Term Preterm AB Living   1             SAB TAB Ectopic Multiple Live Births                  Past Medical History:  Diagnosis Date  . Chronic back pain   . Clinical depression 12/05/2013  . Headache   . Idiopathic intracranial hypertension   . Leukocytosis 02/25/2015    Past Surgical History:  Procedure Laterality Date  . NO PAST SURGERIES      No family history on file.  Social History  Substance Use Topics  . Smoking status: Never Smoker  . Smokeless tobacco: Never Used  . Alcohol use Yes     Comment: socially    Allergies:  Allergies  Allergen Reactions  . Elavil [Amitriptyline Hcl] Shortness Of Breath  . Trazodone And Nefazodone Anaphylaxis  . Diamox [Acetazolamide] Other (See Comments)    Neuropathy, cold chills, loss of taste.  . Maxalt [Rizatriptan Benzoate] Other (See Comments)    Chest tightness   . Benzocaine Swelling and Rash    Discoloration to area of exposure  . Caine-1 [Lidocaine Hcl] Swelling and Rash    ( pt states allergic to Caines in general)  . Latex Rash  . Novocain [Procaine] Swelling and Rash    Discoloration to area of exposure     Prescriptions Prior to Admission  Medication Sig Dispense Refill Last Dose  . acetaminophen (TYLENOL) 500 MG tablet Take 1,000 mg by mouth every 4 (four) hours as needed for mild pain, moderate pain or headache.    Past Month at Unknown time  . azithromycin (ZITHROMAX Z-PAK) 250 MG tablet Take 250 mg by mouth daily.   06/09/2016 at Unknown time  . BIOTIN PO Take 1 tablet by mouth daily.   06/08/2016 at Unknown time  . clotrimazole (GYNE-LOTRIMIN) 1 % vaginal cream Place 1 Applicatorful vaginally at bedtime. 45 g 0 Past Month at Unknown time  . esomeprazole (NEXIUM) 20 MG capsule Take 20 mg by mouth daily at 12 noon.   06/08/2016 at Unknown time  . metoCLOPramide (REGLAN) 10 MG tablet Take 1 tablet (10 mg total) by mouth every 6 (six) hours. 10 tablet 0 Past Month at Unknown time  . Prenatal MV-Min-Fe Fum-FA-DHA (PRENATAL+DHA PO) Take 1 tablet by mouth daily. Prenatal combo pack. Take the iron and dha tablets once daily an the multivitamin twice daily    06/09/2016 at Unknown time  . Progesterone 50 MG SUPP Place 50 mg vaginally 2 (two) times daily.   06/09/2016 at Unknown time    Review of  Systems  Constitutional: Negative for chills and fever.  Gastrointestinal: Positive for abdominal pain, nausea and vomiting. Negative for constipation and diarrhea.  Genitourinary: Negative.    Physical Exam   Blood pressure 135/85, pulse 94, temperature 99.4 F (37.4 C), temperature source Oral, resp. rate 18, height 5\' 1"  (1.549 m), weight 208 lb 1.9 oz (94.4 kg), last menstrual period 05/03/2016.  Physical Exam  Nursing note and vitals reviewed. Constitutional: She is oriented to person, place, and time. She appears well-developed and well-nourished. No distress.  HENT:  Head: Normocephalic and atraumatic.  Eyes: Conjunctivae are normal. Right eye exhibits no discharge. Left eye exhibits no discharge. No scleral icterus.  Neck: Normal range of motion.  Cardiovascular: Normal rate, regular rhythm  and normal heart sounds.   No murmur heard. Respiratory: Effort normal and breath sounds normal. No respiratory distress. She has no wheezes.  GI: Soft. Bowel sounds are normal. She exhibits no distension. There is no tenderness. There is no rebound.  Neurological: She is alert and oriented to person, place, and time.  Skin: Skin is warm and dry. She is not diaphoretic.  Psychiatric: She has a normal mood and affect. Her behavior is normal. Judgment and thought content normal.    MAU Course  Procedures Results for orders placed or performed during the hospital encounter of 06/09/16 (from the past 24 hour(s))  Urinalysis, Routine w reflex microscopic (not at [facility])     Status: Abnormal   Collection Time: 06/09/16  9:30 PM  Result Value Ref Range   Color, Urine YELLOW YELLOW   APPearance CLEAR CLEAR   Specific Gravity, Urine 1.025 1.005 - 1.030   pH 6.0 5.0 - 8.0   Glucose, UA NEGATIVE NEGATIVE mg/dL   Hgb urine dipstick NEGATIVE NEGATIVE   Bilirubin Urine NEGATIVE NEGATIVE   Ketones, ur 15 (A) NEGATIVE mg/dL   Protein, ur NEGATIVE NEGATIVE mg/dL   Nitrite NEGATIVE NEGATIVE   Leukocytes, UA SMALL (A) NEGATIVE  Urine microscopic-add on     Status: Abnormal   Collection Time: 06/09/16  9:30 PM  Result Value Ref Range   Squamous Epithelial / LPF 6-30 (A) NONE SEEN   WBC, UA 0-5 0 - 5 WBC/hpf   RBC / HPF 0-5 0 - 5 RBC/hpf   Bacteria, UA FEW (A) NONE SEEN   Urine-Other MUCOUS PRESENT   CBC     Status: Abnormal   Collection Time: 06/09/16  9:58 PM  Result Value Ref Range   WBC 14.7 (H) 4.0 - 10.5 K/uL   RBC 3.66 (L) 3.87 - 5.11 MIL/uL   Hemoglobin 10.8 (L) 12.0 - 15.0 g/dL   HCT 31.8 (L) 36.0 - 46.0 %   MCV 86.9 78.0 - 100.0 fL   MCH 29.5 26.0 - 34.0 pg   MCHC 34.0 30.0 - 36.0 g/dL   RDW 15.3 11.5 - 15.5 %   Platelets 322 150 - 400 K/uL  ABO/Rh     Status: None (Preliminary result)   Collection Time: 06/09/16  9:58 PM  Result Value Ref Range   ABO/RH(D) AB POS   hCG,  quantitative, pregnancy     Status: Abnormal   Collection Time: 06/09/16  9:58 PM  Result Value Ref Range   hCG, Beta Chain, Quant, S 2,532 (H) <5 mIU/mL   US Ob Comp Less 14 Wks  Result Date: 06/09/2016 CLINICAL DATA:  Low abdominal pain. Estimated gestational age by LMP is 5 weeks 2 days. Quantitative beta HCG is not provided. EXAM: OBSTETRIC <14  WK [place] AND TRANSVAGINAL OB US TECHNIQUE: Both transabdominal and transvaginal ultrasound examinations were performed for complete evaluation of the gestation as well as the maternal uterus, adnexal regions, and pelvic cul-de-sac. Transvaginal technique was performed to assess early pregnancy. COMPARISON:  None. FINDINGS: Intrauterine gestational sac: A single intrauterine gestational sac is visualized. Yolk sac:  Not identified. Embryo:  Not identified. Cardiac Activity: Not identified. MSD: 6.5  mm   5 w   2  d Subchorionic hemorrhage:  None visualized. Maternal uterus/adnexae: Uterus is anteverted. Circumscribed hypoechoic lesion in the anterior myometrium measuring 1.7 cm maximal diameter consistent with a fibroid. Both ovaries are visualized and appear normal. No free fluid in the pelvis. IMPRESSION: Probable early intrauterine gestational sac, but no yolk sac, fetal pole, or cardiac activity yet visualized. Recommend follow-up quantitative B-HCG levels and follow-up US in 14 days to confirm and assess viability. This recommendation follows SRU consensus guidelines: Diagnostic Criteria for Nonviable Pregnancy Early in the First Trimester. [place] Med [id number]. Electronically Signed   By: Lucienne Capers M.D.   On: 06/09/2016 23:24   US Ob Transvaginal  Result Date: 06/09/2016 CLINICAL DATA:  Low abdominal pain. Estimated gestational age by LMP is 5 weeks 2 days. Quantitative beta HCG is not provided. EXAM: OBSTETRIC <14 WK [place] AND TRANSVAGINAL OB US TECHNIQUE: Both transabdominal and transvaginal ultrasound examinations were performed for complete  evaluation of the gestation as well as the maternal uterus, adnexal regions, and pelvic cul-de-sac. Transvaginal technique was performed to assess early pregnancy. COMPARISON:  None. FINDINGS: Intrauterine gestational sac: A single intrauterine gestational sac is visualized. Yolk sac:  Not identified. Embryo:  Not identified. Cardiac Activity: Not identified. MSD: 6.5  mm   5 w   2  d Subchorionic hemorrhage:  None visualized. Maternal uterus/adnexae: Uterus is anteverted. Circumscribed hypoechoic lesion in the anterior myometrium measuring 1.7 cm maximal diameter consistent with a fibroid. Both ovaries are visualized and appear normal. No free fluid in the pelvis. IMPRESSION: Probable early intrauterine gestational sac, but no yolk sac, fetal pole, or cardiac activity yet visualized. Recommend follow-up quantitative B-HCG levels and follow-up US in 14 days to confirm and assess viability. This recommendation follows SRU consensus guidelines: Diagnostic Criteria for Nonviable Pregnancy Early in the First Trimester. [employer] Med [id number]. Electronically Signed   By: Lucienne Capers M.D.   On: 06/09/2016 23:24     MDM CBC, BHCG, abo/rh, ultrasound Bhcg 2532 Ultrasound shows IUGS, no yolk sac, no adnexal mass S/w Dr. Helane Rima. Pt to return to office on Monday for BHCG This abdominal pain could represent a normal pregnancy, spontaneous abortion, or even an ectopic pregnancy which can be life-threatening.   Assessment and Plan  A: 1. Pregnancy of unknown anatomic location   2. Abdominal pain affecting pregnancy     P; Discharge home Call office Monday morning for BHCG  Discussed reasons to return to [place] 06/09/2016, 10:17 PM

## 2016-06-10 LAB — ABO/RH: ABO/RH(D): AB POS

## 2016-07-07 ENCOUNTER — Inpatient Hospital Stay (HOSPITAL_COMMUNITY)
Admission: AD | Admit: 2016-07-07 | Discharge: 2016-07-07 | Disposition: A | Discharge status: Home / Self Care | Payer: Managed Care, Other (non HMO) | Source: Ambulatory Visit | Attending: Obstetrics and Gynecology | Admitting: Obstetrics and Gynecology

## 2016-07-07 ENCOUNTER — Inpatient Hospital Stay (HOSPITAL_COMMUNITY): Payer: Managed Care, Other (non HMO)

## 2016-07-07 ENCOUNTER — Encounter (HOSPITAL_COMMUNITY): Payer: Self-pay | Admitting: *Deleted

## 2016-07-07 DIAGNOSIS — D259 Leiomyoma of uterus, unspecified: Secondary | ICD-10-CM | POA: Diagnosis not present

## 2016-07-07 DIAGNOSIS — O3411 Maternal care for benign tumor of corpus uteri, first trimester: Secondary | ICD-10-CM

## 2016-07-07 DIAGNOSIS — O26891 Other specified pregnancy related conditions, first trimester: Secondary | ICD-10-CM

## 2016-07-07 DIAGNOSIS — O98811 Other maternal infectious and parasitic diseases complicating pregnancy, first trimester: Secondary | ICD-10-CM | POA: Diagnosis not present

## 2016-07-07 DIAGNOSIS — Z3A09 9 weeks gestation of pregnancy: Secondary | ICD-10-CM | POA: Insufficient documentation

## 2016-07-07 DIAGNOSIS — B3731 Acute candidiasis of vulva and vagina: Secondary | ICD-10-CM

## 2016-07-07 DIAGNOSIS — B373 Candidiasis of vulva and vagina: Secondary | ICD-10-CM | POA: Diagnosis not present

## 2016-07-07 DIAGNOSIS — O26899 Other specified pregnancy related conditions, unspecified trimester: Secondary | ICD-10-CM

## 2016-07-07 DIAGNOSIS — R109 Unspecified abdominal pain: Secondary | ICD-10-CM

## 2016-07-07 DIAGNOSIS — R102 Pelvic and perineal pain: Secondary | ICD-10-CM | POA: Insufficient documentation

## 2016-07-07 DIAGNOSIS — Z3491 Encounter for supervision of normal pregnancy, unspecified, first trimester: Secondary | ICD-10-CM

## 2016-07-07 LAB — URINALYSIS, ROUTINE W REFLEX MICROSCOPIC
Bilirubin Urine: NEGATIVE
Glucose, UA: NEGATIVE mg/dL
Hgb urine dipstick: NEGATIVE
Ketones, ur: 15 mg/dL — AB
Leukocytes, UA: NEGATIVE
Nitrite: NEGATIVE
Protein, ur: NEGATIVE mg/dL
Specific Gravity, Urine: 1.025 (ref 1.005–1.030)
pH: 5.5 (ref 5.0–8.0)

## 2016-07-07 LAB — CBC
HCT: 33.2 % — ABNORMAL LOW (ref 36.0–46.0)
Hemoglobin: 11.3 g/dL — ABNORMAL LOW (ref 12.0–15.0)
MCH: 29.9 pg (ref 26.0–34.0)
MCHC: 34 g/dL (ref 30.0–36.0)
MCV: 87.8 fL (ref 78.0–100.0)
Platelets: 309 10*3/uL (ref 150–400)
RBC: 3.78 MIL/uL — ABNORMAL LOW (ref 3.87–5.11)
RDW: 15.1 % (ref 11.5–15.5)
WBC: 14.5 10*3/uL — ABNORMAL HIGH (ref 4.0–10.5)

## 2016-07-07 LAB — WET PREP, GENITAL
Sperm: NONE SEEN
Trich, Wet Prep: NONE SEEN

## 2016-07-07 MED ORDER — PROMETHAZINE HCL 25 MG/ML IJ SOLN
25.0000 mg | Freq: Once | INTRAMUSCULAR | Status: AC
  Administered 2016-07-07: 25 mg via INTRAVENOUS
  Filled 2016-07-07: qty 1

## 2016-07-07 MED ORDER — LACTATED RINGERS IV BOLUS (SEPSIS)
1000.0000 mL | Freq: Once | INTRAVENOUS | Status: AC
  Administered 2016-07-07: 1000 mL via INTRAVENOUS

## 2016-07-07 MED ORDER — TERCONAZOLE 0.4 % VA CREA: 1.0000 | TOPICAL_CREAM | Freq: Every day | VAGINAL | 0 refills | Status: DC

## 2016-07-07 MED ORDER — OXYCODONE-ACETAMINOPHEN 5-325 MG PO TABS: 1.0000 | ORAL_TABLET | Freq: Four times a day (QID) | ORAL | 0 refills | Status: DC | PRN

## 2016-07-07 NOTE — MAU Provider Note (Signed)
Chief Complaint: Abdominal Pain   First Provider Initiated Contact with Patient 07/07/16 1705      SUBJECTIVE HPI: Tracey Beard is a 35 y.o. G1P0 at [redacted]w[redacted]d by LMP who presents to maternity admissions reporting stabbing sharp abdominal pain with onset 2 days ago. She reports some sharp pain earlier in pregnancy that was mild but now her pain is much more severe.  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 She has not tried any medications, it is not improved by rest/heat/position change.  The pain is acute, new onset, worsening rapidly.  She has nausea and vomiting but this is unchanged in last several weeks.  She had [place] in the office on 10/5 confirming IUP at [redacted]w[redacted]d. She is on vaginal progesterone for low progesterone levels measured in the office.  She was treated for a yeast infection 2 weeks ago but denies current symptoms. She denies vaginal bleeding, vaginal itching/burning, urinary symptoms, h/a, dizziness, n/v, or fever/chills.     HPI  Past Medical History:  Diagnosis Date  . Chronic back pain   . Clinical depression 12/05/2013  . Headache   . Idiopathic intracranial hypertension   . Leukocytosis 02/25/2015   Past Surgical History:  Procedure Laterality Date  . NO PAST SURGERIES     Social History   Social History  . Marital status: Married    Spouse name: N/A  . Number of children: N/A  . Years of education: N/A   Occupational History  . Not on file.   Social History Main Topics  . Smoking status: Never Smoker  . Smokeless tobacco: Never Used  . Alcohol use Yes     Comment: socially  . Drug use: No  . Sexual activity: No   Other Topics Concern  . Not on file   Social History Narrative  . No narrative on file   No current facility-administered medications on file prior to encounter.    Current Outpatient Prescriptions on File Prior to Encounter  Medication Sig Dispense Refill  . acetaminophen (TYLENOL) 500 MG tablet Take 1,000 mg by mouth  every 4 (four) hours as needed for mild pain, moderate pain or headache.     . esomeprazole (NEXIUM) 20 MG capsule Take 20 mg by mouth daily as needed (heartburn).     . Prenatal MV-Min-Fe Fum-FA-DHA (PRENATAL+DHA PO) Take 1 tablet by mouth daily.     . Progesterone 50 MG SUPP Place 50 mg vaginally 2 (two) times daily.     Allergies  Allergen Reactions  . Elavil [Amitriptyline Hcl] Shortness Of Breath  . Trazodone And Nefazodone Anaphylaxis  . Diamox [Acetazolamide] Other (See Comments)    Neuropathy, cold chills, loss of taste.  . Maxalt [Rizatriptan Benzoate] Other (See Comments)    Chest tightness   . Benzocaine Swelling and Rash    Discoloration to area of exposure  . Caine-1 [Lidocaine Hcl] Swelling and Rash    ( pt states allergic to Caines in general)  . Latex Rash  . Novocain [Procaine] Swelling and Rash    Discoloration to area of exposure    ROS:  Review of Systems  Constitutional: Negative for chills, fatigue and fever.  Respiratory: Negative for shortness of breath.   Cardiovascular: Negative for chest pain.  Gastrointestinal: Positive for abdominal pain.  Genitourinary: Positive for pelvic pain. Negative for difficulty urinating, dysuria, flank pain, vaginal bleeding, vaginal discharge and vaginal pain.  Neurological: Negative for dizziness and headaches.  Psychiatric/Behavioral: Negative.  I have reviewed patient's Past Medical Hx, Surgical Hx, Family Hx, Social Hx, medications and allergies.   Physical Exam   Patient Vitals for the past 24 hrs:  BP Temp Temp src Pulse Resp  07/07/16 1841 132/86 - - 83 18  07/07/16 1611 154/75 98.6 F (37 C) Oral 100 18   Constitutional: Well-developed, well-nourished female in no acute distress.  Cardiovascular: normal rate Respiratory: normal effort GI: Abd soft, non-tender. Pos BS x 4 MS: Extremities nontender, no edema, normal ROM Neurologic: Alert and oriented x 4.  GU: Neg CVAT.  PELVIC EXAM: Cervix pink,  visually closed, without lesion, scant white creamy discharge, vaginal walls and external genitalia normal Bimanual exam: Cervix 0/long/high, firm, anterior, neg CMT, uterus nontender, nonenlarged, adnexa without tenderness, enlargement, or mass   LAB RESULTS Results for orders placed or performed during the hospital encounter of 07/07/16 (from the past 24 hour(s))  Urinalysis, Routine w reflex microscopic (not at [facility])     Status: Abnormal   Collection Time: 07/07/16  3:50 PM  Result Value Ref Range   Color, Urine YELLOW YELLOW   APPearance CLEAR CLEAR   Specific Gravity, Urine 1.025 1.005 - 1.030   pH 5.5 5.0 - 8.0   Glucose, UA NEGATIVE NEGATIVE mg/dL   Hgb urine dipstick NEGATIVE NEGATIVE   Bilirubin Urine NEGATIVE NEGATIVE   Ketones, ur 15 (A) NEGATIVE mg/dL   Protein, ur NEGATIVE NEGATIVE mg/dL   Nitrite NEGATIVE NEGATIVE   Leukocytes, UA NEGATIVE NEGATIVE  CBC     Status: Abnormal   Collection Time: 07/07/16  4:58 PM  Result Value Ref Range   WBC 14.5 (H) 4.0 - 10.5 K/uL   RBC 3.78 (L) 3.87 - 5.11 MIL/uL   Hemoglobin 11.3 (L) 12.0 - 15.0 g/dL   HCT 33.2 (L) 36.0 - 46.0 %   MCV 87.8 78.0 - 100.0 fL   MCH 29.9 26.0 - 34.0 pg   MCHC 34.0 30.0 - 36.0 g/dL   RDW 15.1 11.5 - 15.5 %   Platelets 309 150 - 400 K/uL  Wet prep, genital     Status: Abnormal   Collection Time: 07/07/16  5:00 PM  Result Value Ref Range   Yeast Wet Prep HPF POC PRESENT (A) NONE SEEN   Trich, Wet Prep NONE SEEN NONE SEEN   Clue Cells Wet Prep HPF POC PRESENT (A) NONE SEEN   WBC, Wet Prep HPF POC MODERATE (A) NONE SEEN   Sperm NONE SEEN     --/--/AB POS (09/22 2158)  IMAGING US Ob Comp Less 14 Wks  Result Date: 06/09/2016 CLINICAL DATA:  Low abdominal pain. Estimated gestational age by LMP is 5 weeks 2 days. Quantitative beta HCG is not provided. EXAM: OBSTETRIC <14 WK [place] AND TRANSVAGINAL OB US TECHNIQUE: Both transabdominal and transvaginal ultrasound examinations were performed for complete  evaluation of the gestation as well as the maternal uterus, adnexal regions, and pelvic cul-de-sac. Transvaginal technique was performed to assess early pregnancy. COMPARISON:  None. FINDINGS: Intrauterine gestational sac: A single intrauterine gestational sac is visualized. Yolk sac:  Not identified. Embryo:  Not identified. Cardiac Activity: Not identified. MSD: 6.5  mm   5 w   2  d Subchorionic hemorrhage:  None visualized. Maternal uterus/adnexae: Uterus is anteverted. Circumscribed hypoechoic lesion in the anterior myometrium measuring 1.7 cm maximal diameter consistent with a fibroid. Both ovaries are visualized and appear normal. No free fluid in the pelvis. IMPRESSION: Probable early intrauterine gestational sac, but no yolk sac, fetal  pole, or cardiac activity yet visualized. Recommend follow-up quantitative B-HCG levels and follow-up US in 14 days to confirm and assess viability. This recommendation follows SRU consensus guidelines: Diagnostic Criteria for Nonviable Pregnancy Early in the First Trimester. [employer] Med [id number]. Electronically Signed   By: Lucienne Capers M.D.   On: 06/09/2016 23:24   US Ob Transvaginal  Result Date: 07/07/2016 CLINICAL DATA:  Pelvic pain in first-trimester pregnancy. EXAM: OBSTETRIC <14 WK TRANSVAGINAL OB US DOPPLER ULTRASOUND OF OVARIES TECHNIQUE: Transvaginal ultrasound examinations were performed for complete evaluation of the gestation as well as the maternal uterus, adnexal regions, and pelvic cul-de-sac. Transvaginal technique was performed to assess early pregnancy. Color and duplex Doppler ultrasound was utilized to evaluate blood flow to the ovaries. COMPARISON:  06/09/2016 FINDINGS: Intrauterine gestational sac: Single Yolk sac:  No longer visualized Embryo:  Visualized. Cardiac Activity: Visualized. Heart Rate: 193 bpm CRL:   22  mm   8 w 5 d                  [place] EDC: 02/11/2017 Subchorionic hemorrhage:  None visualized. Maternal uterus/adnexae:  Hypoechoic mass in the anterior uterine body that is near the endometrium, also seen previously, 15 mm in maximal dimension. Symmetric normal ovarian size when accounting for corpus luteum on the left. Pulsed Doppler evaluation of both ovaries demonstrates normal appearing low-resistance arterial and venous waveforms. IMPRESSION: 1. Single living intrauterine pregnancy measuring 8 weeks 5 days. 2. Normal ovaries with corpus luteum on the left. 3. 15 mm intramural fibroid. Electronically Signed   By: Monte Fantasia M.D.   On: 07/07/2016 18:17   US Ob Transvaginal  Result Date: 06/09/2016 CLINICAL DATA:  Low abdominal pain. Estimated gestational age by LMP is 5 weeks 2 days. Quantitative beta HCG is not provided. EXAM: OBSTETRIC <14 WK [place] AND TRANSVAGINAL OB US TECHNIQUE: Both transabdominal and transvaginal ultrasound examinations were performed for complete evaluation of the gestation as well as the maternal uterus, adnexal regions, and pelvic cul-de-sac. Transvaginal technique was performed to assess early pregnancy. COMPARISON:  None. FINDINGS: Intrauterine gestational sac: A single intrauterine gestational sac is visualized. Yolk sac:  Not identified. Embryo:  Not identified. Cardiac Activity: Not identified. MSD: 6.5  mm   5 w   2  d Subchorionic hemorrhage:  None visualized. Maternal uterus/adnexae: Uterus is anteverted. Circumscribed hypoechoic lesion in the anterior myometrium measuring 1.7 cm maximal diameter consistent with a fibroid. Both ovaries are visualized and appear normal. No free fluid in the pelvis. IMPRESSION: Probable early intrauterine gestational sac, but no yolk sac, fetal pole, or cardiac activity yet visualized. Recommend follow-up quantitative B-HCG levels and follow-up US in 14 days to confirm and assess viability. This recommendation follows SRU consensus guidelines: Diagnostic Criteria for Nonviable Pregnancy Early in the First Trimester. [employer] Med [id number].  Electronically Signed   By: Lucienne Capers M.D.   On: 06/09/2016 23:24   [place] Art/ven Flow Abd Pelv Doppler  Result Date: 07/07/2016 CLINICAL DATA:  Pelvic pain in first-trimester pregnancy. EXAM: OBSTETRIC <14 WK TRANSVAGINAL OB US DOPPLER ULTRASOUND OF OVARIES TECHNIQUE: Transvaginal ultrasound examinations were performed for complete evaluation of the gestation as well as the maternal uterus, adnexal regions, and pelvic cul-de-sac. Transvaginal technique was performed to assess early pregnancy. Color and duplex Doppler ultrasound was utilized to evaluate blood flow to the ovaries. COMPARISON:  06/09/2016 FINDINGS: Intrauterine gestational sac: Single Yolk sac:  No longer visualized Embryo:  Visualized. Cardiac Activity: Visualized. Heart  Rate: 193 bpm CRL:   22  mm   8 w 5 d                  [place] EDC: 02/11/2017 Subchorionic hemorrhage:  None visualized. Maternal uterus/adnexae: Hypoechoic mass in the anterior uterine body that is near the endometrium, also seen previously, 15 mm in maximal dimension. Symmetric normal ovarian size when accounting for corpus luteum on the left. Pulsed Doppler evaluation of both ovaries demonstrates normal appearing low-resistance arterial and venous waveforms. IMPRESSION: 1. Single living intrauterine pregnancy measuring 8 weeks 5 days. 2. Normal ovaries with corpus luteum on the left. 3. 15 mm intramural fibroid. Electronically Signed   By: Monte Fantasia M.D.   On: 07/07/2016 18:17    MAU Management/MDM: Ordered labs and [place] and reviewed results.  Mild dehydration noted in UA and pt having daily nausea making PO fluids difficult.  LR x 1000 ml x 1.  [place] ordered r/t right pelvic/low abdominal pain.  No acute abdomen, no s/sx of appendicitis but [place] indicated to rule out ovarian cyst/torsion.  Ovaries wnl on today's [place].  IUP with normal FHR.  Small fibroid on right side, likely source of pt pain.  Consult Dr Gaetano Net. Treat pain with Rx for Percocet 5/325, take 1-2 W 6 hours  PRN x 10 tabs.  Rest/ice/heat also for pain.  Wet prep positive for yeast, pt treated recently in office. Rx for Terazol 7 sent to pharmacy as well.  F/U in office as scheduled.   Pt stable at time of discharge.  ASSESSMENT 1. Normal intrauterine pregnancy on prenatal ultrasound in first trimester   2. Pelvic pain affecting pregnancy in first trimester, antepartum   3. Uterine fibroids affecting pregnancy in first trimester   4. Abdominal pain affecting pregnancy   5. Vaginal candidiasis     PLAN Discharge home    Medication List    STOP taking these medications   metoCLOPramide 10 MG tablet Commonly known as:  REGLAN     TAKE these medications   acetaminophen 500 MG tablet Commonly known as:  TYLENOL Take 1,000 mg by mouth every 4 (four) hours as needed for mild pain, moderate pain or headache.   DICLEGIS 10-10 MG Tbec Generic drug:  Doxylamine-Pyridoxine Take 2 tablets by mouth at bedtime.   esomeprazole 20 MG capsule Commonly known as:  NEXIUM Take 20 mg by mouth daily as needed (heartburn).   OMEGA 3 PO Take 2 capsules by mouth 3 (three) times daily.   oxyCODONE-acetaminophen 5-325 MG tablet Commonly known as:  PERCOCET/ROXICET Take 1-2 tablets by mouth every 6 (six) hours as needed.   PRENATAL+DHA PO Take 1 tablet by mouth daily.   Progesterone 50 MG Supp Place 50 mg vaginally 2 (two) times daily.   terconazole 0.4 % vaginal cream Commonly known as:  TERAZOL 7 Place 1 applicator vaginally at bedtime.      Follow-up Information    TOMBLIN Marjean Donna, MD .   Specialty:  Obstetrics and Gynecology Why:  As scheduled, return to MAU as needed for emergencies Contact information: [address] [place] [address] [phone]           Fatima Blank Certified Nurse-Midwife 07/07/2016  7:38 PM

## 2016-07-07 NOTE — MAU Note (Signed)
URINE IN LAB 

## 2016-07-07 NOTE — Discharge Instructions (Signed)
Abdominal Pain During Pregnancy Abdominal pain is common in pregnancy. Most of the time, it does not cause harm. There are many causes of abdominal pain. Some causes are more serious than others. Some of the causes of abdominal pain in pregnancy are easily diagnosed. Occasionally, the diagnosis takes time to understand. Other times, the cause is not determined. Abdominal pain can be a sign that something is very wrong with the pregnancy, or the pain may have nothing to do with the pregnancy at all. For this reason, always tell your health care provider if you have any abdominal discomfort. HOME CARE INSTRUCTIONS  Monitor your abdominal pain for any changes. The following actions may help to alleviate any discomfort you are experiencing:  Do not have sexual intercourse or put anything in your vagina until your symptoms go away completely.  Get plenty of rest until your pain improves.  Drink clear fluids if you feel nauseous. Avoid solid food as long as you are uncomfortable or nauseous.  Only take over-the-counter or prescription medicine as directed by your health care provider.  Keep all follow-up appointments with your health care provider. SEEK IMMEDIATE MEDICAL CARE IF:  You are bleeding, leaking fluid, or passing tissue from the vagina.  You have increasing pain or cramping.  You have persistent vomiting.  You have painful or bloody urination.  You have a fever.  You notice a decrease in your baby's movements.  You have extreme weakness or feel faint.  You have shortness of breath, with or without abdominal pain.  You develop a severe headache with abdominal pain.  You have abnormal vaginal discharge with abdominal pain.  You have persistent diarrhea.  You have abdominal pain that continues even after rest, or gets worse. MAKE SURE YOU:   Understand these instructions.  Will watch your condition.  Will get help right away if you are not doing well or get worse.     This information is not intended to replace advice given to you by your health care provider. Make sure you discuss any questions you have with your health care provider.   Document Released: 09/04/2005 Document Revised: 06/25/2013 Document Reviewed: 04/03/2013 Elsevier Interactive Patient Education 2016 Elsevier Inc.  Uterine Fibroids Uterine fibroids are tissue masses (tumors) that can develop in the womb (uterus). They are also called leiomyomas. This type of tumor is not cancerous (benign) and does not spread to other parts of the body outside of the pelvic area, which is between the hip bones. Occasionally, fibroids may develop in the fallopian tubes, in the cervix, or on the support structures (ligaments) that surround the uterus. You can have one or many fibroids. Fibroids can vary in size, weight, and where they grow in the uterus. Some can become quite large. Most fibroids do not require medical treatment. CAUSES A fibroid can develop when a single uterine cell keeps growing (replicating). Most cells in the human body have a control mechanism that keeps them from replicating without control. SIGNS AND SYMPTOMS Symptoms may include:   Heavy bleeding during your period.  Bleeding or spotting between periods.  Pelvic pain and pressure.  Bladder problems, such as needing to urinate more often (urinary frequency) or urgently.  Inability to reproduce offspring (infertility).  Miscarriages. DIAGNOSIS Uterine fibroids are diagnosed through a physical exam. Your health care provider may feel the lumpy tumors during a pelvic exam. Ultrasonography and an MRI may be done to determine the size, location, and number of fibroids. TREATMENT Treatment may include:  Watchful waiting. This involves getting the fibroid checked by your health care provider to see if it grows or shrinks. Follow your health care provider's recommendations for how often to have this checked.  Hormone medicines.  These can be taken by mouth or given through an intrauterine device (IUD).  Surgery.  Removing the fibroids (myomectomy) or the uterus (hysterectomy).  Removing blood supply to the fibroids (uterine artery embolization). If fibroids interfere with your fertility and you want to become pregnant, your health care provider may recommend having the fibroids removed.  HOME CARE INSTRUCTIONS  Keep all follow-up visits as directed by your health care provider. This is important.  Take medicines only as directed by your health care provider.  If you were prescribed a hormone treatment, take the hormone medicines exactly as directed.  Do not take aspirin, because it can cause bleeding.  Ask your health care provider about taking iron pills and increasing the amount of dark green, leafy vegetables in your diet. These actions can help to boost your blood iron levels, which may be affected by heavy menstrual bleeding.  Pay close attention to your period and tell your health care provider about any changes, such as:  Increased blood flow that requires you to use more pads or tampons than usual per month.  A change in the number of days that your period lasts per month.  A change in symptoms that are associated with your period, such as abdominal cramping or back pain. SEEK MEDICAL CARE IF:  You have pelvic pain, back pain, or abdominal cramps that cannot be controlled with medicines.  You have an increase in bleeding between and during periods.  You soak tampons or pads in a half hour or less.  You feel lightheaded, extra tired, or weak. SEEK IMMEDIATE MEDICAL CARE IF:  You faint.  You have a sudden increase in pelvic pain.   This information is not intended to replace advice given to you by your health care provider. Make sure you discuss any questions you have with your health care provider.   Document Released: 09/01/2000 Document Revised: 09/25/2014 Document Reviewed:  03/03/2014 Elsevier Interactive Patient Education [employer].

## 2016-07-07 NOTE — MAU Note (Addendum)
Pt reports she started having a sharp lower abd pain that woke her up in the middle of the night 3 nights ago. Sharpness had gone away during the day.  Had a doctors appointment yesterday and they told her everything was normal. Sharpness woke her up again last night and is still there today. Has had n/v for over a week. Started on Diclegis yesterday has not helped yet. Denies any vag bleeding or discharge.

## 2016-07-10 LAB — GC/CHLAMYDIA PROBE AMP (~~LOC~~) NOT AT ARMC
Chlamydia: NEGATIVE
Neisseria Gonorrhea: NEGATIVE

## 2016-07-11 LAB — OB RESULTS CONSOLE GC/CHLAMYDIA
Chlamydia: NEGATIVE
Gonorrhea: NEGATIVE

## 2016-07-11 LAB — OB RESULTS CONSOLE ABO/RH: RH Type: POSITIVE

## 2016-07-11 LAB — OB RESULTS CONSOLE RPR: RPR: NONREACTIVE

## 2016-07-11 LAB — OB RESULTS CONSOLE RUBELLA ANTIBODY, IGM: Rubella: IMMUNE

## 2016-07-11 LAB — OB RESULTS CONSOLE HEPATITIS B SURFACE ANTIGEN: Hepatitis B Surface Ag: NEGATIVE

## 2016-07-11 LAB — OB RESULTS CONSOLE HIV ANTIBODY (ROUTINE TESTING): HIV: NONREACTIVE

## 2016-07-11 LAB — OB RESULTS CONSOLE ANTIBODY SCREEN: Antibody Screen: NEGATIVE

## 2016-07-22 ENCOUNTER — Encounter (HOSPITAL_COMMUNITY): Payer: Self-pay | Admitting: *Deleted

## 2016-07-22 ENCOUNTER — Inpatient Hospital Stay (HOSPITAL_COMMUNITY): Payer: Managed Care, Other (non HMO)

## 2016-07-22 ENCOUNTER — Inpatient Hospital Stay (HOSPITAL_COMMUNITY)
Admission: AD | Admit: 2016-07-22 | Discharge: 2016-07-23 | Disposition: A | Discharge status: Home / Self Care | Payer: Managed Care, Other (non HMO) | Source: Ambulatory Visit | Attending: Emergency Medicine | Admitting: Emergency Medicine

## 2016-07-22 DIAGNOSIS — Z3A08 8 weeks gestation of pregnancy: Secondary | ICD-10-CM | POA: Insufficient documentation

## 2016-07-22 DIAGNOSIS — K760 Fatty (change of) liver, not elsewhere classified: Secondary | ICD-10-CM

## 2016-07-22 DIAGNOSIS — O26611 Liver and biliary tract disorders in pregnancy, first trimester: Secondary | ICD-10-CM | POA: Insufficient documentation

## 2016-07-22 DIAGNOSIS — R1013 Epigastric pain: Secondary | ICD-10-CM | POA: Diagnosis not present

## 2016-07-22 DIAGNOSIS — Z91040 Latex allergy status: Secondary | ICD-10-CM | POA: Diagnosis not present

## 2016-07-22 DIAGNOSIS — O26891 Other specified pregnancy related conditions, first trimester: Secondary | ICD-10-CM | POA: Diagnosis present

## 2016-07-22 DIAGNOSIS — O219 Vomiting of pregnancy, unspecified: Secondary | ICD-10-CM

## 2016-07-22 DIAGNOSIS — R1011 Right upper quadrant pain: Secondary | ICD-10-CM

## 2016-07-22 LAB — CBC
HCT: 35.4 % — ABNORMAL LOW (ref 36.0–46.0)
Hemoglobin: 12.3 g/dL (ref 12.0–15.0)
MCH: 30.1 pg (ref 26.0–34.0)
MCHC: 34.7 g/dL (ref 30.0–36.0)
MCV: 86.8 fL (ref 78.0–100.0)
Platelets: 316 10*3/uL (ref 150–400)
RBC: 4.08 MIL/uL (ref 3.87–5.11)
RDW: 14.8 % (ref 11.5–15.5)
WBC: 15.9 10*3/uL — ABNORMAL HIGH (ref 4.0–10.5)

## 2016-07-22 LAB — COMPREHENSIVE METABOLIC PANEL
ALT: 23 U/L (ref 14–54)
AST: 25 U/L (ref 15–41)
Albumin: 4.4 g/dL (ref 3.5–5.0)
Alkaline Phosphatase: 48 U/L (ref 38–126)
Anion gap: 11 (ref 5–15)
BUN: 7 mg/dL (ref 6–20)
CO2: 21 mmol/L — ABNORMAL LOW (ref 22–32)
Calcium: 9.6 mg/dL (ref 8.9–10.3)
Chloride: 102 mmol/L (ref 101–111)
Creatinine, Ser: 0.55 mg/dL (ref 0.44–1.00)
GFR calc Af Amer: >60 mL/min (ref >60)
GFR calc non Af Amer: >60 mL/min (ref >60)
Glucose, Bld: 92 mg/dL (ref 65–99)
Potassium: 4.1 mmol/L (ref 3.5–5.1)
Sodium: 134 mmol/L — ABNORMAL LOW (ref 135–145)
Total Bilirubin: 0.7 mg/dL (ref 0.3–1.2)
Total Protein: 7.9 g/dL (ref 6.5–8.1)

## 2016-07-22 LAB — LIPASE, BLOOD: Lipase: 29 U/L (ref 11–51)

## 2016-07-22 LAB — URINALYSIS, ROUTINE W REFLEX MICROSCOPIC
Bilirubin Urine: NEGATIVE
Glucose, UA: NEGATIVE mg/dL
Hgb urine dipstick: NEGATIVE
Ketones, ur: NEGATIVE mg/dL
Leukocytes, UA: NEGATIVE
Nitrite: NEGATIVE
Protein, ur: NEGATIVE mg/dL
Specific Gravity, Urine: >1.03 — ABNORMAL HIGH (ref 1.005–1.030)
pH: 5 (ref 5.0–8.0)

## 2016-07-22 MED ORDER — ONDANSETRON HCL 4 MG/2ML IJ SOLN
4.0000 mg | Freq: Once | INTRAMUSCULAR | Status: AC
  Administered 2016-07-22: 4 mg via INTRAVENOUS
  Filled 2016-07-22: qty 2

## 2016-07-22 MED ORDER — DICYCLOMINE HCL 20 MG PO TABS
20.0000 mg | ORAL_TABLET | Freq: Once | ORAL | Status: DC
  Filled 2016-07-22: qty 1

## 2016-07-22 MED ORDER — ONDANSETRON 8 MG PO TBDP: 8.0000 mg | ORAL_TABLET | ORAL | Status: DC

## 2016-07-22 MED ORDER — RANITIDINE HCL 150 MG/10ML PO SYRP
300.0000 mg | ORAL_SOLUTION | Freq: Once | ORAL | Status: AC
Start: 2016-07-22 — End: 2016-07-22
  Administered 2016-07-22: 300 mg via ORAL
  Filled 2016-07-22: qty 20

## 2016-07-22 MED ORDER — LACTATED RINGERS IV BOLUS (SEPSIS)
1000.0000 mL | Freq: Once | INTRAVENOUS | Status: AC
  Administered 2016-07-22: 1000 mL via INTRAVENOUS

## 2016-07-22 MED ORDER — DICYCLOMINE HCL 10 MG PO CAPS
20.0000 mg | ORAL_CAPSULE | Freq: Once | ORAL | Status: AC
  Administered 2016-07-22: 20 mg via ORAL
  Filled 2016-07-22: qty 2

## 2016-07-22 MED ORDER — METOCLOPRAMIDE HCL 10 MG PO TABS
10.0000 mg | ORAL_TABLET | Freq: Once | ORAL | Status: AC
  Administered 2016-07-22: 10 mg via ORAL
  Filled 2016-07-22: qty 1

## 2016-07-22 MED ORDER — OXYCODONE-ACETAMINOPHEN 5-325 MG PO TABS
1.0000 | ORAL_TABLET | Freq: Once | ORAL | Status: AC
  Administered 2016-07-22: 1 via ORAL
  Filled 2016-07-22: qty 1

## 2016-07-22 MED ORDER — SUCRALFATE 1 GM/10ML PO SUSP
1.0000 g | Freq: Once | ORAL | Status: AC
  Administered 2016-07-22: 1 g via ORAL
  Filled 2016-07-22: qty 10

## 2016-07-22 MED ORDER — FAMOTIDINE IN NACL 20-0.9 MG/50ML-% IV SOLN
20.0000 mg | Freq: Once | INTRAVENOUS | Status: AC
  Administered 2016-07-22: 20 mg via INTRAVENOUS
  Filled 2016-07-22: qty 50

## 2016-07-22 MED ORDER — SODIUM CHLORIDE 0.9 % IV SOLN
Freq: Once | INTRAVENOUS | Status: AC
  Administered 2016-07-23: via INTRAVENOUS

## 2016-07-22 MED ORDER — PROMETHAZINE HCL 25 MG/ML IJ SOLN
25.0000 mg | Freq: Once | INTRAMUSCULAR | Status: AC
  Administered 2016-07-22: 25 mg via INTRAVENOUS
  Filled 2016-07-22: qty 1

## 2016-07-22 NOTE — ED Triage Notes (Signed)
Received report from [place], pt here for further evaluation and possible MRI.

## 2016-07-22 NOTE — MAU Note (Signed)
Became ill this morning while having breakfast. ([employer]: waffles, eggs, hashbrowns, grilled chicken breast).  Became diaphoretic. Continues to have sharp stomach pains, and emesis. No diarrhea. No one else is sick

## 2016-07-22 NOTE — MAU Provider Note (Signed)
Chief Complaint: Abdominal Pain and Emesis   First Provider Initiated Contact with Patient 07/22/16 1536      SUBJECTIVE HPI: Tracey Beard is a 35 y.o. G1P0 at [redacted]w[redacted]d by LMP who presents to maternity admissions reporting onset of severe n/v and abdominal cramping after eating breakfast this morning at [facility].  She reports hx of upper abdominal pain intermittently before pregnancy that resolved. She also reports n/v throughout the pregnancy but today was much worse than usual. She has not tried any treatments, nothing seems to make the pain better, and eating made it worse.  She denies diarrhea or constipation.  She denies vaginal bleeding, vaginal itching/burning, urinary symptoms, h/a, dizziness, n/v, or fever/chills.     HPI  Past Medical History:  Diagnosis Date  . Chronic back pain   . Clinical depression 12/05/2013  . Headache   . Idiopathic intracranial hypertension   . Leukocytosis 02/25/2015   Past Surgical History:  Procedure Laterality Date  . NO PAST SURGERIES     Social History   Social History  . Marital status: Married    Spouse name: N/A  . Number of children: N/A  . Years of education: N/A   Occupational History  . Not on file.   Social History Main Topics  . Smoking status: Never Smoker  . Smokeless tobacco: Never Used  . Alcohol use Yes     Comment: socially  . Drug use: No  . Sexual activity: No   Other Topics Concern  . Not on file   Social History Narrative  . No narrative on file   No current facility-administered medications on file prior to encounter.    Current Outpatient Prescriptions on File Prior to Encounter  Medication Sig Dispense Refill  . acetaminophen (TYLENOL) 500 MG tablet Take 1,000 mg by mouth every 4 (four) hours as needed for mild pain, moderate pain or headache.     . esomeprazole (NEXIUM) 20 MG capsule Take 20 mg by mouth daily as needed (for heartburn).     . Progesterone 50 MG SUPP Place 50 mg vaginally 2 (two)  times daily.     Allergies  Allergen Reactions  . Elavil [Amitriptyline Hcl] Shortness Of Breath  . Trazodone And Nefazodone Anaphylaxis  . Diamox [Acetazolamide] Other (See Comments)    Reaction:  Cold chills/loss of taste   . Maxalt [Rizatriptan Benzoate] Other (See Comments)    Reaction:  Chest tightness   . Benzocaine Swelling, Rash and Other (See Comments)    Reaction:  Localized swelling  . Caine-1 [Lidocaine Hcl] Swelling, Rash and Other (See Comments)    Reaction:  Localized swelling  . Latex Rash  . Novocain [Procaine] Swelling, Rash and Other (See Comments)    Reaction:  Localized swelling    ROS:  Review of Systems  Constitutional: Negative for chills, fatigue and fever.  Respiratory: Negative for shortness of breath.   Cardiovascular: Negative for chest pain.  Gastrointestinal: Positive for abdominal pain, nausea and vomiting. Negative for constipation and diarrhea.  Genitourinary: Negative for difficulty urinating, dysuria, flank pain, pelvic pain, vaginal bleeding, vaginal discharge and vaginal pain.  Neurological: Negative for dizziness and headaches.  Psychiatric/Behavioral: Negative.      I have reviewed patient's Past Medical Hx, Surgical Hx, Family Hx, Social Hx, medications and allergies.   Physical Exam   Patient Vitals for the past 24 hrs:  BP Temp Temp src Pulse Resp  07/22/16 1458 142/85 99 F (37.2 C) Oral 99 18  Constitutional: Well-developed, well-nourished female in no acute distress.  Cardiovascular: normal rate Respiratory: normal effort GI: Abd soft, non-tender. Pos BS x 4 MS: Extremities nontender, no edema, normal ROM Neurologic: Alert and oriented x 4.  GU: Neg CVAT.  PELVIC EXAM: Cervix pink, visually closed, without lesion, scant white creamy discharge, vaginal walls and external genitalia normal Bimanual exam: Cervix 0/long/high, firm, anterior, neg CMT, uterus nontender, nonenlarged, adnexa without tenderness, enlargement, or  mass  FHT 190 by doppler  LAB RESULTS Results for orders placed or performed during the hospital encounter of 07/22/16 (from the past 24 hour(s))  Urinalysis, Routine w reflex microscopic (not at [facility])     Status: Abnormal   Collection Time: 07/22/16  3:07 PM  Result Value Ref Range   Color, Urine YELLOW YELLOW   APPearance CLEAR CLEAR   Specific Gravity, Urine >1.030 (H) 1.005 - 1.030   pH 5.0 5.0 - 8.0   Glucose, UA NEGATIVE NEGATIVE mg/dL   Hgb urine dipstick NEGATIVE NEGATIVE   Bilirubin Urine NEGATIVE NEGATIVE   Ketones, ur NEGATIVE NEGATIVE mg/dL   Protein, ur NEGATIVE NEGATIVE mg/dL   Nitrite NEGATIVE NEGATIVE   Leukocytes, UA NEGATIVE NEGATIVE  CBC     Status: Abnormal   Collection Time: 07/22/16  4:13 PM  Result Value Ref Range   WBC 15.9 (H) 4.0 - 10.5 K/uL   RBC 4.08 3.87 - 5.11 MIL/uL   Hemoglobin 12.3 12.0 - 15.0 g/dL   HCT 35.4 (L) 36.0 - 46.0 %   MCV 86.8 78.0 - 100.0 fL   MCH 30.1 26.0 - 34.0 pg   MCHC 34.7 30.0 - 36.0 g/dL   RDW 14.8 11.5 - 15.5 %   Platelets 316 150 - 400 K/uL  Comprehensive metabolic panel     Status: Abnormal   Collection Time: 07/22/16  4:13 PM  Result Value Ref Range   Sodium 134 (L) 135 - 145 mmol/L   Potassium 4.1 3.5 - 5.1 mmol/L   Chloride 102 101 - 111 mmol/L   CO2 21 (L) 22 - 32 mmol/L   Glucose, Bld 92 65 - 99 mg/dL   BUN 7 6 - 20 mg/dL   Creatinine, Ser 0.55 0.44 - 1.00 mg/dL   Calcium 9.6 8.9 - 10.3 mg/dL   Total Protein 7.9 6.5 - 8.1 g/dL   Albumin 4.4 3.5 - 5.0 g/dL   AST 25 15 - 41 U/L   ALT 23 14 - 54 U/L   Alkaline Phosphatase 48 38 - 126 U/L   Total Bilirubin 0.7 0.3 - 1.2 mg/dL   GFR calc non Af Amer >60 >60 mL/min   GFR calc Af Amer >60 >60 mL/min   Anion gap 11 5 - 15  Lipase, blood     Status: None   Collection Time: 07/22/16  4:13 PM  Result Value Ref Range   Lipase 29 11 - 51 U/L    --/--/AB POS (09/22 2158)  IMAGING US Ob Transvaginal  Result Date: 07/07/2016 CLINICAL DATA:  Pelvic pain in  first-trimester pregnancy. EXAM: OBSTETRIC <14 WK TRANSVAGINAL OB US DOPPLER ULTRASOUND OF OVARIES TECHNIQUE: Transvaginal ultrasound examinations were performed for complete evaluation of the gestation as well as the maternal uterus, adnexal regions, and pelvic cul-de-sac. Transvaginal technique was performed to assess early pregnancy. Color and duplex Doppler ultrasound was utilized to evaluate blood flow to the ovaries. COMPARISON:  06/09/2016 FINDINGS: Intrauterine gestational sac: Single Yolk sac:  No longer visualized Embryo:  Visualized. Cardiac Activity: Visualized. Heart Rate:  193 bpm CRL:   22  mm   8 w 5 d                  [place] EDC: 02/11/2017 Subchorionic hemorrhage:  None visualized. Maternal uterus/adnexae: Hypoechoic mass in the anterior uterine body that is near the endometrium, also seen previously, 15 mm in maximal dimension. Symmetric normal ovarian size when accounting for corpus luteum on the left. Pulsed Doppler evaluation of both ovaries demonstrates normal appearing low-resistance arterial and venous waveforms. IMPRESSION: 1. Single living intrauterine pregnancy measuring 8 weeks 5 days. 2. Normal ovaries with corpus luteum on the left. 3. 15 mm intramural fibroid. Electronically Signed   By: Monte Fantasia M.D.   On: 07/07/2016 18:17   [place] Art/ven Flow Abd Pelv Doppler  Result Date: 07/07/2016 CLINICAL DATA:  Pelvic pain in first-trimester pregnancy. EXAM: OBSTETRIC <14 WK TRANSVAGINAL OB US DOPPLER ULTRASOUND OF OVARIES TECHNIQUE: Transvaginal ultrasound examinations were performed for complete evaluation of the gestation as well as the maternal uterus, adnexal regions, and pelvic cul-de-sac. Transvaginal technique was performed to assess early pregnancy. Color and duplex Doppler ultrasound was utilized to evaluate blood flow to the ovaries. COMPARISON:  06/09/2016 FINDINGS: Intrauterine gestational sac: Single Yolk sac:  No longer visualized Embryo:  Visualized. Cardiac Activity:  Visualized. Heart Rate: 193 bpm CRL:   22  mm   8 w 5 d                  [place] EDC: 02/11/2017 Subchorionic hemorrhage:  None visualized. Maternal uterus/adnexae: Hypoechoic mass in the anterior uterine body that is near the endometrium, also seen previously, 15 mm in maximal dimension. Symmetric normal ovarian size when accounting for corpus luteum on the left. Pulsed Doppler evaluation of both ovaries demonstrates normal appearing low-resistance arterial and venous waveforms. IMPRESSION: 1. Single living intrauterine pregnancy measuring 8 weeks 5 days. 2. Normal ovaries with corpus luteum on the left. 3. 15 mm intramural fibroid. Electronically Signed   By: Monte Fantasia M.D.   On: 07/07/2016 18:17    MAU Management/MDM: Ordered labs and reviewed results.  LR x 1000 ml, Phenergan 25 mg IV, Pepcid 20 mg IV, Bentyl 20 mg PO, and Zofran 4 mg IV given in MAU.  Pt reports nausea resolved but still reports upper abdominal pain.  Consult Dr Lynnette Caffey. Give Percocet x 1 PO.  RUQ [place] pending.    Report to Noni Saupe, NP  Patient reports no improvement in her pain.  N/V has subsided Discussed patient with Dr. Lynnette Caffey. Discussed Xray report. Unlikely an OB problem> patient without improvement. Will transfer to [facility] for further care/workup for upper abdominal pain.   Fatima Blank Certified Nurse-Midwife 07/22/2016  9:16 PM

## 2016-07-22 NOTE — ED Provider Notes (Signed)
[place] DEPT Provider Note   CSN: [id number] Arrival date & time: 07/22/16  1441  By signing my name below, I, Soijett Blue, attest that this documentation has been prepared under the direction and in the presence of Everlene Balls, MD. Electronically Signed: Soijett Blue, ED Scribe. 07/22/16. 11:40 PM.   History   Chief Complaint Chief Complaint  Patient presents with  . Abdominal Pain  . Emesis    HPI Tracey Beard is a 35 y.o. female with a PMHx of acid reflux, who presents to the Emergency Department complaining of sharp epigastric abdominal pain onset this morning. Pt notes that her epigastric abdominal pain began following eating [employer] this morning for breakfast. Pt states that she is currently pregnant in her first trimester and she was seen at [facility] this morning following the onset of her symptoms. Pt notes that she was sent to the ED for further evaluation of her symptoms due to a questionable [place]. Pt states that she was treated with bentyl, phenergan, pepcid, and zofran at [facility] for the relief of her symptoms. Denies sick contacts. Pt reports that she had similar symptoms in high school, and she also denies her current symptoms feeling similar to past acid reflux flares. She states that she is having associated symptoms of nausea and vomiting. She denies fever, diarrhea, hematuria, dysuria, and any other symptoms.   The history is provided by the patient and the spouse. No language interpreter was used.    Past Medical History:  Diagnosis Date  . Chronic back pain   . Clinical depression 12/05/2013  . Headache   . Idiopathic intracranial hypertension   . Leukocytosis 02/25/2015    Patient Active Problem List   Diagnosis Date Noted  . 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/2015  . 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 Pseudotumor cerebri 07/11/2014  . Obesity 07/11/2014  . Chronic back pain 07/11/2014  .  Anxiety state 12/05/2013  . Back ache 12/05/2013  . Clinical depression 12/05/2013  . 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 Family history of breast cancer 12/05/2013    Past Surgical History:  Procedure Laterality Date  . NO PAST SURGERIES      OB History    Gravida Para Term Preterm AB Living   1             SAB TAB Ectopic Multiple Live Births                   Home Medications    Prior to Admission medications   Medication Sig Start Date End Date Taking? Authorizing Provider  acetaminophen (TYLENOL) 500 MG tablet Take 1,000 mg by mouth every 4 (four) hours as needed for mild pain, moderate pain or headache.    Yes Historical Provider, MD  Doxylamine-Pyridoxine (DICLEGIS) 10-10 MG TBEC Take 2 tablets by mouth at bedtime.   Yes Historical Provider, MD  esomeprazole (NEXIUM) 20 MG capsule Take 20 mg by mouth daily as needed (for heartburn).    Yes Historical Provider, MD  omega-3 acid ethyl esters (LOVAZA) 1 g capsule Take 2 g by mouth 3 (three) times daily.   Yes Historical Provider, MD  Prenatal Vit-Fe Fumarate-FA (PRENATAL MULTIVITAMIN) TABS tablet Take 1 tablet by mouth daily.   Yes Historical Provider, MD  Progesterone 50 MG SUPP Place 50 mg vaginally 2 (two) times daily.   Yes Historical Provider, MD    Family History History reviewed. No pertinent family history.  Social  History Social History  Substance Use Topics  . Smoking status: Never Smoker  . Smokeless tobacco: Never Used  . Alcohol use Yes     Comment: socially     Allergies   Elavil [amitriptyline hcl]; Trazodone and nefazodone; Diamox [acetazolamide]; Maxalt [rizatriptan benzoate]; Benzocaine; Caine-1 [lidocaine hcl]; Latex; and Novocain [procaine]   Review of Systems Review of Systems A complete 10 system review of systems was obtained and all systems are negative except as noted in the HPI and PMH.   Physical Exam Updated Vital Signs BP 147/72 (BP Location: Right Arm)   Pulse 80   Temp  98.4 F (36.9 C) (Axillary)   Resp 16   LMP 05/03/2016 (Exact Date)   SpO2 100%   Physical Exam  Constitutional: She is oriented to person, place, and time. She appears well-developed and well-nourished. No distress.  HENT:  Head: Normocephalic and atraumatic.  Nose: Nose normal.  Mouth/Throat: Oropharynx is clear and moist. No oropharyngeal exudate.  Eyes: Conjunctivae and EOM are normal. Pupils are equal, round, and reactive to light. No scleral icterus.  Neck: Normal range of motion. Neck supple. No JVD present. No tracheal deviation present. No thyromegaly present.  Cardiovascular: Normal rate, regular rhythm and normal heart sounds.  Exam reveals no gallop and no friction rub.   No murmur heard. Pulmonary/Chest: Effort normal and breath sounds normal. No respiratory distress. She has no wheezes. She exhibits no tenderness.  Abdominal: Soft. Bowel sounds are normal. She exhibits no distension and no mass. There is tenderness in the epigastric area. There is no rebound and no guarding.  Mid epigastric tenderness to palpation.  Musculoskeletal: Normal range of motion. She exhibits no edema or tenderness.  Lymphadenopathy:    She has no cervical adenopathy.  Neurological: She is alert and oriented to person, place, and time. No cranial nerve deficit. She exhibits normal muscle tone.  Skin: Skin is warm and dry. No rash noted. No erythema. No pallor.  Nursing note and vitals reviewed.    ED Treatments / Results  DIAGNOSTIC STUDIES: Oxygen Saturation is 100% on RA, nl by my interpretation.    COORDINATION OF CARE: 11:37 PM Discussed treatment plan with pt at bedside which includes labs, UA, [place] limited RUQ, and pt agreed to plan.   Labs (all labs ordered are listed, but only abnormal results are displayed) Labs Reviewed  URINALYSIS, ROUTINE W REFLEX MICROSCOPIC (NOT AT [facility]) - Abnormal; Notable for the following:       Result Value   Specific Gravity, Urine >1.030 (*)    All  other components within normal limits  CBC - Abnormal; Notable for the following:    WBC 15.9 (*)    HCT 35.4 (*)    All other components within normal limits  COMPREHENSIVE METABOLIC PANEL - Abnormal; Notable for the following:    Sodium 134 (*)    CO2 21 (*)    All other components within normal limits  LIPASE, BLOOD    EKG  EKG Interpretation None       Radiology US Abdomen Limited Ruq  Result Date: 07/22/2016 CLINICAL DATA:  Right upper quadrant pain EXAM: US ABDOMEN LIMITED - RIGHT UPPER QUADRANT COMPARISON:  None. FINDINGS: Gallbladder: No gallstones or wall thickening visualized. No sonographic Murphy sign noted by sonographer. Common bile duct: Diameter: 1.8 mm Liver: Diffuse echogenicity of hepatic parenchyma, likely fatty infiltration. There is a focal 1.6 x 3.0 x 7.1 cm area of sparing from the fatty infiltration, adjacent to the  gallbladder. IMPRESSION: Echogenic liver parenchyma, likely fatty infiltration. Normal gallbladder and bile ducts. Electronically Signed   By: Andreas Newport M.D.   On: 07/22/2016 21:14    Procedures Procedures (including critical care time)  Medications Ordered in ED Medications  dicyclomine (BENTYL) tablet 20 mg (20 mg Oral Canceled Entry 07/22/16 1808)  ranitidine (ZANTAC) 150 MG/10ML syrup 300 mg (not administered)  sucralfate (CARAFATE) 1 GM/10ML suspension 1 g (not administered)  0.9 %  sodium chloride infusion (not administered)  lactated ringers bolus 1,000 mL (1,000 mLs Intravenous Given 07/22/16 1610)  promethazine (PHENERGAN) injection 25 mg (25 mg Intravenous Given 07/22/16 1610)  dicyclomine (BENTYL) capsule 20 mg (20 mg Oral Given 07/22/16 1808)  ondansetron (ZOFRAN) injection 4 mg (4 mg Intravenous Given 07/22/16 2005)  famotidine (PEPCID) IVPB 20 mg premix (20 mg Intravenous Given 07/22/16 2005)  metoCLOPramide (REGLAN) tablet 10 mg (10 mg Oral Given 07/22/16 2118)  oxyCODONE-acetaminophen (PERCOCET/ROXICET) 5-325 MG per tablet  1 tablet (1 tablet Oral Given 07/22/16 2118)     Initial Impression / Assessment and Plan / ED Course  I have reviewed the triage vital signs and the nursing notes.  Pertinent labs & imaging results that were available during my care of the patient were reviewed by me and considered in my medical decision making (see chart for details).  Clinical Course    Patient presents to the ED for midepigastric abdominal pain.  [facility] with [facility] of her nausea, but her pain persists.  [place] is neg for an acute process causing her pain.  Will treat further for gastritis with ranitidine and sucralfate.  Continue maintenance fluids.    12:56 AM Medication did not help the patients symptoms, though she is still requesting to go home.  Will provide GI follow up.  DC home with 6 tabs of tramadol to take as needed for pain control.  She appears well and in NAD. Vs remain within her normal limits and she is safe for Dc.  Final Clinical Impressions(s) / ED Diagnoses   Final diagnoses:  Nausea and vomiting during pregnancy  Acute fatty liver of pregnancy in first trimester    New Prescriptions New Prescriptions   No medications on file     I personally performed the services described in this documentation, which was scribed in my presence. The recorded information has been reviewed and is accurate.       Everlene Balls, MD 07/23/16 [phone]

## 2016-07-23 ENCOUNTER — Inpatient Hospital Stay (EMERGENCY_DEPARTMENT_HOSPITAL)
Admission: AD | Admit: 2016-07-23 | Discharge: 2016-07-23 | Disposition: A | Discharge status: Home / Self Care | Payer: Managed Care, Other (non HMO) | Source: Ambulatory Visit | Attending: Obstetrics & Gynecology | Admitting: Obstetrics & Gynecology

## 2016-07-23 ENCOUNTER — Encounter (HOSPITAL_COMMUNITY): Payer: Self-pay

## 2016-07-23 DIAGNOSIS — Z79899 Other long term (current) drug therapy: Secondary | ICD-10-CM

## 2016-07-23 DIAGNOSIS — O26891 Other specified pregnancy related conditions, first trimester: Secondary | ICD-10-CM | POA: Insufficient documentation

## 2016-07-23 DIAGNOSIS — R1013 Epigastric pain: Secondary | ICD-10-CM

## 2016-07-23 DIAGNOSIS — R1010 Upper abdominal pain, unspecified: Secondary | ICD-10-CM | POA: Insufficient documentation

## 2016-07-23 DIAGNOSIS — Z3A11 11 weeks gestation of pregnancy: Secondary | ICD-10-CM | POA: Insufficient documentation

## 2016-07-23 LAB — URINE MICROSCOPIC-ADD ON: RBC / HPF: NONE SEEN RBC/hpf (ref 0–5)

## 2016-07-23 LAB — URINALYSIS, ROUTINE W REFLEX MICROSCOPIC
Bilirubin Urine: NEGATIVE
Glucose, UA: NEGATIVE mg/dL
Hgb urine dipstick: NEGATIVE
Ketones, ur: NEGATIVE mg/dL
Nitrite: NEGATIVE
Protein, ur: NEGATIVE mg/dL
Specific Gravity, Urine: 1.02 (ref 1.005–1.030)
pH: 6 (ref 5.0–8.0)

## 2016-07-23 MED ORDER — METOCLOPRAMIDE HCL 5 MG/ML IJ SOLN
10.0000 mg | Freq: Once | INTRAMUSCULAR | Status: AC
  Administered 2016-07-23: 10 mg via INTRAVENOUS
  Filled 2016-07-23: qty 2

## 2016-07-23 MED ORDER — TRAMADOL HCL 50 MG PO TABS: 50.0000 mg | ORAL_TABLET | Freq: Two times a day (BID) | ORAL | 0 refills | Status: DC | PRN

## 2016-07-23 MED ORDER — METOCLOPRAMIDE HCL 10 MG PO TABS: 10.0000 mg | ORAL_TABLET | Freq: Three times a day (TID) | ORAL | 0 refills | Status: DC

## 2016-07-23 MED ORDER — METOCLOPRAMIDE HCL 5 MG/ML IJ SOLN: 10.0000 mg | Freq: Once | INTRAMUSCULAR | Status: DC

## 2016-07-23 MED ORDER — METOCLOPRAMIDE HCL 10 MG PO TABS
10.0000 mg | ORAL_TABLET | Freq: Once | ORAL | Status: AC
  Administered 2016-07-23: 10 mg via ORAL
  Filled 2016-07-23: qty 1

## 2016-07-23 MED ORDER — OXYCODONE-ACETAMINOPHEN 5-325 MG PO TABS
1.0000 | ORAL_TABLET | Freq: Once | ORAL | Status: AC
  Administered 2016-07-23: 1 via ORAL
  Filled 2016-07-23: qty 1

## 2016-07-23 MED ORDER — ONDANSETRON 4 MG PO TBDP: 4.0000 mg | ORAL_TABLET | Freq: Three times a day (TID) | ORAL | 0 refills | Status: DC | PRN

## 2016-07-23 NOTE — MAU Provider Note (Signed)
History     CSN: [id number]  Arrival date and time: 07/23/16 1058   First Provider Initiated Contact with Patient 07/23/16 1153      Chief Complaint  Patient presents with  . Abdominal Pain   HPI Tracey Beard 35 y.o. [redacted]w[redacted]d  Comes to MAU again today with the same upper abdominal pain that she had yesterday.  Reviewed the notes from MAU and [place] yesterday.  Seen in MAU - got fluids, meds, and had abdominal ultrasound.  Was sent to [facility] for further evaluation of a non-ob problem.  Had more meds at [place] but the pain did not entirely go away.  Was prescribed medications but client went home to rest.  She tried to eat a [place] sandwich at home today but it caused her stomach to hurt worse.  She called the office and was told to go back to [facility] for reevaluation, but client came to [facility] as she said the things they did at [facility] did not help.  She did get some relief when given Reglan and Percocent by mouth prior to her ultrasound.  Client has Nexium and Tylenol at home but did not take them today - tylenol does not help her.  Does not have Diclegis - even with her insurance was too expensive to purchase.  Did not take any Tums through the night or this morning but has taken Tums previously in this pregnancy.   Her discharge instructions say to call [facility] for an appointment and to be seen in the next 3 days.  Client reports she works every day but Wednesday and cannot take off to be seen.    OB History    Gravida Para Term Preterm AB Living   1             SAB TAB Ectopic Multiple Live Births                  Past Medical History:  Diagnosis Date  . Chronic back pain   . Clinical depression 12/05/2013  . Headache   . Idiopathic intracranial hypertension   . Leukocytosis 02/25/2015    Past Surgical History:  Procedure Laterality Date  . NO PAST SURGERIES      Family History  Problem Relation Age of Onset  . Cancer Mother   . Hypertension Mother   . Hypertension  Father   . Cancer Maternal Aunt   . Hypertension Maternal Aunt   . Cancer Maternal Uncle   . Hypertension Maternal Uncle   . Hypertension Maternal Grandmother   . Hypertension Maternal Grandfather   . Cancer Paternal Grandmother     Social History  Substance Use Topics  . Smoking status: Never Smoker  . Smokeless tobacco: Never Used  . Alcohol use Yes     Comment: socially    Allergies:  Allergies  Allergen Reactions  . Elavil [Amitriptyline Hcl] Shortness Of Breath  . Trazodone And Nefazodone Anaphylaxis  . Diamox [Acetazolamide] Other (See Comments)    Reaction:  Cold chills/loss of taste   . Maxalt [Rizatriptan Benzoate] Other (See Comments)    Reaction:  Chest tightness   . Benzocaine Swelling, Rash and Other (See Comments)    Reaction:  Localized swelling  . Caine-1 [Lidocaine Hcl] Swelling, Rash and Other (See Comments)    Reaction:  Localized swelling  . Latex Rash  . Novocain [Procaine] Swelling, Rash and Other (See Comments)    Reaction:  Localized swelling    Prescriptions Prior  to Admission  Medication Sig Dispense Refill Last Dose  . acetaminophen (TYLENOL) 500 MG tablet Take 1,000 mg by mouth every 4 (four) hours as needed for mild pain, moderate pain or headache.    Past Month at Unknown time  . esomeprazole (NEXIUM) 20 MG capsule Take 20 mg by mouth daily as needed (for heartburn).    Past Week at Unknown time  . omega-3 acid ethyl esters (LOVAZA) 1 g capsule Take 2 g by mouth 3 (three) times daily.   07/22/2016 at Unknown time  . ondansetron (ZOFRAN ODT) 4 MG disintegrating tablet Take 1 tablet (4 mg total) by mouth every 8 (eight) hours as needed for nausea or vomiting. 12 tablet 0 07/22/2016 at Unknown time  . Prenatal Vit-Fe Fumarate-FA (PRENATAL MULTIVITAMIN) TABS tablet Take 1 tablet by mouth daily.   07/22/2016 at Unknown time  . Progesterone 50 MG SUPP Place 50 mg vaginally 2 (two) times daily.   07/22/2016 at Unknown time  . traMADol (ULTRAM) 50 MG  tablet Take 50 mg by mouth every 12 (twelve) hours as needed for moderate pain.   Past Month at Unknown time    Review of Systems  Constitutional: Negative for fever.  Gastrointestinal: Positive for abdominal pain and nausea. Negative for vomiting.  Genitourinary:       No vaginal discharge. No vaginal bleeding. No dysuria.   Physical Exam   Blood pressure 135/93, pulse 89, temperature 98.9 F (37.2 C), temperature source Oral, resp. rate 18, height 5\' 1"  (1.549 m), weight 220 lb (99.8 kg), last menstrual period 05/03/2016, SpO2 100 %.  Physical Exam  Nursing note and vitals reviewed. Constitutional: She is oriented to person, place, and time. She appears well-developed and well-nourished.  HENT:  Head: Normocephalic.  Eyes: EOM are normal.  Neck: Neck supple.  GI: Soft. Bowel sounds are normal. There is no tenderness.  Pain is just below xiphoid process.  Denies any pain/heartburn below the sternum today  Musculoskeletal: Normal range of motion.  Neurological: She is alert and oriented to person, place, and time.  Skin: Skin is warm and dry.  Psychiatric: She has a normal mood and affect.    MAU Course  Procedures Results for orders placed or performed during the hospital encounter of 07/23/16 (from the past 24 hour(s))  Urinalysis, Routine w reflex microscopic (not at [facility])     Status: Abnormal   Collection Time: 07/23/16 11:05 AM  Result Value Ref Range   Color, Urine YELLOW YELLOW   APPearance CLEAR CLEAR   Specific Gravity, Urine 1.020 1.005 - 1.030   pH 6.0 5.0 - 8.0   Glucose, UA NEGATIVE NEGATIVE mg/dL   Hgb urine dipstick NEGATIVE NEGATIVE   Bilirubin Urine NEGATIVE NEGATIVE   Ketones, ur NEGATIVE NEGATIVE mg/dL   Protein, ur NEGATIVE NEGATIVE mg/dL   Nitrite NEGATIVE NEGATIVE   Leukocytes, UA TRACE (A) NEGATIVE  Urine microscopic-add on     Status: Abnormal   Collection Time: 07/23/16 11:05 AM  Result Value Ref Range   Squamous Epithelial / LPF 6-30 (A)  NONE SEEN   WBC, UA 6-30 0 - 5 WBC/hpf   RBC / HPF NONE SEEN 0 - 5 RBC/hpf   Bacteria, UA MANY (A) NONE SEEN   Urine-Other MUCOUS PRESENT     MDM 1230  Consult with Dr. Lynnette Caffey by phone and reviewed the plan of care.  Will give Reglan 10 mg PO and one Percocet tablet as these seemed to help with the pain yesterday.  Then she will go by her pharmacy to pick up medications that have been prescribed for her.  Discussed the importance of taking the medications that are available to her.  Client is very frustrated about the pain she is having and wants it to be resolved.  Wants to have MRI done but at this point given that the CMP is relatively normal, the MRI does not need to be done on an emergency basis.  Client needs to be seen by gastroenterology to be evaluated and then MRI if needed for their evaluation.  Assessment and Plan  Unresolved Epigastric Pain likely gastritis  Plan Pick up your medications today from your pharmacy and take as ordered. Can take zofran, Nexium and Tums to help decrease the acid in your stomach. Also take a stool softener as zofran can cause constipation. Follow a no or low fat diet. Can use Tylenol and/or ultram for pain control if needed. Call [facility] tomorrow and report being seen in the ER and schedule an appointment. Call your doctor if your symptoms are worsening.  Deniqua Perry L Shaunita Seney 07/23/2016, 12:22 PM

## 2016-07-23 NOTE — MAU Note (Signed)
Pt was here yesterday for upper abdominal pain. Pt states she was told she had a fatty liver. Pt was transferred to [facility] and was told her pain was gas and sent home. Pt states the pain has continued and the medication she was given did not relieve the pain.

## 2016-07-23 NOTE — Discharge Instructions (Signed)
Pick up your medications today from your pharmacy and take as ordered. Can take zofran, Nexium and Tums to help decrease the acid in your stomach.  Try Reglan if these are not working. Also take a stool softener as zofran can cause constipation. Follow a no or low fat diet. Can use Tylenol and/or ultram for pain control if needed. Call [facility] tomorrow and report being seen in the ER and schedule an appointment. Call your doctor if your symptoms are worsening.   Avoid foods and drinks that make your symptoms worse, such as: ? Caffeine or alcoholic drinks. ? Chocolate. ? Peppermint or mint flavorings. ? Garlic and onions. ? Spicy foods. ? Citrus fruits, such as oranges, lemons, or limes. ? Tomato-based foods such as sauce, chili, salsa, and pizza. ? Fried and fatty foods.  Eat small, frequent meals instead of large meals.  WHAT FOODS CAN I EAT? Grains Whole grains, such as whole wheat or whole grain breads, crackers, cereals, and pasta. Unsweetened oatmeal, bulgur, barley, quinoa, or brown rice. Corn or whole wheat flour tortillas. Vegetables Fresh or frozen vegetables (raw, steamed, roasted, or grilled). Green salads. Fruits All fresh, canned (in natural juice), or frozen fruits. Meat and Other Protein Products Ground beef (85% or leaner), grass-fed beef, or beef trimmed of fat. Skinless chicken or [place]. Ground chicken or [place]. Pork trimmed of fat. All fish and seafood. Eggs. Dried beans, peas, or lentils. Unsalted nuts or seeds. Unsalted canned or dry beans. Dairy Low-fat dairy products, such as skim or 1% milk, 2% or reduced-fat cheeses, low-fat ricotta or cottage cheese, or plain low-fat yogurt. Fats and Oils Tub margarines without trans fats. Light or reduced-fat mayonnaise and salad dressings. Avocado. Olive, canola, sesame, or safflower oils. Natural peanut or almond butter (choose ones without added sugar and oil). The items listed above may not be a  complete list of recommended foods or beverages. Contact your dietitian for more options. WHAT FOODS ARE NOT RECOMMENDED? Grains White bread. White pasta. White rice. Cornbread. Bagels, pastries, and croissants. Crackers that contain trans fat. Vegetables White potatoes. Corn. Creamed or fried vegetables. Vegetables in a cheese sauce. Fruits Dried fruits. Canned fruit in light or heavy syrup. Fruit juice. Meat and Other Protein Products Fatty cuts of meat. Ribs, chicken wings, bacon, sausage, bologna, salami, chitterlings, fatback, hot dogs, bratwurst, and packaged luncheon meats. Liver and organ meats. Dairy Whole or 2% milk, cream, half-and-half, and cream cheese. Whole milk cheeses. Whole-fat or sweetened yogurt. Full-fat cheeses. Nondairy creamers and whipped toppings. Processed cheese, cheese spreads, or cheese curds. Sweets and Desserts Corn syrup, sugars, honey, and molasses. Candy. Jam and jelly. Syrup. Sweetened cereals. Cookies, pies, cakes, donuts, muffins, and ice cream. Fats and Oils Butter, stick margarine, lard, shortening, ghee, or bacon fat. Coconut, palm kernel, or palm oils. Beverages Alcohol. Sweetened drinks (such as sodas, lemonade, and fruit drinks or punches).

## 2016-09-08 ENCOUNTER — Other Ambulatory Visit (HOSPITAL_COMMUNITY): Payer: Self-pay | Admitting: Obstetrics and Gynecology

## 2016-09-08 DIAGNOSIS — Z3689 Encounter for other specified antenatal screening: Secondary | ICD-10-CM

## 2016-09-08 DIAGNOSIS — Z3A16 16 weeks gestation of pregnancy: Secondary | ICD-10-CM

## 2016-09-12 ENCOUNTER — Other Ambulatory Visit (HOSPITAL_COMMUNITY): Payer: Self-pay | Admitting: *Deleted

## 2016-09-12 ENCOUNTER — Ambulatory Visit (HOSPITAL_COMMUNITY)
Admission: RE | Admit: 2016-09-12 | Discharge: 2016-09-12 | Disposition: A | Discharge status: Home / Self Care | Payer: Managed Care, Other (non HMO) | Source: Ambulatory Visit | Attending: Obstetrics and Gynecology | Admitting: Obstetrics and Gynecology

## 2016-09-12 ENCOUNTER — Encounter (HOSPITAL_COMMUNITY): Payer: Self-pay

## 2016-09-12 DIAGNOSIS — O26842 Uterine size-date discrepancy, second trimester: Secondary | ICD-10-CM | POA: Insufficient documentation

## 2016-09-12 DIAGNOSIS — O283 Abnormal ultrasonic finding on antenatal screening of mother: Secondary | ICD-10-CM | POA: Insufficient documentation

## 2016-09-12 DIAGNOSIS — Z3A18 18 weeks gestation of pregnancy: Secondary | ICD-10-CM | POA: Diagnosis not present

## 2016-09-12 DIAGNOSIS — Z363 Encounter for antenatal screening for malformations: Secondary | ICD-10-CM | POA: Diagnosis not present

## 2016-09-12 DIAGNOSIS — O365920 Maternal care for other known or suspected poor fetal growth, second trimester, not applicable or unspecified: Secondary | ICD-10-CM

## 2016-09-12 DIAGNOSIS — O99212 Obesity complicating pregnancy, second trimester: Secondary | ICD-10-CM | POA: Diagnosis not present

## 2016-09-12 DIAGNOSIS — Z3A16 16 weeks gestation of pregnancy: Secondary | ICD-10-CM

## 2016-09-12 DIAGNOSIS — Z3689 Encounter for other specified antenatal screening: Secondary | ICD-10-CM

## 2016-09-22 ENCOUNTER — Other Ambulatory Visit (HOSPITAL_COMMUNITY): Payer: Self-pay

## 2016-09-22 ENCOUNTER — Encounter (HOSPITAL_COMMUNITY): Payer: Self-pay

## 2016-10-04 ENCOUNTER — Ambulatory Visit (HOSPITAL_COMMUNITY)
Admission: RE | Admit: 2016-10-04 | Discharge: 2016-10-04 | Disposition: A | Discharge status: Home / Self Care | Payer: Managed Care, Other (non HMO) | Source: Ambulatory Visit | Attending: Obstetrics and Gynecology | Admitting: Obstetrics and Gynecology

## 2016-10-04 ENCOUNTER — Other Ambulatory Visit (HOSPITAL_COMMUNITY): Payer: Self-pay | Admitting: Maternal and Fetal Medicine

## 2016-10-04 ENCOUNTER — Encounter (HOSPITAL_COMMUNITY): Payer: Self-pay

## 2016-10-04 DIAGNOSIS — Z3A22 22 weeks gestation of pregnancy: Secondary | ICD-10-CM

## 2016-10-04 DIAGNOSIS — O99212 Obesity complicating pregnancy, second trimester: Secondary | ICD-10-CM

## 2016-10-04 DIAGNOSIS — O365920 Maternal care for other known or suspected poor fetal growth, second trimester, not applicable or unspecified: Secondary | ICD-10-CM | POA: Insufficient documentation

## 2016-10-05 ENCOUNTER — Other Ambulatory Visit (HOSPITAL_COMMUNITY): Payer: Self-pay | Admitting: *Deleted

## 2016-10-05 DIAGNOSIS — O365990 Maternal care for other known or suspected poor fetal growth, unspecified trimester, not applicable or unspecified: Secondary | ICD-10-CM

## 2016-10-18 ENCOUNTER — Encounter (HOSPITAL_COMMUNITY): Payer: Self-pay

## 2016-10-18 ENCOUNTER — Ambulatory Visit (HOSPITAL_COMMUNITY)
Admission: RE | Admit: 2016-10-18 | Discharge: 2016-10-18 | Disposition: A | Discharge status: Home / Self Care | Payer: Managed Care, Other (non HMO) | Source: Ambulatory Visit | Attending: Obstetrics and Gynecology | Admitting: Obstetrics and Gynecology

## 2016-10-18 ENCOUNTER — Other Ambulatory Visit (HOSPITAL_COMMUNITY): Payer: Self-pay | Admitting: Maternal and Fetal Medicine

## 2016-10-18 DIAGNOSIS — Z3A24 24 weeks gestation of pregnancy: Secondary | ICD-10-CM | POA: Diagnosis not present

## 2016-10-18 DIAGNOSIS — O4102X0 Oligohydramnios, second trimester, not applicable or unspecified: Secondary | ICD-10-CM | POA: Insufficient documentation

## 2016-10-18 DIAGNOSIS — O365990 Maternal care for other known or suspected poor fetal growth, unspecified trimester, not applicable or unspecified: Secondary | ICD-10-CM

## 2016-10-18 DIAGNOSIS — O365920 Maternal care for other known or suspected poor fetal growth, second trimester, not applicable or unspecified: Secondary | ICD-10-CM | POA: Insufficient documentation

## 2016-10-18 DIAGNOSIS — O10012 Pre-existing essential hypertension complicating pregnancy, second trimester: Secondary | ICD-10-CM | POA: Insufficient documentation

## 2016-10-18 HISTORY — DX: Essential (primary) hypertension: I10

## 2016-10-18 MED ORDER — BETAMETHASONE SOD PHOS & ACET 6 (3-3) MG/ML IJ SUSP
12.0000 mg | Freq: Once | INTRAMUSCULAR | Status: AC
  Administered 2016-10-18: 12 mg via INTRAMUSCULAR
  Filled 2016-10-18: qty 2

## 2016-10-18 NOTE — Consult Note (Signed)
Neonatology Consult to Antenatal Patient:  I was asked by Dr. Burnett Harry to see this patient in order to provide antenatal counseling due to severe IUGR infant at 24 weeks in setting of chronic maternal HTN.  Tracey Beard is being seen by Dr. Burnett Harry today and is a patient of Dr. Helane Rima. GA is 24 0/7 weeks today. [name] has chronic HTN and had an ultrasound on 1/18, which showed severe IUGR, with an EFW of 211 grams. Today, ultrasound shows mild oligohydramnios, absent EDF, and the EFW is 258 grams. The baby is female. Dr. Burnett Harry has spoken with the patient and her husband and has told them that the baby will probably not survive if left in utero and will also probably not survive if delivered in the near future.  I spoke with the patient and her husband. We discussed the limitations that exist due to this infant's anticipated size; specifically, that the laryngoscope and endotracheal tubes available would be too large for this infant, so that we would have no effective means of providing necessary respiratory support. I let them know that, even with normally grown 24 week infants, it would be very unusual for a baby born at this GA to be managed without mechanical ventilation, and even our CPAP apparatus would be too large for an infant of this size. In the event that the baby turned out to be larger than thought by ultrasound, our chances for a successful resuscitation would be improved, and we would do everything possible to resuscitate and care for their baby if she is approximately 400 grams or greater. I informed them that poor lung development and pulmonary hypertension are often seen in severely SGA preterm infants, which complicates their care and survival. I answered their questions, and would be glad to come back if they think of any further questions.  Please let our team know if you anticipate delivery of this infant and if the parents want [place] to attend, with the understanding that we may not be able  to attempt resuscitation if the baby is less than about 400 grams.  Thank you for asking me to see this patient.  Real Cons, MD Neonatologist  The total length of face-to-face or floor/unit time for this encounter was 30 minutes. Counseling and/or coordination of care was 20 minutes of the above.

## 2016-10-18 NOTE — ED Notes (Signed)
Neonatologist in with pt for consult.

## 2016-10-19 ENCOUNTER — Ambulatory Visit (HOSPITAL_COMMUNITY)
Admission: RE | Admit: 2016-10-19 | Discharge: 2016-10-19 | Disposition: A | Discharge status: Home / Self Care | Payer: Managed Care, Other (non HMO) | Source: Ambulatory Visit | Attending: Obstetrics and Gynecology | Admitting: Obstetrics and Gynecology

## 2016-10-19 DIAGNOSIS — O365920 Maternal care for other known or suspected poor fetal growth, second trimester, not applicable or unspecified: Secondary | ICD-10-CM | POA: Insufficient documentation

## 2016-10-19 DIAGNOSIS — Z3A24 24 weeks gestation of pregnancy: Secondary | ICD-10-CM | POA: Diagnosis not present

## 2016-10-19 MED ORDER — BETAMETHASONE SOD PHOS & ACET 6 (3-3) MG/ML IJ SUSP
12.0000 mg | Freq: Once | INTRAMUSCULAR | Status: DC
  Filled 2016-10-19: qty 2

## 2016-10-19 NOTE — ED Notes (Signed)
Pt here today for second dose of BMZ.  Pt tolerated well.

## 2016-10-20 ENCOUNTER — Encounter (HOSPITAL_COMMUNITY): Payer: Self-pay

## 2016-10-20 ENCOUNTER — Other Ambulatory Visit (HOSPITAL_COMMUNITY): Payer: Self-pay | Admitting: Maternal and Fetal Medicine

## 2016-10-20 ENCOUNTER — Ambulatory Visit (HOSPITAL_COMMUNITY): Payer: Managed Care, Other (non HMO)

## 2016-10-20 ENCOUNTER — Ambulatory Visit (HOSPITAL_COMMUNITY)
Admission: RE | Admit: 2016-10-20 | Discharge: 2016-10-20 | Disposition: A | Discharge status: Home / Self Care | Payer: Managed Care, Other (non HMO) | Source: Ambulatory Visit | Attending: Obstetrics and Gynecology | Admitting: Obstetrics and Gynecology

## 2016-10-20 DIAGNOSIS — O4100X0 Oligohydramnios, unspecified trimester, not applicable or unspecified: Secondary | ICD-10-CM | POA: Insufficient documentation

## 2016-10-20 DIAGNOSIS — O365990 Maternal care for other known or suspected poor fetal growth, unspecified trimester, not applicable or unspecified: Secondary | ICD-10-CM | POA: Insufficient documentation

## 2016-10-20 DIAGNOSIS — Z3A24 24 weeks gestation of pregnancy: Secondary | ICD-10-CM | POA: Diagnosis not present

## 2016-10-24 ENCOUNTER — Other Ambulatory Visit (HOSPITAL_COMMUNITY): Payer: Self-pay | Admitting: Maternal and Fetal Medicine

## 2016-10-24 ENCOUNTER — Encounter (HOSPITAL_COMMUNITY): Payer: Self-pay

## 2016-10-24 ENCOUNTER — Ambulatory Visit (HOSPITAL_COMMUNITY)
Admission: RE | Admit: 2016-10-24 | Discharge: 2016-10-24 | Disposition: A | Discharge status: Home / Self Care | Payer: Managed Care, Other (non HMO) | Source: Ambulatory Visit | Attending: Obstetrics and Gynecology | Admitting: Obstetrics and Gynecology

## 2016-10-24 DIAGNOSIS — O365990 Maternal care for other known or suspected poor fetal growth, unspecified trimester, not applicable or unspecified: Secondary | ICD-10-CM

## 2016-10-24 DIAGNOSIS — O10012 Pre-existing essential hypertension complicating pregnancy, second trimester: Secondary | ICD-10-CM | POA: Diagnosis not present

## 2016-10-24 DIAGNOSIS — O99212 Obesity complicating pregnancy, second trimester: Secondary | ICD-10-CM | POA: Insufficient documentation

## 2016-10-24 DIAGNOSIS — Z3A24 24 weeks gestation of pregnancy: Secondary | ICD-10-CM | POA: Insufficient documentation

## 2016-10-24 DIAGNOSIS — O365920 Maternal care for other known or suspected poor fetal growth, second trimester, not applicable or unspecified: Secondary | ICD-10-CM | POA: Diagnosis not present

## 2016-10-27 ENCOUNTER — Encounter (HOSPITAL_COMMUNITY): Payer: Self-pay

## 2016-10-27 ENCOUNTER — Ambulatory Visit (HOSPITAL_COMMUNITY)
Admission: RE | Admit: 2016-10-27 | Discharge: 2016-10-27 | Disposition: A | Discharge status: Home / Self Care | Payer: Managed Care, Other (non HMO) | Source: Ambulatory Visit | Attending: Obstetrics and Gynecology | Admitting: Obstetrics and Gynecology

## 2016-10-27 ENCOUNTER — Other Ambulatory Visit (HOSPITAL_COMMUNITY): Payer: Self-pay | Admitting: Maternal and Fetal Medicine

## 2016-10-27 DIAGNOSIS — O2692 Pregnancy related conditions, unspecified, second trimester: Secondary | ICD-10-CM

## 2016-10-27 DIAGNOSIS — Z3A25 25 weeks gestation of pregnancy: Secondary | ICD-10-CM

## 2016-10-27 DIAGNOSIS — O162 Unspecified maternal hypertension, second trimester: Secondary | ICD-10-CM

## 2016-10-27 DIAGNOSIS — O365920 Maternal care for other known or suspected poor fetal growth, second trimester, not applicable or unspecified: Secondary | ICD-10-CM | POA: Diagnosis not present

## 2016-10-27 DIAGNOSIS — O99212 Obesity complicating pregnancy, second trimester: Secondary | ICD-10-CM

## 2016-10-27 DIAGNOSIS — O10012 Pre-existing essential hypertension complicating pregnancy, second trimester: Secondary | ICD-10-CM | POA: Diagnosis not present

## 2016-10-27 DIAGNOSIS — O365990 Maternal care for other known or suspected poor fetal growth, unspecified trimester, not applicable or unspecified: Secondary | ICD-10-CM

## 2016-10-31 ENCOUNTER — Other Ambulatory Visit (HOSPITAL_COMMUNITY): Payer: Self-pay | Admitting: *Deleted

## 2016-10-31 ENCOUNTER — Ambulatory Visit (HOSPITAL_COMMUNITY)
Admission: RE | Admit: 2016-10-31 | Discharge: 2016-10-31 | Disposition: A | Discharge status: Home / Self Care | Payer: Managed Care, Other (non HMO) | Source: Ambulatory Visit | Attending: Obstetrics and Gynecology | Admitting: Obstetrics and Gynecology

## 2016-10-31 ENCOUNTER — Other Ambulatory Visit (HOSPITAL_COMMUNITY): Payer: Self-pay | Admitting: Obstetrics and Gynecology

## 2016-10-31 ENCOUNTER — Encounter (HOSPITAL_COMMUNITY): Payer: Self-pay

## 2016-10-31 DIAGNOSIS — O99212 Obesity complicating pregnancy, second trimester: Secondary | ICD-10-CM | POA: Insufficient documentation

## 2016-10-31 DIAGNOSIS — O4102X0 Oligohydramnios, second trimester, not applicable or unspecified: Secondary | ICD-10-CM

## 2016-10-31 DIAGNOSIS — O365920 Maternal care for other known or suspected poor fetal growth, second trimester, not applicable or unspecified: Secondary | ICD-10-CM

## 2016-10-31 DIAGNOSIS — O365990 Maternal care for other known or suspected poor fetal growth, unspecified trimester, not applicable or unspecified: Secondary | ICD-10-CM

## 2016-10-31 DIAGNOSIS — O10012 Pre-existing essential hypertension complicating pregnancy, second trimester: Secondary | ICD-10-CM | POA: Insufficient documentation

## 2016-10-31 DIAGNOSIS — O368120 Decreased fetal movements, second trimester, not applicable or unspecified: Secondary | ICD-10-CM

## 2016-10-31 DIAGNOSIS — Z3A25 25 weeks gestation of pregnancy: Secondary | ICD-10-CM | POA: Insufficient documentation

## 2016-10-31 NOTE — Progress Notes (Signed)
No fetal movement felt since 10-30-16 about 2300 per pt.

## 2016-10-31 NOTE — ED Notes (Addendum)
Pt here today with spouse in [place].  Very tearful and upset upon checking into registration.  Pt brought directly back.  Pt states she feels like everyone has given up hope on her baby.  She just got a call prior to arrival from [facility]. After faxing records for second opinion of baby.  Pt was told they could not accept her or do anything for her/baby.  FOB appears to be very supportive trying to help calm pt.  Pt place in separate room from other pt's and emotional support provided by M. Thamas Jaegers, Therapist, sports.  Chaplain called per pt request.  Dr. Burnett Harry aware of pt status.  [place] with  to pt's/FOB bedside in ultrasound room.

## 2016-11-01 ENCOUNTER — Encounter (HOSPITAL_COMMUNITY): Payer: Self-pay

## 2016-11-01 ENCOUNTER — Inpatient Hospital Stay (HOSPITAL_COMMUNITY)
Admission: AD | Admit: 2016-11-01 | Discharge: 2016-11-03 | DRG: 775 | Disposition: A | Discharge status: Home / Self Care | Payer: Managed Care, Other (non HMO) | Source: Ambulatory Visit | Attending: Obstetrics and Gynecology | Admitting: Obstetrics and Gynecology

## 2016-11-01 DIAGNOSIS — O365930 Maternal care for other known or suspected poor fetal growth, third trimester, not applicable or unspecified: Secondary | ICD-10-CM | POA: Diagnosis present

## 2016-11-01 DIAGNOSIS — G8929 Other chronic pain: Secondary | ICD-10-CM | POA: Diagnosis present

## 2016-11-01 DIAGNOSIS — Z3A26 26 weeks gestation of pregnancy: Secondary | ICD-10-CM | POA: Diagnosis not present

## 2016-11-01 DIAGNOSIS — O134 Gestational [pregnancy-induced] hypertension without significant proteinuria, complicating childbirth: Secondary | ICD-10-CM | POA: Diagnosis present

## 2016-11-01 DIAGNOSIS — G932 Benign intracranial hypertension: Secondary | ICD-10-CM | POA: Diagnosis present

## 2016-11-01 DIAGNOSIS — O99324 Drug use complicating childbirth: Secondary | ICD-10-CM | POA: Diagnosis present

## 2016-11-01 DIAGNOSIS — F1120 Opioid dependence, uncomplicated: Secondary | ICD-10-CM | POA: Diagnosis present

## 2016-11-01 DIAGNOSIS — M549 Dorsalgia, unspecified: Secondary | ICD-10-CM | POA: Diagnosis present

## 2016-11-01 DIAGNOSIS — O364XX0 Maternal care for intrauterine death, not applicable or unspecified: Secondary | ICD-10-CM | POA: Diagnosis present

## 2016-11-01 DIAGNOSIS — O99354 Diseases of the nervous system complicating childbirth: Secondary | ICD-10-CM | POA: Diagnosis present

## 2016-11-01 LAB — COMPREHENSIVE METABOLIC PANEL
ALT: 25 U/L (ref 14–54)
AST: 32 U/L (ref 15–41)
Albumin: 3.6 g/dL (ref 3.5–5.0)
Alkaline Phosphatase: 73 U/L (ref 38–126)
Anion gap: 11 (ref 5–15)
BUN: 6 mg/dL (ref 6–20)
CO2: 21 mmol/L — ABNORMAL LOW (ref 22–32)
Calcium: 9.2 mg/dL (ref 8.9–10.3)
Chloride: 100 mmol/L — ABNORMAL LOW (ref 101–111)
Creatinine, Ser: 0.52 mg/dL (ref 0.44–1.00)
GFR calc Af Amer: >60 mL/min (ref >60)
GFR calc non Af Amer: >60 mL/min (ref >60)
Glucose, Bld: 95 mg/dL (ref 65–99)
Potassium: 3.9 mmol/L (ref 3.5–5.1)
Sodium: 132 mmol/L — ABNORMAL LOW (ref 135–145)
Total Bilirubin: 0.6 mg/dL (ref 0.3–1.2)
Total Protein: 7.4 g/dL (ref 6.5–8.1)

## 2016-11-01 LAB — CBC
HCT: 39.2 % (ref 36.0–46.0)
Hemoglobin: 14 g/dL (ref 12.0–15.0)
MCH: 30.7 pg (ref 26.0–34.0)
MCHC: 35.7 g/dL (ref 30.0–36.0)
MCV: 86 fL (ref 78.0–100.0)
Platelets: 325 10*3/uL (ref 150–400)
RBC: 4.56 MIL/uL (ref 3.87–5.11)
RDW: 13.7 % (ref 11.5–15.5)
WBC: 16.8 10*3/uL — ABNORMAL HIGH (ref 4.0–10.5)

## 2016-11-01 LAB — SAVE SMEAR

## 2016-11-01 LAB — APTT: aPTT: 29 seconds (ref 24–36)

## 2016-11-01 LAB — PROTIME-INR
INR: 0.98
Prothrombin Time: 13 seconds (ref 11.4–15.2)

## 2016-11-01 LAB — FIBRINOGEN: Fibrinogen: 537 mg/dL — ABNORMAL HIGH (ref 210–475)

## 2016-11-01 LAB — TYPE AND SCREEN
ABO/RH(D): AB POS
Antibody Screen: NEGATIVE

## 2016-11-01 LAB — PLATELET COUNT: Platelets: 304 10*3/uL (ref 150–400)

## 2016-11-01 MED ORDER — LABETALOL HCL 200 MG PO TABS
200.0000 mg | ORAL_TABLET | Freq: Three times a day (TID) | ORAL | Status: DC
  Administered 2016-11-01 – 2016-11-03 (×6): 200 mg via ORAL
  Filled 2016-11-01: qty 2
  Filled 2016-11-01: qty 1
  Filled 2016-11-01: qty 2
  Filled 2016-11-01 (×3): qty 1

## 2016-11-01 MED ORDER — LACTATED RINGERS IV SOLN
INTRAVENOUS | Status: DC
Start: 2016-11-01 — End: 2016-11-02
  Administered 2016-11-01 (×2): via INTRAVENOUS

## 2016-11-01 MED ORDER — MISOPROSTOL 200 MCG PO TABS
200.0000 ug | ORAL_TABLET | Freq: Three times a day (TID) | ORAL | Status: AC
  Administered 2016-11-01 (×2): 200 ug via VAGINAL
  Filled 2016-11-01 (×2): qty 1

## 2016-11-01 MED ORDER — ACETAMINOPHEN 325 MG PO TABS: 650.0000 mg | ORAL_TABLET | ORAL | Status: DC | PRN

## 2016-11-01 MED ORDER — OXYCODONE HCL 5 MG PO TABS
20.0000 mg | ORAL_TABLET | Freq: Two times a day (BID) | ORAL | Status: DC
  Administered 2016-11-01 – 2016-11-03 (×4): 20 mg via ORAL
  Filled 2016-11-01 (×4): qty 4

## 2016-11-01 MED ORDER — SOD CITRATE-CITRIC ACID 500-334 MG/5ML PO SOLN: 30.0000 mL | ORAL | Status: DC | PRN

## 2016-11-01 MED ORDER — FLEET ENEMA 7-19 GM/118ML RE ENEM: 1.0000 | ENEMA | RECTAL | Status: DC | PRN

## 2016-11-01 MED ORDER — OXYTOCIN 40 UNITS IN LACTATED RINGERS INFUSION - SIMPLE MED
2.5000 [IU]/h | INTRAVENOUS | Status: DC
  Administered 2016-11-02: 2.5 [IU]/h via INTRAVENOUS
  Filled 2016-11-01: qty 1000

## 2016-11-01 MED ORDER — CYCLOBENZAPRINE HCL 10 MG PO TABS
10.0000 mg | ORAL_TABLET | Freq: Three times a day (TID) | ORAL | Status: DC
  Administered 2016-11-01 – 2016-11-03 (×5): 10 mg via ORAL
  Filled 2016-11-01 (×9): qty 1

## 2016-11-01 MED ORDER — LACTATED RINGERS IV SOLN: 500.0000 mL | INTRAVENOUS | Status: DC | PRN

## 2016-11-01 MED ORDER — SERTRALINE HCL 25 MG PO TABS
25.0000 mg | ORAL_TABLET | Freq: Every day | ORAL | Status: DC
  Administered 2016-11-01 – 2016-11-03 (×3): 25 mg via ORAL
  Filled 2016-11-01 (×5): qty 1

## 2016-11-01 MED ORDER — ONDANSETRON HCL 4 MG/2ML IJ SOLN
4.0000 mg | Freq: Four times a day (QID) | INTRAMUSCULAR | Status: DC | PRN
  Administered 2016-11-01: 4 mg via INTRAVENOUS
  Filled 2016-11-01: qty 2

## 2016-11-01 MED ORDER — OXYTOCIN BOLUS FROM INFUSION
500.0000 mL | Freq: Once | INTRAVENOUS | Status: AC
  Administered 2016-11-02: 500 mL via INTRAVENOUS

## 2016-11-01 MED ORDER — FENTANYL CITRATE (PF) 100 MCG/2ML IJ SOLN
50.0000 ug | INTRAMUSCULAR | Status: DC | PRN
  Administered 2016-11-01 – 2016-11-02 (×3): 100 ug via INTRAVENOUS
  Filled 2016-11-01 (×3): qty 2

## 2016-11-01 NOTE — H&P (Signed)
Tracey Beard is a 36 y.o. female presenting for IOL for IUFD @ 70 weeks. Pregnancy followed by MFM. Poor growth followed by anhydramnios and reversed end diastolic flow. Yesterday MFM U/S noted IUFD. Patient denies bleeding or abdominal pain. Her first timester screen was increased risk for T-21. She declined amniio and TORCH titers. Also an elevated AFP noted. Has also had pseudotumor cerebri.  Pregnancy also complicated by gestational hypertension. Was on labetalol 200mg  tid. She took last dose yesterday am. Also chronic back pain and oxycodone addiction. Has seen chronic pain MD this pregnancy. Currently taking oxydocodone 20 mg po bid. She did take am dose today. OB History    Gravida Para Term Preterm AB Living   2 0     1 0   SAB TAB Ectopic Multiple Live Births   1             Past Medical History:  Diagnosis Date  . Chronic back pain   . Headache   . Hypertension   . Idiopathic intracranial hypertension   . Leukocytosis 02/25/2015   Past Surgical History:  Procedure Laterality Date  . NO PAST SURGERIES     Family History: family history includes Cancer in her maternal aunt, maternal uncle, mother, and paternal grandmother; Hypertension in her father, maternal aunt, maternal grandfather, maternal grandmother, maternal uncle, and mother. Social History:  reports that she has never smoked. She has never used smokeless tobacco. She reports that she drinks alcohol. She reports that she does not use drugs.     Maternal Diabetes: No Genetic Screening: Abnormal:  Results: Elevated risk of Trisomy 21 Maternal Ultrasounds/Referrals: Abnormal:  Findings:   IUGR Fetal Ultrasounds or other Referrals:  Referred to Materal Fetal Medicine  Maternal Substance Abuse:  Yes:  Type: Other: oxycodone Significant Maternal Medications:  Meds include: Zoloft Other: flexeril, labetalol Significant Maternal Lab Results:  None Other Comments:  None  Review of Systems  Eyes: Negative for blurred  vision.  Gastrointestinal: Negative for abdominal pain.   History Dilation: Closed Effacement (%): Thick Exam by:: Filip Luten Blood pressure (!) 160/97, pulse (!) 110, temperature 98.3 F (36.8 C), temperature source Oral, resp. rate 20, last menstrual period 05/03/2016. Maternal Exam:  Abdomen: Patient reports no abdominal tenderness.   Physical Exam  Cardiovascular: Normal rate and regular rhythm.   Respiratory: Effort normal and breath sounds normal.  GI: Soft. There is no tenderness.  Neurological: She has normal reflexes.   cervix closed/thick/high  Prenatal labs: ABO, Rh: --/--/AB POS (09/22 2158) Antibody:   Rubella:   RPR:    HBsAg:    HIV:    GBS:     Assessment/Plan: 36 yo G2P0 @ 26 0/7 weeks IUFD Gestational hypertension Chronic pain Oxycodone addiction Pseudotumor cerebri D/W Dr Lucia Gaskins and patient CBC, CMET, TORCH Continue oxycodone at present dose of 20mg  bid Continue flexeril, zoloft Labetalol 200mg  po tid D/W patient autopsy-she will consider   Dayveon Halley II,Shirl Ludington E 11/01/2016, 2:38 PM

## 2016-11-01 NOTE — Progress Notes (Signed)
Chaplain responded to a request from the Nursing Unit to request spiritual care support for the patient who has experienced a fetal demise. This patient was seen on yesterday in the Ultrasound Office where she was informed of the demise. Chaplain offered emotional support at that time, and also provided a listening presence for her today as she shared what a great loss this was for her and her husband because they had waited so long to have a child. Chaplain offered word of encouragement, and a prayer of healing and continued wellbeing for them both. The On-Call Chaplain will be informed for follow up as needed. Chaplain Yaakov Guthrie [phone]

## 2016-11-02 ENCOUNTER — Encounter (HOSPITAL_COMMUNITY): Payer: Self-pay

## 2016-11-02 LAB — HSV 2 ANTIBODY, IGG: HSV 2 Glycoprotein G Ab, IgG: <0.91 index (ref 0.00–0.90)

## 2016-11-02 LAB — CBC
HCT: 35.5 % — ABNORMAL LOW (ref 36.0–46.0)
Hemoglobin: 12.4 g/dL (ref 12.0–15.0)
MCH: 30.1 pg (ref 26.0–34.0)
MCHC: 34.9 g/dL (ref 30.0–36.0)
MCV: 86.2 fL (ref 78.0–100.0)
Platelets: 259 10*3/uL (ref 150–400)
RBC: 4.12 MIL/uL (ref 3.87–5.11)
RDW: 13.7 % (ref 11.5–15.5)
WBC: 17.3 10*3/uL — ABNORMAL HIGH (ref 4.0–10.5)

## 2016-11-02 LAB — CMV ANTIBODY, IGG (EIA): CMV Ab - IgG: >10 U/mL — ABNORMAL HIGH (ref 0.00–0.59)

## 2016-11-02 LAB — HSV 1 ANTIBODY, IGG: HSV 1 Glycoprotein G Ab, IgG: <0.91 index (ref 0.00–0.90)

## 2016-11-02 LAB — TOXOPLASMA GONDII ANTIBODY, IGG: Toxoplasma IgG Ratio: <3 IU/mL (ref 0.0–7.1)

## 2016-11-02 LAB — RPR: RPR Ser Ql: NONREACTIVE

## 2016-11-02 LAB — RUBELLA SCREEN: Rubella: 1.99 index (ref >0.99)

## 2016-11-02 MED ORDER — ACETAMINOPHEN 325 MG PO TABS: 650.0000 mg | ORAL_TABLET | ORAL | Status: DC | PRN

## 2016-11-02 MED ORDER — ZOLPIDEM TARTRATE 5 MG PO TABS: 5.0000 mg | ORAL_TABLET | Freq: Every evening | ORAL | Status: DC | PRN

## 2016-11-02 MED ORDER — PRENATAL MULTIVITAMIN CH
1.0000 | ORAL_TABLET | Freq: Every day | ORAL | Status: DC
  Administered 2016-11-02 – 2016-11-03 (×2): 1 via ORAL
  Filled 2016-11-02 (×2): qty 1

## 2016-11-02 MED ORDER — COCONUT OIL OIL: 1.0000 "application " | TOPICAL_OIL | Status: DC | PRN

## 2016-11-02 MED ORDER — SIMETHICONE 80 MG PO CHEW: 80.0000 mg | CHEWABLE_TABLET | ORAL | Status: DC | PRN

## 2016-11-02 MED ORDER — ONDANSETRON HCL 4 MG PO TABS
4.0000 mg | ORAL_TABLET | ORAL | Status: DC | PRN
  Filled 2016-11-02: qty 1

## 2016-11-02 MED ORDER — DIPHENHYDRAMINE HCL 25 MG PO CAPS: 25.0000 mg | ORAL_CAPSULE | Freq: Four times a day (QID) | ORAL | Status: DC | PRN

## 2016-11-02 MED ORDER — OXYCODONE HCL 5 MG PO TABS: 5.0000 mg | ORAL_TABLET | ORAL | Status: DC | PRN

## 2016-11-02 MED ORDER — TETANUS-DIPHTH-ACELL PERTUSSIS 5-2.5-18.5 LF-MCG/0.5 IM SUSP: 0.5000 mL | Freq: Once | INTRAMUSCULAR | Status: DC

## 2016-11-02 MED ORDER — SENNOSIDES-DOCUSATE SODIUM 8.6-50 MG PO TABS
2.0000 | ORAL_TABLET | ORAL | Status: DC
  Administered 2016-11-02: 2 via ORAL
  Filled 2016-11-02: qty 2

## 2016-11-02 MED ORDER — ONDANSETRON HCL 4 MG/2ML IJ SOLN: 4.0000 mg | INTRAMUSCULAR | Status: DC | PRN

## 2016-11-02 MED ORDER — HYDROMORPHONE HCL 1 MG/ML IJ SOLN
2.0000 mg | INTRAMUSCULAR | Status: DC | PRN
  Administered 2016-11-02 (×2): 2 mg via INTRAVENOUS
  Filled 2016-11-02 (×2): qty 2

## 2016-11-02 MED ORDER — IBUPROFEN 600 MG PO TABS
600.0000 mg | ORAL_TABLET | Freq: Four times a day (QID) | ORAL | Status: DC
  Administered 2016-11-02 – 2016-11-03 (×5): 600 mg via ORAL
  Filled 2016-11-02 (×5): qty 1

## 2016-11-02 NOTE — Progress Notes (Signed)
Delivery Note Patient complained of pain and pressure. Nurse check was 5 cm dilated. Then rapid progress to en cul delivery of nonviable fetus and placenta prior to my arrival. Upon my arrival patient stable in bed. Placenta appears intact. Speculum exam showed cervix/vagina/perineum to be intact. Good hemostasis. Post partum pitocin started. Fetus intact, edematous. Will send placenta to path. Will discuss autopsy with patient.

## 2016-11-02 NOTE — Progress Notes (Signed)
Post Partum Day 0 Subjective: up ad lib, voiding, tolerating PO and appropriately tearful, holding infant Declines autopsy, has decided to have funeral , [employer] Objective: Blood pressure (!) 137/92, pulse (!) 103, temperature 98.6 F (37 C), temperature source Oral, resp. rate 18, height 5\' 1"  (1.549 m), weight 235 lb (106.6 kg), last menstrual period 05/03/2016, SpO2 99 %.  Physical Exam:  General: alert, cooperative and fatigued Lochia: appropriate Uterine Fundus: firm Incision: perineum intact DVT Evaluation: No evidence of DVT seen on physical exam. Negative Homan's sign. No cords or calf tenderness. Calf/Ankle edema is present.   Recent Labs  11/01/16 1510 11/02/16 0803  HGB 14.0 12.4  HCT 39.2 35.5*    Assessment/Plan: Plan for discharge tomorrow   LOS: 1 day   CURTIS,CAROL G 11/02/2016, 8:38 AM

## 2016-11-02 NOTE — Progress Notes (Signed)
Follow up visit with Tracey Beard after the delivery of her daughter, Tracey Beard, who she lost in utero.  When I entered the room, Tracey Beard was holding the baby and apologized for crying when I asked how she was doing.  We discussed her grief and need to hold the baby and fear of putting her down or leaving her.  I reminded her that she does not have to put her down until she's ready.  She shared that her husband thinks she's "crazy" for holding and talking to her. This is his first experience with death.  His father was killed in a bank robbery when his mother was 7 months pregnant.  He is tearful with her when discussing it and has been tearful sometimes since losing the baby, but he wants to just move forward.  We discussed her pregnancy and her deep desire for a baby and how much Tracey Beard was loved and wanted.  Tracey Beard shared about some of the [employer] they'd already created.  I brought her molds to the room because she was fearful she would forget them.  She also mentioned someone bringing some clothes and we discussed her favorite color.  I brought her an assortment of purple items to choose from.  I also procured the cuddle cot, as she desires to keep the baby with her until she has to be discharged.  She shared that she had hoped to be discharged today because she doesn't feel like she can let go until she's discharged. We discussed the gift of time and she settled in knowing that she's okay to take the time she needs.  Please page as further needs arise.  Donald Prose. Elyn Peers, M.Div. [facility] Chaplain Pager [phone] Office [phone]

## 2016-11-02 NOTE — Progress Notes (Signed)
Called regarding pain management. Fentanyl is no longer giving adequate pain relief.  D/W pharmacy Will begin Dilaudid 2mg  IV q2hours prn

## 2016-11-03 ENCOUNTER — Ambulatory Visit (HOSPITAL_COMMUNITY): Admission: RE | Admit: 2016-11-03 | Payer: Managed Care, Other (non HMO) | Source: Ambulatory Visit

## 2016-11-03 LAB — COMPREHENSIVE METABOLIC PANEL
ALT: 21 U/L (ref 14–54)
AST: 23 U/L (ref 15–41)
Albumin: 3.3 g/dL — ABNORMAL LOW (ref 3.5–5.0)
Alkaline Phosphatase: 68 U/L (ref 38–126)
Anion gap: 9 (ref 5–15)
BUN: 6 mg/dL (ref 6–20)
CO2: 24 mmol/L (ref 22–32)
Calcium: 9.1 mg/dL (ref 8.9–10.3)
Chloride: 105 mmol/L (ref 101–111)
Creatinine, Ser: 0.48 mg/dL (ref 0.44–1.00)
GFR calc Af Amer: >60 mL/min (ref >60)
GFR calc non Af Amer: >60 mL/min (ref >60)
Glucose, Bld: 127 mg/dL — ABNORMAL HIGH (ref 65–99)
Potassium: 3.7 mmol/L (ref 3.5–5.1)
Sodium: 138 mmol/L (ref 135–145)
Total Bilirubin: 0.2 mg/dL — ABNORMAL LOW (ref 0.3–1.2)
Total Protein: 6.7 g/dL (ref 6.5–8.1)

## 2016-11-03 LAB — CBC
HCT: 34.9 % — ABNORMAL LOW (ref 36.0–46.0)
Hemoglobin: 12 g/dL (ref 12.0–15.0)
MCH: 30 pg (ref 26.0–34.0)
MCHC: 34.4 g/dL (ref 30.0–36.0)
MCV: 87.3 fL (ref 78.0–100.0)
Platelets: 252 10*3/uL (ref 150–400)
RBC: 4 MIL/uL (ref 3.87–5.11)
RDW: 14 % (ref 11.5–15.5)
WBC: 14.3 10*3/uL — ABNORMAL HIGH (ref 4.0–10.5)

## 2016-11-03 MED ORDER — CYCLOBENZAPRINE HCL 10 MG PO TABS: 10.0000 mg | ORAL_TABLET | Freq: Three times a day (TID) | ORAL | 0 refills | Status: DC

## 2016-11-03 MED ORDER — LABETALOL HCL 200 MG PO TABS: 200.0000 mg | ORAL_TABLET | Freq: Three times a day (TID) | ORAL | 1 refills | Status: DC

## 2016-11-03 MED ORDER — SERTRALINE HCL 25 MG PO TABS: 25.0000 mg | ORAL_TABLET | Freq: Every day | ORAL | 1 refills | Status: DC

## 2016-11-03 NOTE — Consult Note (Signed)
Lactation After Loss handout given to patient's RN to give to patient.  Phone number available for support after discharge.

## 2016-11-03 NOTE — Discharge Summary (Signed)
Obstetric Discharge Summary Reason for Admission: induction of labor Prenatal Procedures: ultrasound Intrapartum Procedures: spontaneous vaginal delivery Postpartum Procedures: none Complications-Operative and Postpartum: none Hemoglobin  Date Value Ref Range Status  11/02/2016 12.4 12.0 - 15.0 g/dL Final   HGB  Date Value Ref Range Status  02/25/2015 12.0 11.6 - 15.9 g/dL Final   HCT  Date Value Ref Range Status  11/02/2016 35.5 (L) 36.0 - 46.0 % Final  02/25/2015 35.6 34.8 - 46.6 % Final    Physical Exam:  General: alert and cooperative Lochia: appropriate Uterine Fundus: firm Incision: perineum intact DVT Evaluation: No evidence of DVT seen on physical exam. Negative Homan's sign. No cords or calf tenderness. Calf/Ankle edema is present.  Discharge Diagnoses: delivery of non- viable fetus at 26 weeks  Discharge Information: Date: 11/03/2016 Activity: pelvic rest Diet: routine Medications: PNV, Ibuprofen and Flexeril and Zoloft  Condition: stable Instructions: refer to practice specific booklet Discharge to: home   Newborn Data: Live born female  Birth Weight: 9.7 oz (275 g) APGAR: ,   Baby to be cremated   Stepfon Rawles G 11/03/2016, 9:15 AM

## 2016-11-07 ENCOUNTER — Ambulatory Visit (HOSPITAL_COMMUNITY): Payer: Managed Care, Other (non HMO)

## 2016-11-10 ENCOUNTER — Ambulatory Visit (HOSPITAL_COMMUNITY): Payer: Managed Care, Other (non HMO)

## 2016-11-14 ENCOUNTER — Ambulatory Visit (HOSPITAL_COMMUNITY): Payer: Managed Care, Other (non HMO)

## 2016-11-17 ENCOUNTER — Ambulatory Visit (HOSPITAL_COMMUNITY): Payer: Managed Care, Other (non HMO)

## 2016-11-23 MED FILL — Betamethasone Sod Phosphate & Acetate Inj Susp 6 (3-3) MG/ML: INTRAMUSCULAR | Qty: 2 | Status: AC

## 2016-11-24 ENCOUNTER — Encounter (HOSPITAL_COMMUNITY): Payer: Self-pay | Admitting: *Deleted

## 2016-11-28 ENCOUNTER — Ambulatory Visit (HOSPITAL_COMMUNITY)
Admission: RE | Admit: 2016-11-28 | Discharge: 2016-11-28 | Disposition: A | Discharge status: Home / Self Care | Payer: Managed Care, Other (non HMO) | Source: Ambulatory Visit | Attending: Obstetrics and Gynecology | Admitting: Obstetrics and Gynecology

## 2016-11-28 ENCOUNTER — Encounter (HOSPITAL_COMMUNITY): Payer: Self-pay

## 2016-12-14 ENCOUNTER — Ambulatory Visit (HOSPITAL_COMMUNITY)
Admission: RE | Admit: 2016-12-14 | Discharge: 2016-12-14 | Disposition: A | Discharge status: Home / Self Care | Payer: Managed Care, Other (non HMO) | Source: Ambulatory Visit | Attending: Obstetrics and Gynecology | Admitting: Obstetrics and Gynecology

## 2016-12-14 ENCOUNTER — Encounter (HOSPITAL_COMMUNITY): Payer: Self-pay

## 2016-12-14 DIAGNOSIS — Z3169 Encounter for other general counseling and advice on procreation: Secondary | ICD-10-CM | POA: Diagnosis not present

## 2016-12-14 NOTE — Consult Note (Signed)
Maternal Fetal Medicine Consultation  Requesting Provider(s): Dian Queen, MD  Reason for consultation: Severe, early onset fetal growth restriction and IUFD at [redacted] weeks gestation  HPI: Tracey Beard is a 36 yo G2P0110 seen for consultation and preconception counseling.  Tracey Beard was followed by MFM service due to severe, early onset fetal growth restriction associated with absent and reversed diastolic flow.  She ultimately experienced a fetal demise at 26 weeks of a fetus weighing 275 g and underwent an induction of labor without complications.  Work up at the time of diagnosis included viral serologies (no evidence of acute infections) and cell free fetal DNA that was low risk for fetal aneuploidy.  Placental pathology revealed a second trimester placenta (98 g) with infarct (< 5%) but no other pathology.  She denies preeclampsia but reports that she may have had some elevated blood pressures following delivery.  Following the induction of labor, the patient returned home to [place] where unfortunately she underwent a cholecystectomy.  Her Mother passed away due to a prolonged illness shortly after arriving there.  She is currently on Zoloft,  Alprazolam and Flexeril.  She was very tearful - reports that she has started drinking again and having a very difficult time grieving. She denies SI/ HI.  OB History: OB History    Gravida Para Term Preterm AB Living   2 1   1 1  0   SAB TAB Ectopic Multiple Live Births   1              PMH:  Past Medical History:  Diagnosis Date  . Chronic back pain   . Headache   . Hypertension   . Idiopathic intracranial hypertension   . Leukocytosis 02/25/2015    PSH:  Past Surgical History:  Procedure Laterality Date  . NO PAST SURGERIES     Meds:  Current Outpatient Prescriptions on File Prior to Encounter  Medication Sig Dispense Refill  . cyclobenzaprine (FLEXERIL) 10 MG tablet Take 1 tablet (10 mg total) by mouth 3 (three) times daily.  30 tablet 0  . labetalol (NORMODYNE) 200 MG tablet Take 1 tablet (200 mg total) by mouth 3 (three) times daily. 90 tablet 1  . Prenatal Vit-Fe Fumarate-FA (PRENATAL MULTIVITAMIN) TABS tablet Take 1 tablet by mouth daily.    . sertraline (ZOLOFT) 25 MG tablet Take 1 tablet (25 mg total) by mouth daily. (Patient taking differently: Take 100 mg by mouth daily. ) 30 tablet 1  . acetaminophen (TYLENOL) 500 MG tablet Take 1,000 mg by mouth every 4 (four) hours as needed for mild pain, moderate pain or headache.      No current facility-administered medications on file prior to encounter.    Allergies:  Allergies  Allergen Reactions  . Elavil [Amitriptyline Hcl] Shortness Of Breath  . Trazodone And Nefazodone Anaphylaxis  . Diamox [Acetazolamide] Other (See Comments)    Reaction:  Cold chills/loss of taste   . Maxalt [Rizatriptan Benzoate] Other (See Comments)    Reaction:  Chest tightness   . Benzocaine Swelling, Rash and Other (See Comments)    Reaction:  Localized swelling  . Caine-1 [Lidocaine Hcl] Swelling, Rash and Other (See Comments)    Reaction:  Localized swelling  . Latex Rash  . Novocain [Procaine] Swelling, Rash and Other (See Comments)    Reaction:  Localized swelling   FH:  Family History  Problem Relation Age of Onset  . Cancer Mother   . Hypertension Mother   . Hypertension Father   .  Cancer Maternal Aunt   . Hypertension Maternal Aunt   . Cancer Maternal Uncle   . Hypertension Maternal Uncle   . Hypertension Maternal Grandmother   . Hypertension Maternal Grandfather   . Cancer Paternal Grandmother    Soc:  Social History   Social History  . Marital status: Married    Spouse name: N/A  . Number of children: N/A  . Years of education: N/A   Occupational History  . Not on file.   Social History Main Topics  . Smoking status: Never Smoker  . Smokeless tobacco: Never Used  . Alcohol use Yes     Comment: socially  . Drug use: No  . Sexual activity: No    Other Topics Concern  . Not on file   Social History Narrative  . No narrative on file    PE:  238 lbs, 122/59, 91   A/P: 1) s/p recent fetal demise - Tracey Beard was followed due to severe, early onset fetal growth restriction with abnormal fetal Doppler studies.  She underwent viral serologies that were negative for recent infections and cell free fetal DNA was low risk for aneuploidy.  Placenta path was remarkable for some infarct (< 5%).  Based on early onset IUGR and IUFD, would recommend APS work up.  Anticardiolipin, lupus anticoagulant and beta2 glycoprotein antibodies were ordered.  We briefly discussed that she could continue to be at high risk for recurrence - but that there may be some treatment options available if the APS is diagnosed.   2) Mood - Tracey Beard had a recent IUFD and lost her mother following a prolonged illness.  She is currently on Zoloft and Xanax and reports that she has recently started drinking.  While she currently denies suicidal or homicidal ideation, she is clearly having some difficulty coping with the losses.  After discussion with Social Work, the patient and her husband were planning to go directly to the [facility] for further mental health counseling.  Recommendations: 1) If any of the APS labs are positive, would repeat in 12 weeks.  If both positive, would treat as APS during future pregnancies and start baby aspirin and a prophylactic dose of Lovenox once a viable fetus is diagnosed. 2) If labs are not positive, would still treat with baby aspirin beginning by [redacted] weeks gestation in future pregnancies. 3) Detailed ultrasound for anatomy at 18 weeks; serial ultrasounds every 3-4 weeks thereafter 4) Antenatal testing beginning no later than [redacted] weeks gestation and delivery by 39 weeks or earlier based on the clinical scenario.   Thank you for the opportunity to be a part of the care of Tracey Beard. Please contact our office if we can be of  further assistance.   I spent approximately 30 minutes with this patient with over 50% of time spent in face-to-face counseling.  Benjaman Lobe, MD Maternal Fetal Medicine

## 2016-12-14 NOTE — ED Notes (Signed)
Pt here for preconception consult in [place] with Dr. Lucia Gaskins.  BP 122/59 HR 91.

## 2016-12-14 NOTE — ED Notes (Signed)
Pt very tearful, not handling the emotional stress of loosing a baby and her mother in a short period of time.  Spoke to [employer], Education officer, museum, she gave the information on 2 walk in clinics for counseling.  Information given to patient and her husband.  He plans to take her directly to the [facility].

## 2016-12-15 LAB — LUPUS ANTICOAGULANT PANEL
DRVVT: 40.5 s (ref 0.0–47.0)
PTT Lupus Anticoagulant: 33.2 s (ref 0.0–51.9)

## 2016-12-15 LAB — BETA-2-GLYCOPROTEIN I ABS, IGG/M/A
Beta-2 Glyco I IgG: <9 GPI IgG units (ref 0–20)
Beta-2-Glycoprotein I IgA: <9 GPI IgA units (ref 0–25)
Beta-2-Glycoprotein I IgM: <9 GPI IgM units (ref 0–32)

## 2016-12-17 LAB — CARDIOLIPIN ANTIBODIES, IGG, IGM, IGA
Anticardiolipin IgA: <9 APL U/mL (ref 0–11)
Anticardiolipin IgG: <9 GPL U/mL (ref 0–14)
Anticardiolipin IgM: <9 MPL U/mL (ref 0–12)

## 2016-12-28 ENCOUNTER — Telehealth (HOSPITAL_COMMUNITY): Payer: Self-pay | Admitting: *Deleted

## 2016-12-28 NOTE — Telephone Encounter (Signed)
TC to review labs drawn 3/29, LM requesting for patient to return call to RN.

## 2017-01-06 DIAGNOSIS — F4321 Adjustment disorder with depressed mood: Secondary | ICD-10-CM | POA: Insufficient documentation

## 2017-04-07 DIAGNOSIS — M461 Sacroiliitis, not elsewhere classified: Secondary | ICD-10-CM | POA: Insufficient documentation

## 2017-05-05 ENCOUNTER — Inpatient Hospital Stay (HOSPITAL_COMMUNITY): Payer: Managed Care, Other (non HMO)

## 2017-05-05 ENCOUNTER — Observation Stay (HOSPITAL_COMMUNITY)
Admission: AD | Admit: 2017-05-05 | Discharge: 2017-05-06 | DRG: 392 | Disposition: A | Discharge status: Other Acute Inpatient Hospital | Payer: Managed Care, Other (non HMO) | Source: Ambulatory Visit | Attending: Emergency Medicine | Admitting: Emergency Medicine

## 2017-05-05 ENCOUNTER — Encounter (HOSPITAL_COMMUNITY): Payer: Self-pay

## 2017-05-05 DIAGNOSIS — R1032 Left lower quadrant pain: Principal | ICD-10-CM

## 2017-05-05 DIAGNOSIS — Z6838 Body mass index (BMI) 38.0-38.9, adult: Secondary | ICD-10-CM

## 2017-05-05 DIAGNOSIS — Z91040 Latex allergy status: Secondary | ICD-10-CM

## 2017-05-05 DIAGNOSIS — Z91018 Allergy to other foods: Secondary | ICD-10-CM | POA: Diagnosis not present

## 2017-05-05 DIAGNOSIS — D219 Benign neoplasm of connective and other soft tissue, unspecified: Secondary | ICD-10-CM | POA: Diagnosis not present

## 2017-05-05 DIAGNOSIS — Z8249 Family history of ischemic heart disease and other diseases of the circulatory system: Secondary | ICD-10-CM

## 2017-05-05 DIAGNOSIS — Z9049 Acquired absence of other specified parts of digestive tract: Secondary | ICD-10-CM

## 2017-05-05 DIAGNOSIS — G8929 Other chronic pain: Secondary | ICD-10-CM | POA: Diagnosis present

## 2017-05-05 DIAGNOSIS — Z885 Allergy status to narcotic agent status: Secondary | ICD-10-CM | POA: Diagnosis not present

## 2017-05-05 DIAGNOSIS — G932 Benign intracranial hypertension: Secondary | ICD-10-CM | POA: Diagnosis present

## 2017-05-05 DIAGNOSIS — F329 Major depressive disorder, single episode, unspecified: Secondary | ICD-10-CM | POA: Diagnosis not present

## 2017-05-05 DIAGNOSIS — R112 Nausea with vomiting, unspecified: Secondary | ICD-10-CM

## 2017-05-05 DIAGNOSIS — F1120 Opioid dependence, uncomplicated: Secondary | ICD-10-CM | POA: Diagnosis present

## 2017-05-05 DIAGNOSIS — Z79899 Other long term (current) drug therapy: Secondary | ICD-10-CM

## 2017-05-05 DIAGNOSIS — F1020 Alcohol dependence, uncomplicated: Secondary | ICD-10-CM | POA: Diagnosis not present

## 2017-05-05 DIAGNOSIS — Z888 Allergy status to other drugs, medicaments and biological substances status: Secondary | ICD-10-CM | POA: Diagnosis not present

## 2017-05-05 DIAGNOSIS — F419 Anxiety disorder, unspecified: Secondary | ICD-10-CM | POA: Diagnosis not present

## 2017-05-05 DIAGNOSIS — E669 Obesity, unspecified: Secondary | ICD-10-CM | POA: Diagnosis not present

## 2017-05-05 DIAGNOSIS — I10 Essential (primary) hypertension: Secondary | ICD-10-CM | POA: Diagnosis present

## 2017-05-05 DIAGNOSIS — R109 Unspecified abdominal pain: Secondary | ICD-10-CM

## 2017-05-05 DIAGNOSIS — E876 Hypokalemia: Secondary | ICD-10-CM

## 2017-05-05 HISTORY — DX: Gestational (pregnancy-induced) hypertension without significant proteinuria, unspecified trimester: O13.9

## 2017-05-05 LAB — URINALYSIS, ROUTINE W REFLEX MICROSCOPIC
Bilirubin Urine: NEGATIVE
Glucose, UA: NEGATIVE mg/dL
Hgb urine dipstick: NEGATIVE
Ketones, ur: NEGATIVE mg/dL
Leukocytes, UA: NEGATIVE
Nitrite: NEGATIVE
Protein, ur: NEGATIVE mg/dL
Specific Gravity, Urine: >1.03 — ABNORMAL HIGH (ref 1.005–1.030)
pH: 5.5 (ref 5.0–8.0)

## 2017-05-05 LAB — COMPREHENSIVE METABOLIC PANEL
ALT: 27 U/L (ref 14–54)
AST: 22 U/L (ref 15–41)
Albumin: 4.4 g/dL (ref 3.5–5.0)
Alkaline Phosphatase: 57 U/L (ref 38–126)
Anion gap: 10 (ref 5–15)
BUN: 8 mg/dL (ref 6–20)
CO2: 23 mmol/L (ref 22–32)
Calcium: 9.3 mg/dL (ref 8.9–10.3)
Chloride: 108 mmol/L (ref 101–111)
Creatinine, Ser: 0.6 mg/dL (ref 0.44–1.00)
GFR calc Af Amer: >60 mL/min (ref >60)
GFR calc non Af Amer: >60 mL/min (ref >60)
Glucose, Bld: 75 mg/dL (ref 65–99)
Potassium: 3.1 mmol/L — ABNORMAL LOW (ref 3.5–5.1)
Sodium: 141 mmol/L (ref 135–145)
Total Bilirubin: 0.5 mg/dL (ref 0.3–1.2)
Total Protein: 8.1 g/dL (ref 6.5–8.1)

## 2017-05-05 LAB — CBC WITH DIFFERENTIAL/PLATELET
Basophils Absolute: 0 10*3/uL (ref 0.0–0.1)
Basophils Relative: 0 %
Eosinophils Absolute: 0.1 10*3/uL (ref 0.0–0.7)
Eosinophils Relative: 1 %
HCT: 38 % (ref 36.0–46.0)
Hemoglobin: 13.1 g/dL (ref 12.0–15.0)
Lymphocytes Relative: 35 %
Lymphs Abs: 4.7 10*3/uL — ABNORMAL HIGH (ref 0.7–4.0)
MCH: 30.8 pg (ref 26.0–34.0)
MCHC: 34.5 g/dL (ref 30.0–36.0)
MCV: 89.4 fL (ref 78.0–100.0)
Monocytes Absolute: 0.8 10*3/uL (ref 0.1–1.0)
Monocytes Relative: 6 %
Neutro Abs: 7.9 10*3/uL — ABNORMAL HIGH (ref 1.7–7.7)
Neutrophils Relative %: 58 %
Platelets: 281 10*3/uL (ref 150–400)
RBC: 4.25 MIL/uL (ref 3.87–5.11)
RDW: 14.3 % (ref 11.5–15.5)
WBC: 13.5 10*3/uL — ABNORMAL HIGH (ref 4.0–10.5)

## 2017-05-05 LAB — POCT PREGNANCY, URINE: Preg Test, Ur: NEGATIVE

## 2017-05-05 LAB — LIPASE, BLOOD: Lipase: 65 U/L — ABNORMAL HIGH (ref 11–51)

## 2017-05-05 LAB — HCG, SERUM, QUALITATIVE: Preg, Serum: NEGATIVE

## 2017-05-05 MED ORDER — PROMETHAZINE HCL 25 MG/ML IJ SOLN
25.0000 mg | Freq: Once | INTRAMUSCULAR | Status: AC
  Administered 2017-05-05: 25 mg via INTRAVENOUS
  Filled 2017-05-05: qty 1

## 2017-05-05 MED ORDER — SODIUM CHLORIDE 0.9 % IV SOLN
INTRAVENOUS | Status: DC
  Administered 2017-05-05 (×2): via INTRAVENOUS

## 2017-05-05 MED ORDER — FENTANYL CITRATE (PF) 100 MCG/2ML IJ SOLN
50.0000 ug | Freq: Once | INTRAMUSCULAR | Status: AC
  Administered 2017-05-05: 50 ug via INTRAVENOUS
  Filled 2017-05-05: qty 2

## 2017-05-05 MED ORDER — SODIUM CHLORIDE 0.9 % IV BOLUS (SEPSIS)
500.0000 mL | Freq: Once | INTRAVENOUS | Status: AC
  Administered 2017-05-05: 500 mL via INTRAVENOUS

## 2017-05-05 MED ORDER — ONDANSETRON HCL 4 MG/2ML IJ SOLN
4.0000 mg | Freq: Once | INTRAMUSCULAR | Status: AC
  Administered 2017-05-05: 4 mg via INTRAVENOUS
  Filled 2017-05-05: qty 2

## 2017-05-05 MED ORDER — IOPAMIDOL (ISOVUE-300) INJECTION 61%
INTRAVENOUS | Status: AC
  Administered 2017-05-05: 100 mL via INTRAVENOUS
  Filled 2017-05-05: qty 100

## 2017-05-05 MED ORDER — KETOROLAC TROMETHAMINE 30 MG/ML IJ SOLN
30.0000 mg | Freq: Once | INTRAMUSCULAR | Status: AC
  Administered 2017-05-05: 30 mg via INTRAVENOUS
  Filled 2017-05-05: qty 1

## 2017-05-05 MED ORDER — HYDROMORPHONE HCL 1 MG/ML IJ SOLN
1.0000 mg | Freq: Once | INTRAMUSCULAR | Status: DC
Start: 2017-05-05 — End: 2017-05-05

## 2017-05-05 MED ORDER — ONDANSETRON 8 MG PO TBDP
8.0000 mg | ORAL_TABLET | Freq: Once | ORAL | Status: AC
  Administered 2017-05-05: 8 mg via ORAL
  Filled 2017-05-05: qty 1

## 2017-05-05 NOTE — MAU Note (Signed)
Reports left sided abdominal pain for 1 week. Has progressively gotten worse. States her and her husband are trying to concieve after a 7 month demise in February and was worried about potential complications/ectopic.

## 2017-05-05 NOTE — MAU Provider Note (Signed)
History     CSN: [id number]  Arrival date and time: 05/05/17 2423   First Provider Initiated Contact with Patient 05/05/17 1719      Chief Complaint  Patient presents with  . Abdominal Pain   HPI   Ms.Tracey Beard Is a 36 y.o. non pregnant female G2P0110 with a history of Major depressive disorder, recurrent severe without psychotic features, acute alcohol dependence, uncomplicated Opioid dependence, here in MAU with left lower quadrant pain. The pain started 2 weeks ago. The pain comes and goes. The pain occurs multiple times per day. The pain has worsened today. She attests to having one alcoholic beverage at lunch today, however does not drink alcohol regularly. She is trying to conceive. And is currently taking Clomid. Period is due to start any day. She denies vaginal bleeding or discharge. Married and one partner.   History of IUFD this year and loss of her mother. This is what initiated the acute alcohol dependence.   OB History    Gravida Para Term Preterm AB Living   '2 1   1 1 '$ 0   SAB TAB Ectopic Multiple Live Births   1              Past Medical History:  Diagnosis Date  . Chronic back pain   . Headache   . Hypertension   . Idiopathic intracranial hypertension   . Leukocytosis 02/25/2015  . Pregnancy induced hypertension     Past Surgical History:  Procedure Laterality Date  . CHOLECYSTECTOMY      Family History  Problem Relation Age of Onset  . Cancer Mother   . Hypertension Mother   . Hypertension Father   . Cancer Maternal Aunt   . Hypertension Maternal Aunt   . Cancer Maternal Uncle   . Hypertension Maternal Uncle   . Hypertension Maternal Grandmother   . Hypertension Maternal Grandfather   . Cancer Paternal Grandmother     Social History  Substance Use Topics  . Smoking status: Never Smoker  . Smokeless tobacco: Never Used  . Alcohol use Yes     Comment: socially    Allergies:  Allergies  Allergen Reactions  . Elavil [Amitriptyline  Hcl] Shortness Of Breath  . Restoril [Temazepam] Shortness Of Breath  . Trazodone And Nefazodone Anaphylaxis  . Diamox [Acetazolamide] Other (See Comments)    Reaction:  Cold chills/loss of taste   . Maxalt [Rizatriptan Benzoate] Other (See Comments)    Reaction:  Chest tightness   . Benzocaine Swelling, Rash and Other (See Comments)    Reaction:  Localized swelling  . Caine-1 [Lidocaine Hcl] Swelling, Rash and Other (See Comments)    Reaction:  Localized swelling  . Latex Rash  . Novocain [Procaine] Swelling, Rash and Other (See Comments)    Reaction:  Localized swelling    Prescriptions Prior to Admission  Medication Sig Dispense Refill Last Dose  . acetaminophen (TYLENOL) 500 MG tablet Take 1,000 mg by mouth every 4 (four) hours as needed for mild pain, moderate pain or headache.    Not Taking  . cyclobenzaprine (FLEXERIL) 10 MG tablet Take 1 tablet (10 mg total) by mouth 3 (three) times daily. 30 tablet 0 Taking  . labetalol (NORMODYNE) 200 MG tablet Take 1 tablet (200 mg total) by mouth 3 (three) times daily. 90 tablet 1 Taking  . Prenatal Vit-Fe Fumarate-FA (PRENATAL MULTIVITAMIN) TABS tablet Take 1 tablet by mouth daily.   Taking  . sertraline (ZOLOFT) 25 MG tablet Take 1  tablet (25 mg total) by mouth daily. (Patient taking differently: Take 100 mg by mouth daily. ) 30 tablet 1 Taking   Results for orders placed or performed during the hospital encounter of 05/05/17 (from the past 48 hour(s))  Urinalysis, Routine w reflex microscopic     Status: Abnormal   Collection Time: 05/05/17  4:36 PM  Result Value Ref Range   Color, Urine YELLOW YELLOW   APPearance CLOUDY (A) CLEAR   Specific Gravity, Urine >1.030 (H) 1.005 - 1.030   pH 5.5 5.0 - 8.0   Glucose, UA NEGATIVE NEGATIVE mg/dL   Hgb urine dipstick NEGATIVE NEGATIVE   Bilirubin Urine NEGATIVE NEGATIVE   Ketones, ur NEGATIVE NEGATIVE mg/dL   Protein, ur NEGATIVE NEGATIVE mg/dL   Nitrite NEGATIVE NEGATIVE   Leukocytes, UA  NEGATIVE NEGATIVE    Comment: Microscopic not done on urines with negative protein, blood, leukocytes, nitrite, or glucose < 500 mg/dL.  Pregnancy, urine POC     Status: None   Collection Time: 05/05/17  4:39 PM  Result Value Ref Range   Preg Test, Ur NEGATIVE NEGATIVE    Comment:        THE SENSITIVITY OF THIS METHODOLOGY IS >24 mIU/mL   Comprehensive metabolic panel     Status: Abnormal   Collection Time: 05/05/17  5:33 PM  Result Value Ref Range   Sodium 141 135 - 145 mmol/L   Potassium 3.1 (L) 3.5 - 5.1 mmol/L   Chloride 108 101 - 111 mmol/L   CO2 23 22 - 32 mmol/L   Glucose, Bld 75 65 - 99 mg/dL   BUN 8 6 - 20 mg/dL   Creatinine, Ser 5.88 0.44 - 1.00 mg/dL   Calcium 9.3 8.9 - 50.2 mg/dL   Total Protein 8.1 6.5 - 8.1 g/dL   Albumin 4.4 3.5 - 5.0 g/dL   AST 22 15 - 41 U/L   ALT 27 14 - 54 U/L   Alkaline Phosphatase 57 38 - 126 U/L   Total Bilirubin 0.5 0.3 - 1.2 mg/dL   GFR calc non Af Amer >60 >60 mL/min   GFR calc Af Amer >60 >60 mL/min    Comment: (NOTE) The eGFR has been calculated using the CKD EPI equation. This calculation has not been validated in all clinical situations. eGFR's persistently <60 mL/min signify possible Chronic Kidney Disease.    Anion gap 10 5 - 15  CBC with Differential     Status: Abnormal   Collection Time: 05/05/17  5:33 PM  Result Value Ref Range   WBC 13.5 (H) 4.0 - 10.5 K/uL   RBC 4.25 3.87 - 5.11 MIL/uL   Hemoglobin 13.1 12.0 - 15.0 g/dL   HCT 77.4 12.8 - 78.6 %   MCV 89.4 78.0 - 100.0 fL   MCH 30.8 26.0 - 34.0 pg   MCHC 34.5 30.0 - 36.0 g/dL   RDW 76.7 20.9 - 47.0 %   Platelets 281 150 - 400 K/uL   Neutrophils Relative % 58 %   Neutro Abs 7.9 (H) 1.7 - 7.7 K/uL   Lymphocytes Relative 35 %   Lymphs Abs 4.7 (H) 0.7 - 4.0 K/uL   Monocytes Relative 6 %   Monocytes Absolute 0.8 0.1 - 1.0 K/uL   Eosinophils Relative 1 %   Eosinophils Absolute 0.1 0.0 - 0.7 K/uL   Basophils Relative 0 %   Basophils Absolute 0.0 0.0 - 0.1 K/uL   Lipase, blood     Status: Abnormal   Collection  Time: 05/05/17  5:33 PM  Result Value Ref Range   Lipase 65 (H) 11 - 51 U/L  hCG, serum, qualitative     Status: None   Collection Time: 05/05/17  5:33 PM  Result Value Ref Range   Preg, Serum NEGATIVE NEGATIVE    Comment:        THE SENSITIVITY OF THIS METHODOLOGY IS >10 mIU/mL.    US Transvaginal Non-ob  Result Date: 05/05/2017 CLINICAL DATA:  Pelvic pain EXAM: TRANSABDOMINAL AND TRANSVAGINAL ULTRASOUND OF PELVIS TECHNIQUE: Study was performed transabdominally to optimize pelvic field of view evaluation and transvaginally to optimize internal visceral architecture evaluation. COMPARISON:  None FINDINGS: Uterus Measurements: 9.3 x 5.0 x 5.6 cm. There is a hypoechoic mass in the leftward aspect of the uterine fundus measuring 1.6 x 0.9 x 0.8 cm, a small leiomyoma. No other intrauterine mass. Endometrium Thickness: 12 mm.  No focal abnormality visualized. Right ovary Measurements: 4.6 x 2.9 x 1.7 cm. Normal appearance/no adnexal mass. Left ovary Measurements: 3.9 x 2.3 x 2.2 cm. Normal appearance/no adnexal mass. Other findings No abnormal free fluid. IMPRESSION: Intrauterine leiomyoma in the leftward aspect of the fundus measuring 1.6 x 0.9 x 0.8 cm. Study otherwise unremarkable. Electronically Signed   By: Lowella Grip III M.D.   On: 05/05/2017 18:51   US Pelvis Complete  Result Date: 05/05/2017 CLINICAL DATA:  Pelvic pain EXAM: TRANSABDOMINAL AND TRANSVAGINAL ULTRASOUND OF PELVIS TECHNIQUE: Study was performed transabdominally to optimize pelvic field of view evaluation and transvaginally to optimize internal visceral architecture evaluation. COMPARISON:  None FINDINGS: Uterus Measurements: 9.3 x 5.0 x 5.6 cm. There is a hypoechoic mass in the leftward aspect of the uterine fundus measuring 1.6 x 0.9 x 0.8 cm, a small leiomyoma. No other intrauterine mass. Endometrium Thickness: 12 mm.  No focal abnormality visualized. Right ovary  Measurements: 4.6 x 2.9 x 1.7 cm. Normal appearance/no adnexal mass. Left ovary Measurements: 3.9 x 2.3 x 2.2 cm. Normal appearance/no adnexal mass. Other findings No abnormal free fluid. IMPRESSION: Intrauterine leiomyoma in the leftward aspect of the fundus measuring 1.6 x 0.9 x 0.8 cm. Study otherwise unremarkable. Electronically Signed   By: Lowella Grip III M.D.   On: 05/05/2017 18:51    Review of Systems  Constitutional: Negative for fever.  Gastrointestinal: Positive for abdominal pain, nausea and vomiting. Negative for constipation and diarrhea.  Genitourinary: Negative for dysuria.   Physical Exam   Blood pressure 137/80, pulse 90, temperature 98 F (36.7 C), temperature source Oral, resp. rate 18, height '5\' 1"'$  (1.549 m), weight 205 lb (93 kg), unknown if currently breastfeeding.  Physical Exam  Constitutional: She is oriented to person, place, and time. She appears well-developed and well-nourished. No distress.  Patient actively retching   GI: Soft. Normal appearance. There is tenderness in the left lower quadrant. There is no rigidity, no rebound and no guarding.  Genitourinary:  Genitourinary Comments: Bimanual exam: Cervix closed, no CMT  Uterus non tender, normal size Adnexa non tender, no masses bilaterally Chaperone present for exam.   Musculoskeletal: Normal range of motion.  Neurological: She is alert and oriented to person, place, and time.  Skin: Skin is warm. She is not diaphoretic.  Psychiatric: Her behavior is normal.    MAU Course  Procedures  None  MDM  Zofran 8 mg ODT NS bolus Toradol 60 mg IV CBC, CMP, Lipase Patient rocking in the bed Pelvic US ordered.  Discussed patient with Dr. Matthew Saras. Unlikely a GYN cause for pain.  Slightly elevated WBC and Lipase, will transfer to [facility] for further assessment, observation, and possibly a CT scan.  Discussed with Dr. Stark Jock at [facility] ED; ok for transfer.  Additional dose of Zofran IV given  prior to exit. Patient vomited ODT zofran.  Assessment and Plan   A:  1. LLQ pain   2. Nausea and vomiting   3. Abdominal pain   4. Hypokalemia   5. Leiomyoma     P:  Transfer to [facility] ED for further evaluation.    Lezlie Lye, NP 05/05/2017 7:46 PM

## 2017-05-05 NOTE — Progress Notes (Signed)
[facility] transported pt to [facility]

## 2017-05-05 NOTE — Progress Notes (Signed)
Pt for transfer to [facility], [place] notified and report given to Faith Rogue, rn

## 2017-05-06 MED ORDER — ONDANSETRON HCL 8 MG PO TABS: 8.0000 mg | ORAL_TABLET | Freq: Three times a day (TID) | ORAL | 0 refills | Status: DC | PRN

## 2017-05-06 NOTE — Discharge Instructions (Signed)
Your CT scan was negative today. Abdominal (belly) pain can be caused by many things. Your caregiver performed an examination and possibly ordered blood/urine tests and imaging (CT scan, x-rays, ultrasound). Many cases can be observed and treated at home after initial evaluation in the emergency department. Even though you are being discharged home, abdominal pain can be unpredictable. Therefore, you need a repeated exam if your pain does not resolve, returns, or worsens. Most patients with abdominal pain don't have to be admitted to the hospital or have surgery, but serious problems like appendicitis and gallbladder attacks can start out as nonspecific pain. Many abdominal conditions cannot be diagnosed in one visit, so follow-up evaluations are very important. SEEK IMMEDIATE MEDICAL ATTENTION IF: The pain does not go away or becomes severe.  A temperature above 101 develops.  Repeated vomiting occurs (multiple episodes).  The pain becomes localized to portions of the abdomen. The right side could possibly be appendicitis. In an adult, the left lower portion of the abdomen could be colitis or diverticulitis.  Blood is being passed in stools or vomit (bright red or black tarry stools).  Return also if you develop chest pain, difficulty breathing, dizziness or fainting, or become confused, poorly responsive, or inconsolable (young children).

## 2017-05-06 NOTE — ED Provider Notes (Signed)
[facility] DEPT Provider Note   CSN: [id number] Arrival date & time: 05/05/17  1632     History   Chief Complaint Chief Complaint  Patient presents with  . Abdominal Pain    HPI Tracey Beard is a 36 y.o. female with a hx of Alcohol and opiate dependence. She was seen at the [facility] earlier today for , left lower quadrant abdominal pain. Negative Pelvic US.She was sent to the ED for further work up. She states that the pain started about 2 weeks ago and has been intermittent. However, has become acutely worse today and is now constant. Patient did have 1 alcoholic beverage today. She denies being intoxicated. She denies diarrhea, constipation, vomiting. She does endorse nausea. She denies any urinary or vaginal symptoms. Patient lost a fetus and her mother this year which is what she attributes her etoh dependence to . HPI  Past Medical History:  Diagnosis Date  . Chronic back pain   . Headache   . Hypertension   . Idiopathic intracranial hypertension   . Leukocytosis 02/25/2015  . Pregnancy induced hypertension     Patient Active Problem List   Diagnosis Date Noted  . IUFD at 69 weeks or more of gestation 11/01/2016  . 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/2015  . 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 Pseudotumor cerebri 07/11/2014  . Obesity 07/11/2014  . Chronic back pain 07/11/2014  . Anxiety state 12/05/2013  . Back ache 12/05/2013  . Clinical depression 12/05/2013  . 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 Family history of breast cancer 12/05/2013    Past Surgical History:  Procedure Laterality Date  . CHOLECYSTECTOMY      OB History    Gravida Para Term Preterm AB Living   2 1   1 1  0   SAB TAB Ectopic Multiple Live Births   1               Home Medications    Prior to Admission medications   Medication Sig Start Date End Date Taking? Authorizing Provider  ALPRAZolam Duanne Moron) 1 MG tablet Take 1 mg by mouth every 8 (eight)  hours as needed for sleep. for anxiety 03/14/17  Yes [provider]  clomiPHENE (CLOMID) 50 MG tablet Take 100 mg by mouth as directed. Take 100 mg daily on days 5 through 9 05/04/17  Yes [provider]  DRYSOL 20 % external solution Apply 1 Dose topically daily as needed (Sweat).  02/06/17  Yes [provider]  gabapentin (NEURONTIN) 800 MG tablet Take 800 mg by mouth at bedtime. 04/27/17  Yes [provider]  ibuprofen (ADVIL,MOTRIN) 800 MG tablet Take 800 mg by mouth daily as needed for pain.   Yes [provider]  methocarbamol (ROBAXIN) 750 MG tablet Take 750 mg by mouth daily as needed for muscle spasms. 03/28/17  Yes [provider]  oxyCODONE (OXY IR/ROXICODONE) 5 MG immediate release tablet Take 5 mg by mouth every 8 (eight) hours as needed for pain. for pain 04/27/17  Yes [provider]  Prenatal Vit-Fe Fumarate-FA (PRENATAL MULTIVITAMIN) TABS tablet Take 1 tablet by mouth daily.   Yes [provider]  sertraline (ZOLOFT) 100 MG tablet Take 100 mg by mouth daily.   Yes [provider]  topiramate (TOPAMAX) 100 MG tablet Take 100 mg by mouth 2 (two) times daily. 03/28/17  Yes [provider]  cyclobenzaprine (FLEXERIL) 10 MG tablet Take 1 tablet (10 mg total) by mouth 3 (  three) times daily. Patient not taking: Reported on 05/05/2017 11/03/16   Juanda Chance, NP  labetalol (NORMODYNE) 200 MG tablet Take 1 tablet (200 mg total) by mouth 3 (three) times daily. Patient not taking: Reported on 05/05/2017 11/03/16   Juanda Chance, NP  sertraline (ZOLOFT) 25 MG tablet Take 1 tablet (25 mg total) by mouth daily. Patient not taking: Reported on 05/05/2017 11/03/16   Juanda Chance, NP    Family History Family History  Problem Relation Age of Onset  . Cancer Mother   . Hypertension Mother   . Hypertension Father   . Cancer Maternal Aunt   . Hypertension Maternal Aunt   . Cancer Maternal Uncle   . Hypertension  Maternal Uncle   . Hypertension Maternal Grandmother   . Hypertension Maternal Grandfather   . Cancer Paternal Grandmother     Social History Social History  Substance Use Topics  . Smoking status: Never Smoker  . Smokeless tobacco: Never Used  . Alcohol use Yes     Comment: socially     Allergies   Elavil [amitriptyline hcl]; Restoril [temazepam]; Trazodone and nefazodone; Bupivacaine; Diamox [acetazolamide]; Tomato; Hydrocodone-acetaminophen; Maxalt [rizatriptan benzoate]; Benzocaine; Caine-1 [lidocaine hcl]; Latex; and Novocain [procaine]   Review of Systems Review of Systems Ten systems reviewed and are negative for acute change, except as noted in the HPI.    Physical Exam Updated Vital Signs BP 117/71 (BP Location: Left Arm)   Pulse 71   Temp 98.5 F (36.9 C) (Oral)   Resp 19   Ht 5\' 1"  (1.549 m)   Wt 93 kg (205 lb)   SpO2 98%   BMI 38.73 kg/m   Physical Exam  Constitutional: She is oriented to person, place, and time. She appears well-developed and well-nourished. No distress.  HENT:  Head: Normocephalic and atraumatic.  Eyes: Conjunctivae are normal. No scleral icterus.  Neck: Normal range of motion.  Cardiovascular: Normal rate, regular rhythm and normal heart sounds.  Exam reveals no gallop and no friction rub.   No murmur heard. Pulmonary/Chest: Effort normal and breath sounds normal. No respiratory distress.  Abdominal: Soft. Bowel sounds are normal. She exhibits no distension and no mass. There is tenderness (LLQ). There is no guarding.  Neurological: She is alert and oriented to person, place, and time.  Skin: Skin is warm and dry. She is not diaphoretic.  Psychiatric: Her behavior is normal.  Nursing note and vitals reviewed.    ED Treatments / Results  Labs (all labs ordered are listed, but only abnormal results are displayed) Labs Reviewed  URINALYSIS, ROUTINE W REFLEX MICROSCOPIC - Abnormal; Notable for the following:       Result Value    APPearance CLOUDY (*)    Specific Gravity, Urine >1.030 (*)    All other components within normal limits  COMPREHENSIVE METABOLIC PANEL - Abnormal; Notable for the following:    Potassium 3.1 (*)    All other components within normal limits  CBC WITH DIFFERENTIAL/PLATELET - Abnormal; Notable for the following:    WBC 13.5 (*)    Neutro Abs 7.9 (*)    Lymphs Abs 4.7 (*)    All other components within normal limits  LIPASE, BLOOD - Abnormal; Notable for the following:    Lipase 65 (*)    All other components within normal limits  HCG, SERUM, QUALITATIVE  POCT PREGNANCY, URINE    EKG  EKG Interpretation None       Radiology US Transvaginal Non-ob  Result Date: 05/05/2017  CLINICAL DATA:  Pelvic pain EXAM: TRANSABDOMINAL AND TRANSVAGINAL ULTRASOUND OF PELVIS TECHNIQUE: Study was performed transabdominally to optimize pelvic field of view evaluation and transvaginally to optimize internal visceral architecture evaluation. COMPARISON:  None FINDINGS: Uterus Measurements: 9.3 x 5.0 x 5.6 cm. There is a hypoechoic mass in the leftward aspect of the uterine fundus measuring 1.6 x 0.9 x 0.8 cm, a small leiomyoma. No other intrauterine mass. Endometrium Thickness: 12 mm.  No focal abnormality visualized. Right ovary Measurements: 4.6 x 2.9 x 1.7 cm. Normal appearance/no adnexal mass. Left ovary Measurements: 3.9 x 2.3 x 2.2 cm. Normal appearance/no adnexal mass. Other findings No abnormal free fluid. IMPRESSION: Intrauterine leiomyoma in the leftward aspect of the fundus measuring 1.6 x 0.9 x 0.8 cm. Study otherwise unremarkable. Electronically Signed   By: Lowella Grip III M.D.   On: 05/05/2017 18:51   US Pelvis Complete  Result Date: 05/05/2017 CLINICAL DATA:  Pelvic pain EXAM: TRANSABDOMINAL AND TRANSVAGINAL ULTRASOUND OF PELVIS TECHNIQUE: Study was performed transabdominally to optimize pelvic field of view evaluation and transvaginally to optimize internal visceral architecture  evaluation. COMPARISON:  None FINDINGS: Uterus Measurements: 9.3 x 5.0 x 5.6 cm. There is a hypoechoic mass in the leftward aspect of the uterine fundus measuring 1.6 x 0.9 x 0.8 cm, a small leiomyoma. No other intrauterine mass. Endometrium Thickness: 12 mm.  No focal abnormality visualized. Right ovary Measurements: 4.6 x 2.9 x 1.7 cm. Normal appearance/no adnexal mass. Left ovary Measurements: 3.9 x 2.3 x 2.2 cm. Normal appearance/no adnexal mass. Other findings No abnormal free fluid. IMPRESSION: Intrauterine leiomyoma in the leftward aspect of the fundus measuring 1.6 x 0.9 x 0.8 cm. Study otherwise unremarkable. Electronically Signed   By: Lowella Grip III M.D.   On: 05/05/2017 18:51   Ct Abdomen Pelvis W Contrast  Result Date: 05/05/2017 CLINICAL DATA:  Subacute onset of left lower quadrant abdominal pain. Initial encounter. EXAM: CT ABDOMEN AND PELVIS WITH CONTRAST TECHNIQUE: Multidetector CT imaging of the abdomen and pelvis was performed using the standard protocol following bolus administration of intravenous contrast. CONTRAST:  100 mL ISOVUE-300 IOPAMIDOL (ISOVUE-300) INJECTION 61% COMPARISON:  Pelvic ultrasound performed earlier today at 5:59 p.m. FINDINGS: Lower chest: The visualized lung bases are grossly clear. The visualized portions of the mediastinum are unremarkable. Hepatobiliary: The liver is unremarkable in appearance. The patient is status post cholecystectomy, with clips noted at the gallbladder fossa. The common bile duct remains normal in caliber. Pancreas: The pancreas is within normal limits. Spleen: The spleen is unremarkable in appearance. Adrenals/Urinary Tract: The adrenal glands are unremarkable in appearance. A tiny left renal cyst is noted. There is no evidence of hydronephrosis. No renal or ureteral stones are identified. No perinephric stranding is seen. Stomach/Bowel: The stomach is unremarkable in appearance. The small bowel is within normal limits. The appendix is  normal in caliber, without evidence of appendicitis. The colon is unremarkable in appearance. Vascular/Lymphatic: The abdominal aorta is unremarkable in appearance. The inferior vena cava is grossly unremarkable. No retroperitoneal lymphadenopathy is seen. No pelvic sidewall lymphadenopathy is identified. Reproductive: The bladder is mildly distended and grossly unremarkable. The uterus is unremarkable in appearance. The ovaries are relatively symmetric. No suspicious adnexal masses are seen. Other: A metallic piercing is noted at the umbilicus. Musculoskeletal: No acute osseous abnormalities are identified. The visualized musculature is unremarkable in appearance. IMPRESSION: 1. No acute abnormality seen within the abdomen or pelvis. 2. Tiny left renal cyst. Electronically Signed   By: Francoise Schaumann.D.  On: 05/05/2017 22:59    Procedures Procedures (including critical care time)  Medications Ordered in ED Medications  0.9 %  sodium chloride infusion ( Intravenous New Bag/Given 05/05/17 2208)  ondansetron (ZOFRAN-ODT) disintegrating tablet 8 mg (8 mg Oral Given 05/05/17 1741)  ketorolac (TORADOL) 30 MG/ML injection 30 mg (30 mg Intravenous Given 05/05/17 1903)  ondansetron (ZOFRAN) injection 4 mg (4 mg Intravenous Given 05/05/17 1950)  fentaNYL (SUBLIMAZE) injection 50 mcg (50 mcg Intravenous Given 05/05/17 2208)  sodium chloride 0.9 % bolus 500 mL (0 mLs Intravenous Stopped 05/05/17 2253)  promethazine (PHENERGAN) injection 25 mg (25 mg Intravenous Given 05/05/17 2210)  iopamidol (ISOVUE-300) 61 % injection (100 mLs Intravenous Contrast Given 05/05/17 2236)     Initial Impression / Assessment and Plan / ED Course  I have reviewed the triage vital signs and the nursing notes.  Pertinent labs & imaging results that were available during my care of the patient were reviewed by me and considered in my medical decision making (see chart for details).     Patient with LLQ abdominal pain. No  abnormality on CT of the abdomen. Patient pain and nausea improved. She appears safe for discharge at this time.  Final Clinical Impressions(s) / ED Diagnoses   Final diagnoses:  Hypokalemia  LLQ pain  Leiomyoma    New Prescriptions New Prescriptions   No medications on file     Ned Grace 05/06/17 0119    Dorie Rank, MD 05/07/17 [phone]

## 2017-05-25 ENCOUNTER — Encounter: Payer: Self-pay | Admitting: Nurse Practitioner

## 2017-06-08 ENCOUNTER — Ambulatory Visit: Payer: Managed Care, Other (non HMO) | Admitting: Nurse Practitioner

## 2017-06-12 ENCOUNTER — Ambulatory Visit: Payer: Managed Care, Other (non HMO) | Admitting: Nurse Practitioner

## 2017-06-12 ENCOUNTER — Ambulatory Visit (INDEPENDENT_AMBULATORY_CARE_PROVIDER_SITE_OTHER): Payer: Managed Care, Other (non HMO) | Admitting: Gastroenterology

## 2017-06-12 ENCOUNTER — Other Ambulatory Visit (INDEPENDENT_AMBULATORY_CARE_PROVIDER_SITE_OTHER): Payer: Managed Care, Other (non HMO)

## 2017-06-12 ENCOUNTER — Encounter: Payer: Self-pay | Admitting: Gastroenterology

## 2017-06-12 VITALS — BP 116/78 | HR 80 | Ht 60.5 in | Wt 213.5 lb

## 2017-06-12 DIAGNOSIS — R1084 Generalized abdominal pain: Secondary | ICD-10-CM | POA: Diagnosis not present

## 2017-06-12 DIAGNOSIS — R112 Nausea with vomiting, unspecified: Secondary | ICD-10-CM

## 2017-06-12 DIAGNOSIS — R197 Diarrhea, unspecified: Secondary | ICD-10-CM

## 2017-06-12 LAB — BASIC METABOLIC PANEL
BUN: 9 mg/dL (ref 6–23)
CO2: 25 mEq/L (ref 19–32)
Calcium: 9.7 mg/dL (ref 8.4–10.5)
Chloride: 106 mEq/L (ref 96–112)
Creatinine, Ser: 0.73 mg/dL (ref 0.40–1.20)
GFR: 117.22 mL/min (ref >60.00)
Glucose, Bld: 106 mg/dL — ABNORMAL HIGH (ref 70–99)
Potassium: 3.5 mEq/L (ref 3.5–5.1)
Sodium: 141 mEq/L (ref 135–145)

## 2017-06-12 LAB — TSH: TSH: 2.65 u[IU]/mL (ref 0.35–4.50)

## 2017-06-12 LAB — LIPASE: Lipase: 70 U/L — ABNORMAL HIGH (ref 11.0–59.0)

## 2017-06-12 LAB — HIGH SENSITIVITY CRP: CRP, High Sensitivity: 4.49 mg/L (ref 0.000–5.000)

## 2017-06-12 LAB — SEDIMENTATION RATE: Sed Rate: 24 mm/hr — ABNORMAL HIGH (ref 0–20)

## 2017-06-12 LAB — IGA: IgA: 166 mg/dL (ref 68–378)

## 2017-06-12 MED ORDER — ONDANSETRON 4 MG PO TBDP: ORAL_TABLET | ORAL | 1 refills | Status: DC

## 2017-06-12 MED ORDER — DICYCLOMINE HCL 10 MG PO CAPS: ORAL_CAPSULE | ORAL | 2 refills | Status: DC

## 2017-06-12 MED ORDER — NA SULFATE-K SULFATE-MG SULF 17.5-3.13-1.6 GM/177ML PO SOLN: ORAL | 0 refills | Status: DC

## 2017-06-12 NOTE — Patient Instructions (Addendum)
Please go to the basement level to have your labs drawn.  We have sent the following medications to your pharmacy for you to pick up at your convenience: [facility]. 1. Zofran ODT 2. Bentyl 10 mg  You have been scheduled for a colonoscopy and endoscopy. Please follow written instructions given to you at your visit today.  Please pick up your prep supplies at the pharmacy within the next 1-3 days. If you use inhalers (even only as needed), please bring them with you on the day of your procedure. Your physician has requested that you go to www.[email or web address].com and enter the access code given to you at your visit today. This web site gives a general overview about your procedure. However, you should still follow specific instructions given to you by our office regarding your preparation for the procedure.

## 2017-06-12 NOTE — Progress Notes (Addendum)
06/12/2017 Tracey Beard [id number] 14-Oct-1980   HISTORY OF PRESENT ILLNESS:  Tracey Beard is a very pleasant 36 year old female who is new to our office.  She was actually referred here by her OB/GYN, Dr. Helane Rima, for evaluation regarding multiple GI complaints. She tells me that for several years, probably beginning when she was a teenager, she's had issues with her stomach. She reports that she did see Dr. Collene Mares in 2006 at which time she underwent colonoscopy. She recalls that being normal. Today she complains of diarrhea that she describes as watery stools every day, sometimes up to 11 times per day. She denies seeing blood in her stools, however.  She complains of constantly feeling nauseous and having intermittent episodes of vomiting. She says that she just never feels good. She complains of diffuse abdominal cramping.  She had a CT scan of the abdomen and pelvis with contrast performed in August that showed only a tiny left renal cyst.  Her white blood cell count was elevated at 13.5, but this has been persistently elevated for at least the past year and 13.5 is actually the lowest that it has been. Hemoglobin and platelets normal. LFTs are normal. BMP was normal except for a slightly low potassium at 3.1. Of note, her lipase was slightly elevated at 65, but once again no pancreatic abnormalities were seen on CT scan.  She has been on Nexium 40 mg daily for about 2 years.  She feels that the Nexium works very well for her reflux and has not experienced any issues with that.  She is very concerned about the elevated lipase since her mother recently passed with pancreatic cancer.  Just of note, patient had an intrauterine fetal demise at 7 months gestation in February of this year. She is still very emotional about this, understandably.   Past Medical History:  Diagnosis Date  . Anxiety   . Chronic back pain   . Depression   . Headache   . Hypertension   . Idiopathic intracranial  hypertension   . Leukocytosis 02/25/2015  . Pregnancy induced hypertension    Past Surgical History:  Procedure Laterality Date  . CHOLECYSTECTOMY      reports that she has never smoked. She has never used smokeless tobacco. She reports that she drinks alcohol. She reports that she does not use drugs. family history includes Breast cancer in her maternal aunt and mother; Colon cancer in her paternal grandmother; Diabetes in her maternal aunt, maternal grandfather, and mother; Heart disease in her maternal grandmother and paternal grandfather; Hypertension in her father, maternal aunt, maternal grandfather, maternal grandmother, maternal uncle, and mother; Liver cancer in her maternal uncle; Pancreatic cancer in her mother; Prostate cancer in her maternal uncle. Allergies  Allergen Reactions  . Elavil [Amitriptyline Hcl] Shortness Of Breath  . Restoril [Temazepam] Shortness Of Breath  . Trazodone And Nefazodone Anaphylaxis  . Bupivacaine Swelling  . Diamox [Acetazolamide] Other (See Comments)    Reaction:  Cold chills/loss of taste   . Tomato Swelling    (Lips)  . Hydrocodone-Acetaminophen Itching  . Maxalt [Rizatriptan Benzoate] Other (See Comments)    Reaction:  Chest tightness   . Benzocaine Swelling, Rash and Other (See Comments)    Reaction:  Localized swelling  . Caine-1 [Lidocaine Hcl] Swelling, Rash and Other (See Comments)    Reaction:  Localized swelling  . Latex Rash  . Novocain [Procaine] Swelling, Rash and Other (See Comments)    Reaction:  Localized swelling  Outpatient Encounter Prescriptions as of 06/12/2017  Medication Sig  . clomiPHENE (CLOMID) 50 MG tablet Take 100 mg by mouth as directed. Take 100 mg daily on days 5 through 9  . DRYSOL 20 % external solution Apply 1 Dose topically daily as needed (Sweat).   Marland Kitchen esomeprazole (NEXIUM) 40 MG capsule Take 40 mg by mouth daily.  Marland Kitchen gabapentin (NEURONTIN) 800 MG tablet Take 800 mg by mouth at bedtime.  Marland Kitchen ibuprofen  (ADVIL,MOTRIN) 800 MG tablet Take 800 mg by mouth daily as needed for pain.  Marland Kitchen labetalol (NORMODYNE) 200 MG tablet Take 1 tablet (200 mg total) by mouth 3 (three) times daily. (Patient taking differently: Take 200 mg by mouth as needed. )  . metFORMIN (GLUCOPHAGE) 500 MG tablet Take 500 mg by mouth 2 (two) times daily.  . methocarbamol (ROBAXIN) 750 MG tablet Take 750 mg by mouth daily as needed for muscle spasms.  . ondansetron (ZOFRAN) 8 MG tablet Take 1 tablet (8 mg total) by mouth every 8 (eight) hours as needed for nausea or vomiting.  Marland Kitchen oxyCODONE (OXY IR/ROXICODONE) 5 MG immediate release tablet Take 5 mg by mouth every 8 (eight) hours as needed for pain. for pain  . Prenatal Vit-Fe Fumarate-FA (PRENATAL MULTIVITAMIN) TABS tablet Take 1 tablet by mouth daily.  . sertraline (ZOLOFT) 100 MG tablet Take 150 mg by mouth daily.   Marland Kitchen topiramate (TOPAMAX) 100 MG tablet Take 100 mg by mouth 2 (two) times daily.  . [DISCONTINUED] ALPRAZolam (XANAX) 1 MG tablet Take 1 mg by mouth every 8 (eight) hours as needed for sleep. for anxiety  . [DISCONTINUED] cyclobenzaprine (FLEXERIL) 10 MG tablet Take 1 tablet (10 mg total) by mouth 3 (three) times daily. (Patient not taking: Reported on 05/05/2017)  . [DISCONTINUED] gabapentin (NEURONTIN) 800 MG tablet Take 800 mg by mouth 3 (three) times daily.  . [DISCONTINUED] sertraline (ZOLOFT) 25 MG tablet Take 1 tablet (25 mg total) by mouth daily. (Patient not taking: Reported on 05/05/2017)   No facility-administered encounter medications on file as of 06/12/2017.      REVIEW OF SYSTEMS  : All other systems reviewed and negative except where noted in the History of Present Illness.   PHYSICAL EXAM: BP 116/78 (BP Location: Left Arm, Patient Position: Sitting, Cuff Size: Normal)   Pulse 80   Ht 5' 0.5" (1.537 m) Comment: height measured without shoes  Wt 213 lb 8 oz (96.8 kg)   LMP 05/12/2017   Breastfeeding? No   BMI 41.01 kg/m  General: Well developed black  female in no acute distress Head: Normocephalic and atraumatic Eyes:  Sclerae anicteric, conjunctiva pink. Ears: Normal auditory acuity Lungs: Clear throughout to auscultation; no increased WOB. Heart: Regular rate and rhythm; no M/R/G. Abdomen: Soft, non-distended.  BS present.  Mild diffuse TTP. Rectal:  Will be done at the time of colonoscopy. Musculoskeletal: Symmetrical with no gross deformities  Skin: No lesions on visible extremities Extremities: No edema  Neurological: Alert oriented x 4, grossly non-focal Psychological:  Alert and cooperative. Normal mood and affect  ASSESSMENT AND PLAN: *36 year old female with complaints of diarrhea, nausea, intermittent vomiting, generalized abdominal pain. I think that the fact her symptoms have been present for such a long time and reportedly had a negative colonoscopy several years ago that her symptoms are likely secondary to irritable bowel syndrome. Recently had a very traumatic event with intrauterine fetal demise at 10 months earlier this year. Mother also recently passed away from pancreatic cancer.  Recent CT  scan was unremarkable, which is reassuring.  She did have a very minimally elevated lipase, but CT scan of the pancreas is unremarkable. This may be her baseline or it could have been slightly elevated due to nausea/vomiting.  Patient is very concerned about her symptoms as she just feels bad all the time. We will schedule her for EGD and colonoscopy for completeness to rule out Crohn's disease, etc., however I think that other diagnoses are much less likely.  We will check labs including a repeat BMP and lipase. We'll also check a CRP, sedimentation rate, TSH, celiac labs. I'll prescribe Zofran to use as needed for nausea and I would like her to try Bentyl 10 mg twice a day for her abdominal cramping.  **The risks, benefits, and alternatives to EGD and colonoscopy were discussed with the patient and she consents to proceed.    CC:   Bernerd Limbo, MD CC:  Dr. Helane Rima  Addendum: Reviewed and agree with initial management. Pyrtle, Lajuan Lines, MD

## 2017-06-14 ENCOUNTER — Encounter: Payer: Self-pay | Admitting: Internal Medicine

## 2017-06-14 LAB — TISSUE TRANSGLUTAMINASE, IGG: (tTG) Ab, IgG: 2 U/mL

## 2017-06-18 ENCOUNTER — Encounter: Payer: Self-pay | Admitting: Gastroenterology

## 2017-06-18 DIAGNOSIS — R1084 Generalized abdominal pain: Secondary | ICD-10-CM | POA: Insufficient documentation

## 2017-06-18 DIAGNOSIS — R112 Nausea with vomiting, unspecified: Secondary | ICD-10-CM | POA: Insufficient documentation

## 2017-06-18 DIAGNOSIS — R197 Diarrhea, unspecified: Secondary | ICD-10-CM | POA: Insufficient documentation

## 2017-06-21 ENCOUNTER — Ambulatory Visit (AMBULATORY_SURGERY_CENTER): Payer: Managed Care, Other (non HMO) | Admitting: Internal Medicine

## 2017-06-21 ENCOUNTER — Encounter: Payer: Self-pay | Admitting: Internal Medicine

## 2017-06-21 VITALS — BP 163/87 | HR 55 | Temp 98.4°F | Resp 17 | Ht 60.0 in | Wt 213.0 lb

## 2017-06-21 DIAGNOSIS — R112 Nausea with vomiting, unspecified: Secondary | ICD-10-CM | POA: Diagnosis present

## 2017-06-21 DIAGNOSIS — R197 Diarrhea, unspecified: Secondary | ICD-10-CM | POA: Diagnosis not present

## 2017-06-21 DIAGNOSIS — R1084 Generalized abdominal pain: Secondary | ICD-10-CM

## 2017-06-21 MED ORDER — METOCLOPRAMIDE HCL 5 MG PO TABS: 5.0000 mg | ORAL_TABLET | Freq: Three times a day (TID) | ORAL | 12 refills | Status: DC

## 2017-06-21 MED ORDER — SODIUM CHLORIDE 0.9 % IV SOLN: 500.0000 mL | INTRAVENOUS | Status: DC

## 2017-06-21 NOTE — Patient Instructions (Signed)
Discharge instructions given. Handout on a Gastroparesis diet. Prescription sent to pharmacy. Resume previous medications. Office will call and schedule follow up office visit for 4-6 weeks. YOU HAD AN ENDOSCOPIC PROCEDURE TODAY AT [facility]:   Refer to the procedure report that was given to you for any specific questions about what was found during the examination.  If the procedure report does not answer your questions, please call your gastroenterologist to clarify.  If you requested that your care partner not be given the details of your procedure findings, then the procedure report has been included in a sealed envelope for you to review at your convenience later.  YOU SHOULD EXPECT: Some feelings of bloating in the abdomen. Passage of more gas than usual.  Walking can help get rid of the air that was put into your GI tract during the procedure and reduce the bloating. If you had a lower endoscopy (such as a colonoscopy or flexible sigmoidoscopy) you may notice spotting of blood in your stool or on the toilet paper. If you underwent a bowel prep for your procedure, you may not have a normal bowel movement for a few days.  Please Note:  You might notice some irritation and congestion in your nose or some drainage.  This is from the oxygen used during your procedure.  There is no need for concern and it should clear up in a day or so.  SYMPTOMS TO REPORT IMMEDIATELY:   Following lower endoscopy (colonoscopy or flexible sigmoidoscopy):  Excessive amounts of blood in the stool  Significant tenderness or worsening of abdominal pains  Swelling of the abdomen that is new, acute  Fever of 100F or higher   Following upper endoscopy (EGD)  Vomiting of blood or coffee ground material  New chest pain or pain under the shoulder blades  Painful or persistently difficult swallowing  New shortness of breath  Fever of 100F or higher  Black, tarry-looking stools  For urgent or  emergent issues, a gastroenterologist can be reached at any hour by calling [phone].   DIET:  We do recommend a small meal at first, but then you may proceed to your regular diet.  Drink plenty of fluids but you should avoid alcoholic beverages for 24 hours.  ACTIVITY:  You should plan to take it easy for the rest of today and you should NOT DRIVE or use heavy machinery until tomorrow (because of the sedation medicines used during the test).    FOLLOW UP: Our staff will call the number listed on your records the next business day following your procedure to check on you and address any questions or concerns that you may have regarding the information given to you following your procedure. If we do not reach you, we will leave a message.  However, if you are feeling well and you are not experiencing any problems, there is no need to return our call.  We will assume that you have returned to your regular daily activities without incident.  If any biopsies were taken you will be contacted by phone or by letter within the next 1-3 weeks.  Please call us at [phone] if you have not heard about the biopsies in 3 weeks.    SIGNATURES/CONFIDENTIALITY: You and/or your care partner have signed paperwork which will be entered into your electronic medical record.  These signatures attest to the fact that that the information above on your After Visit Summary has been reviewed and is understood.  Full responsibility of the confidentiality of this discharge information lies with you and/or your care-partner.

## 2017-06-21 NOTE — Op Note (Signed)
[facility] Patient Name: Tracey Beard Procedure Date: 06/21/2017 9:11 AM MRN: [id number] Endoscopist: Jerene Bears , MD Age: 36 Referring MD:  Date of Birth: 1981-05-10 Gender: Female Account #: [id number] Procedure:                Colonoscopy Indications:              Generalized abdominal pain, Clinically significant                            diarrhea of unexplained origin Medicines:                Monitored Anesthesia Care Procedure:                Pre-Anesthesia Assessment:                           - Prior to the procedure, a History and Physical                            was performed, and patient medications and                            allergies were reviewed. The patient's tolerance of                            previous anesthesia was also reviewed. The risks                            and benefits of the procedure and the sedation                            options and risks were discussed with the patient.                            All questions were answered, and informed consent                            was obtained. Prior Anticoagulants: The patient has                            taken no previous anticoagulant or antiplatelet                            agents. ASA Grade Assessment: II - A patient with                            mild systemic disease. After reviewing the risks                            and benefits, the patient was deemed in                            satisfactory condition to undergo the procedure.  After obtaining informed consent, the colonoscope                            was passed under direct vision. Throughout the                            procedure, the patient's blood pressure, pulse, and                            oxygen saturations were monitored continuously. The                            Colonoscope was introduced through the anus and                            advanced to the the terminal  ileum. The colonoscopy                            was performed without difficulty. The patient                            tolerated the procedure well. The quality of the                            bowel preparation was good. The terminal ileum,                            ileocecal valve, appendiceal orifice, and rectum                            were photographed. Scope In: 9:31:30 AM Scope Out: 9:41:11 AM Scope Withdrawal Time: 0 hours 7 minutes 23 seconds  Total Procedure Duration: 0 hours 9 minutes 41 seconds  Findings:                 The digital rectal exam was normal.                           The terminal ileum appeared normal.                           The entire examined colon appeared normal on direct                            and retroflexion views.                           Biopsies for histology were taken with a cold                            forceps from the right colon and left colon for                            evaluation of microscopic colitis. Complications:  No immediate complications. Estimated Blood Loss:     Estimated blood loss was minimal. Impression:               - The examined portion of the ileum was normal.                           - The entire examined colon is normal on direct and                            retroflexion views.                           - Biopsies were taken with a cold forceps from the                            right colon and left colon for evaluation of                            microscopic colitis. Recommendation:           - Patient has a contact number available for                            emergencies. The signs and symptoms of potential                            delayed complications were discussed with the                            patient. Return to normal activities tomorrow.                            Written discharge instructions were provided to the                            patient.                            - Resume previous diet.                           - Continue present medications.                           - Await pathology results.                           - Repeat colonoscopy at age 83 for screening                            purposes.                           - See the other procedure note for documentation of  additional recommendations. Jerene Bears, MD 06/21/2017 9:54:22 AM This report has been signed electronically.

## 2017-06-21 NOTE — Op Note (Signed)
[facility] Patient Name: Tracey Beard Procedure Date: 06/21/2017 9:11 AM MRN: [id number] Endoscopist: Jerene Bears , MD Age: 36 Referring MD:  Date of Birth: 08/30/1981 Gender: Female Account #: [id number] Procedure:                Upper GI endoscopy Indications:              Generalized abdominal pain, Diarrhea, Nausea with                            vomiting Medicines:                Monitored Anesthesia Care Procedure:                Pre-Anesthesia Assessment:                           - Prior to the procedure, a History and Physical                            was performed, and patient medications and                            allergies were reviewed. The patient's tolerance of                            previous anesthesia was also reviewed. The risks                            and benefits of the procedure and the sedation                            options and risks were discussed with the patient.                            All questions were answered, and informed consent                            was obtained. Prior Anticoagulants: The patient has                            taken no previous anticoagulant or antiplatelet                            agents. ASA Grade Assessment: II - A patient with                            mild systemic disease. After reviewing the risks                            and benefits, the patient was deemed in                            satisfactory condition to undergo the procedure.  After obtaining informed consent, the endoscope was                            passed under direct vision. Throughout the                            procedure, the patient's blood pressure, pulse, and                            oxygen saturations were monitored continuously. The                            Endoscope was introduced through the mouth, and                            advanced to the second part of duodenum. The upper                             GI endoscopy was accomplished without difficulty.                            The patient tolerated the procedure well. Scope In: Scope Out: Findings:                 The examined esophagus was normal. Z-line regular                            at 36 cm from the incisors.                           The cardia and gastric fundus were normal on                            retroflexion.                           A large amount of food (residue) was found in the                            gastric body and in the gastric antrum.                           Suspect gastroparesis due to retained gastric                            contents.                           Normal mucosa was found in the entire examined                            stomach, though visualization of the entire gastric                            mucosa limited by retained food.. Biopsies were  taken with a cold forceps for histology and                            Helicobacter pylori testing (gastric body, antrum,                            incisura).                           The examined duodenum was normal. Complications:            No immediate complications. Estimated Blood Loss:     Estimated blood loss was minimal. Impression:               - Normal esophagus.                           - A large amount of food (residue) in the stomach                            consistent with gastroparesis.                           - Normal mucosa was found in the entire stomach.                            Biopsied.                           - Normal examined duodenum. Recommendation:           - Patient has a contact number available for                            emergencies. The signs and symptoms of potential                            delayed complications were discussed with the                            patient. Return to normal activities tomorrow.                             Written discharge instructions were provided to the                            patient.                           - Gastroparesis diet.                           - Continue present medications.                           - Begin metoclopramide 5 mg three times daily  before meals and at bedtime. Dose can be titrated                            as needed.                           - Await pathology results.                           - Office follow-up in 4-6 weeks with me or Alonza Bogus, PA-C. Jerene Bears, MD 06/21/2017 9:51:54 AM This report has been signed electronically.

## 2017-06-21 NOTE — Progress Notes (Signed)
Report to PACU, RN, vss, BBS= Clear.  

## 2017-06-21 NOTE — Progress Notes (Signed)
Called to room to assist during endoscopic procedure.  Patient ID and intended procedure confirmed with present staff. Received instructions for my participation in the procedure from the performing physician.  

## 2017-06-21 NOTE — Progress Notes (Signed)
Pt left nose piercing and belly piercing in and CRNA,J Monday made aware

## 2017-06-21 NOTE — Progress Notes (Signed)
Blood Glucose not taken in recovery room patient states not a Diabetic on medication for other cause.

## 2017-06-22 ENCOUNTER — Telehealth: Payer: Self-pay | Admitting: *Deleted

## 2017-06-22 NOTE — Telephone Encounter (Signed)
Unable to leave message mailbox is full.

## 2017-06-22 NOTE — Telephone Encounter (Signed)
  Follow up Call-  Call back number 06/21/2017  Post procedure Call Back phone  # [phone]  Permission to leave phone message Yes  Some recent data might be hidden     Patient questions:  Message left to call us if necessary.

## 2017-06-29 ENCOUNTER — Encounter: Payer: Self-pay | Admitting: Internal Medicine

## 2017-07-02 ENCOUNTER — Telehealth: Payer: Self-pay | Admitting: Internal Medicine

## 2017-07-02 ENCOUNTER — Other Ambulatory Visit: Payer: Self-pay

## 2017-07-02 DIAGNOSIS — B839 Helminthiasis, unspecified: Secondary | ICD-10-CM

## 2017-07-02 NOTE — Telephone Encounter (Signed)
Could have been mucus but for completeness would check GI pathogen panel which will screen for some parasites but also send stool for ova and parasite examination

## 2017-07-02 NOTE — Telephone Encounter (Signed)
Pt reports she had a BM this am and states she thinks she saw a gross, nice sized worm in the stool. Pt states she doesn't know if she thinks she saw it and there really isn't anything there. Let pt know I would check with the doctor, that sometimes stool studies are ordered. Dr. Hilarie Fredrickson please advise.

## 2017-07-02 NOTE — Telephone Encounter (Signed)
Spoke with pt and she is aware, orders in epic. 

## 2017-07-25 ENCOUNTER — Ambulatory Visit: Payer: Managed Care, Other (non HMO) | Admitting: Gastroenterology

## 2017-07-25 ENCOUNTER — Encounter: Payer: Self-pay | Admitting: Gastroenterology

## 2017-07-25 VITALS — BP 130/98 | Wt 223.4 lb

## 2017-07-25 DIAGNOSIS — R1084 Generalized abdominal pain: Secondary | ICD-10-CM | POA: Diagnosis not present

## 2017-07-25 DIAGNOSIS — K591 Functional diarrhea: Secondary | ICD-10-CM | POA: Diagnosis not present

## 2017-07-25 DIAGNOSIS — F411 Generalized anxiety disorder: Secondary | ICD-10-CM

## 2017-07-25 DIAGNOSIS — R112 Nausea with vomiting, unspecified: Secondary | ICD-10-CM | POA: Diagnosis not present

## 2017-07-25 DIAGNOSIS — K3184 Gastroparesis: Secondary | ICD-10-CM | POA: Insufficient documentation

## 2017-07-25 MED ORDER — DICYCLOMINE HCL 10 MG PO CAPS: ORAL_CAPSULE | ORAL | 1 refills | Status: DC

## 2017-07-25 NOTE — Progress Notes (Addendum)
07/25/2017 Tracey Beard [id number] 04-20-1981   HISTORY OF PRESENT ILLNESS: This is a 36 year old female who is here for follow-up of her complaints of abdominal pain, nausea, diarrhea.  She underwent both EGD and colonoscopy by Dr. Hilarie Fredrickson in October.  Colonoscopy was normal with normal random biopsies.  Endoscopy showed a large of amount of food residue in her stomach consistent with gastroparesis.  Study was otherwise normal.  She was prescribed to take Reglan 5 mg 4 times daily and increase to 10 mg daily if needed.  She says that she did not realize that the Reglan pill she received was 10 mg tablets so she started out right from the beginning with 10 mg 4 times daily (did not cut them in half).  She is taking Bentyl 20 mg daily says that it did seem to help some with the nausea, but she thought that it made her diarrhea worse.  She is also on metformin 500 mg just once daily as she cannot tolerate even higher doses due to diarrhea.  She is very tearful and emotional at her visit today.  She says that she just does not feel well and has a lot going on.  Had a tough session at therapy yesterday.  She is taking Bentyl 10 mg twice daily, but is still getting abdominal cramping.  Is on Nexium 40 mg daily.  Recent CT scan and extensive labs unremarkable.   Past Medical History:  Diagnosis Date  . Anxiety   . Chronic back pain   . Depression   . Headache   . Hypertension   . Idiopathic intracranial hypertension   . Leukocytosis 02/25/2015  . Pregnancy induced hypertension    Past Surgical History:  Procedure Laterality Date  . CHOLECYSTECTOMY      reports that  has never smoked. she has never used smokeless tobacco. She reports that she drinks alcohol. She reports that she does not use drugs. family history includes Breast cancer in her maternal aunt and mother; Colon cancer in her paternal grandmother; Diabetes in her maternal aunt, maternal grandfather, and mother; Heart disease in her  maternal grandmother and paternal grandfather; Hypertension in her father, maternal aunt, maternal grandfather, maternal grandmother, maternal uncle, and mother; Liver cancer in her maternal uncle; Pancreatic cancer in her mother; Prostate cancer in her maternal uncle. Allergies  Allergen Reactions  . Elavil [Amitriptyline Hcl] Shortness Of Breath  . Restoril [Temazepam] Shortness Of Breath  . Trazodone And Nefazodone Anaphylaxis  . Bupivacaine Swelling  . Diamox [Acetazolamide] Other (See Comments)    Reaction:  Cold chills/loss of taste   . Tomato Swelling    (Lips)  . Hydrocodone-Acetaminophen Itching  . Maxalt [Rizatriptan Benzoate] Other (See Comments)    Reaction:  Chest tightness   . Benzocaine Swelling, Rash and Other (See Comments)    Reaction:  Localized swelling  . Caine-1 [Lidocaine Hcl] Swelling, Rash and Other (See Comments)    Reaction:  Localized swelling  . Latex Rash  . Novocain [Procaine] Swelling, Rash and Other (See Comments)    Reaction:  Localized swelling      Outpatient Encounter Medications as of 07/25/2017  Medication Sig  . clomiPHENE (CLOMID) 50 MG tablet Take 100 mg by mouth as directed. Take 100 mg daily on days 5 through 9  . dicyclomine (BENTYL) 10 MG capsule Take 1 tab twice daily.  . DRYSOL 20 % external solution Apply 1 Dose topically daily as needed (Sweat).   Marland Kitchen esomeprazole ([place])  40 MG capsule Take 40 mg by mouth daily.  Marland Kitchen gabapentin (NEURONTIN) 800 MG tablet Take 800 mg by mouth at bedtime.  Marland Kitchen ibuprofen (ADVIL,MOTRIN) 800 MG tablet Take 800 mg by mouth daily as needed for pain.  Marland Kitchen labetalol (NORMODYNE) 200 MG tablet Take 1 tablet (200 mg total) by mouth 3 (three) times daily.  . metFORMIN (GLUCOPHAGE) 500 MG tablet Take 500 mg by mouth 2 (two) times daily.  . methocarbamol (ROBAXIN) 750 MG tablet Take 750 mg by mouth daily as needed for muscle spasms.  . metoCLOPramide (REGLAN) 5 MG tablet Take 1 tablet (5 mg total) by mouth 4 (four) times  daily -  before meals and at bedtime.  . Na Sulfate-K Sulfate-Mg Sulf 17.5-3.13-1.6 GM/180ML SOLN Take as directed for colonoscopy.  . ondansetron (ZOFRAN) 8 MG tablet Take 1 tablet (8 mg total) by mouth every 8 (eight) hours as needed for nausea or vomiting.  Marland Kitchen oxyCODONE (OXY IR/ROXICODONE) 5 MG immediate release tablet Take 5 mg by mouth every 8 (eight) hours as needed for pain. for pain  . Prenatal Vit-Fe Fumarate-FA (PRENATAL MULTIVITAMIN) TABS tablet Take 1 tablet by mouth daily.  . sertraline (ZOLOFT) 100 MG tablet Take 150 mg by mouth daily.   Marland Kitchen topiramate (TOPAMAX) 100 MG tablet Take 100 mg by mouth 2 (two) times daily.  . [DISCONTINUED] 0.9 %  sodium chloride infusion    No facility-administered encounter medications on file as of 07/25/2017.      REVIEW OF SYSTEMS  : All other systems reviewed and negative except where noted in the History of Present Illness.   PHYSICAL EXAM: BP (!) 130/98   Wt 223 lb 6 oz (101.3 kg)   BMI 43.62 kg/m  General: Well developed black female in no acute distress; very tearful Head: Normocephalic and atraumatic Eyes:  Sclerae anicteric, conjunctiva pink. Ears: Normal auditory acuity Lungs: Clear throughout to auscultation; no increased WOB. Heart: Regular rate and rhythm; no M/R/G. Abdomen: Soft, non-distended.  BS present.  Non-tender. Musculoskeletal: Symmetrical with no gross deformities  Skin: No lesions on visible extremities Extremities: No edema  Neurological: Alert oriented x 4, grossly non-focal Psychological:  Alert and cooperative. Normal mood and affect  ASSESSMENT AND PLAN: *36 year old female with complaints of diarrhea, nausea, intermittent vomiting, generalized abdominal pain.  She does likely have gastroparesis with retained food in her stomach on EGD.  Otherwise EGD, colonoscopy, extensive labs, CT scan have all been unremarkable.  I think that she very likely has IBS.  She has been very emotional and under a lot of stress  after losing her mother to pancreatic cancer and a baby at 7 months gestation earlier this year.  She says that she does see 2 therapists, but the only medication that she is on for all of this is Zoloft, and she does not think that the increased dose is working.  She is very tearful and emotional at her visit.  I think that all of this is very likely playing into her symptoms.  Metformin may be contributing to diarrhea as well.  She started out right from the beginning with Reglan 10 mg 4 times a day and thought that it worsened her diarrhea, but did seem to help her nausea.  I have asked her to decrease to 5 mg four times daily and see how she does with that.  I am going to increase her Bentyl to 20 mg twice daily.  She can use Zofran alternating with Reglan to help with  the nausea if needed.  I will have her follow-up here in approximately 4 weeks.  May need to try to switch to domperidone if Reglan seems to help.   CC:  Bartholome Bill, MD   Addendum: Reviewed and agree with management. Would consider behavior health referral at follow-up if felt this would be helpful. Pyrtle, Lajuan Lines, MD

## 2017-07-25 NOTE — Patient Instructions (Addendum)
We have sent the following medications to your pharmacy for you to pick up at your convenience: [place] 1. Bentyl 20 mg tablets  Take Reglan 5 mg with meals and at bedtime.   Alternate between Reglan and Zofran for nausea.   We made you an appointment with Janett Billow on 08-29-2017 at 10:00 am. Arrive at 9:45 am.

## 2017-07-31 ENCOUNTER — Other Ambulatory Visit: Payer: Self-pay | Admitting: Gastroenterology

## 2017-08-08 ENCOUNTER — Other Ambulatory Visit: Payer: Self-pay | Admitting: Gastroenterology

## 2017-08-29 ENCOUNTER — Ambulatory Visit: Payer: Managed Care, Other (non HMO) | Admitting: Gastroenterology

## 2017-09-05 DIAGNOSIS — F4321 Adjustment disorder with depressed mood: Secondary | ICD-10-CM | POA: Insufficient documentation

## 2018-04-19 ENCOUNTER — Encounter (HOSPITAL_COMMUNITY): Payer: Self-pay | Admitting: *Deleted

## 2018-04-19 ENCOUNTER — Emergency Department (HOSPITAL_COMMUNITY)
Admission: AD | Admit: 2018-04-19 | Discharge: 2018-04-20 | Disposition: A | Discharge status: Home / Self Care | Payer: Self-pay | Source: Ambulatory Visit | Attending: Emergency Medicine | Admitting: Emergency Medicine

## 2018-04-19 DIAGNOSIS — R519 Headache, unspecified: Secondary | ICD-10-CM

## 2018-04-19 DIAGNOSIS — R079 Chest pain, unspecified: Secondary | ICD-10-CM | POA: Insufficient documentation

## 2018-04-19 DIAGNOSIS — I10 Essential (primary) hypertension: Secondary | ICD-10-CM | POA: Insufficient documentation

## 2018-04-19 DIAGNOSIS — R51 Headache: Secondary | ICD-10-CM | POA: Insufficient documentation

## 2018-04-19 DIAGNOSIS — Z79899 Other long term (current) drug therapy: Secondary | ICD-10-CM | POA: Insufficient documentation

## 2018-04-19 DIAGNOSIS — Z91040 Latex allergy status: Secondary | ICD-10-CM | POA: Insufficient documentation

## 2018-04-19 NOTE — MAU Note (Signed)
Pt stated she had an episode at work last week when she had blurred vison, very bad headache and abd pain,shortness of breath and her b/p was 171/101. Did not see anybody for it just has been monitoring it herself. Started having some more chest pain 2 days ago.  Chest Pain is not so bad tonight. Still having a migraine like headache and nausea.Took 800 mg of Ibuprofen without relief.

## 2018-04-20 ENCOUNTER — Emergency Department (HOSPITAL_COMMUNITY): Payer: Self-pay

## 2018-04-20 LAB — CBC WITH DIFFERENTIAL/PLATELET
Abs Immature Granulocytes: 0 10*3/uL (ref 0.0–0.1)
Basophils Absolute: 0 10*3/uL (ref 0.0–0.1)
Basophils Relative: 1 %
Eosinophils Absolute: 0.1 10*3/uL (ref 0.0–0.7)
Eosinophils Relative: 1 %
HCT: 36.9 % (ref 36.0–46.0)
Hemoglobin: 12 g/dL (ref 12.0–15.0)
Immature Granulocytes: 0 %
Lymphocytes Relative: 42 %
Lymphs Abs: 3.2 10*3/uL (ref 0.7–4.0)
MCH: 30.7 pg (ref 26.0–34.0)
MCHC: 32.5 g/dL (ref 30.0–36.0)
MCV: 94.4 fL (ref 78.0–100.0)
Monocytes Absolute: 0.8 10*3/uL (ref 0.1–1.0)
Monocytes Relative: 10 %
Neutro Abs: 3.7 10*3/uL (ref 1.7–7.7)
Neutrophils Relative %: 46 %
Platelets: 304 10*3/uL (ref 150–400)
RBC: 3.91 MIL/uL (ref 3.87–5.11)
RDW: 13.2 % (ref 11.5–15.5)
WBC: 7.8 10*3/uL (ref 4.0–10.5)

## 2018-04-20 LAB — BASIC METABOLIC PANEL
Anion gap: 11 (ref 5–15)
BUN: 12 mg/dL (ref 6–20)
CO2: 26 mmol/L (ref 22–32)
Calcium: 9.2 mg/dL (ref 8.9–10.3)
Chloride: 105 mmol/L (ref 98–111)
Creatinine, Ser: 0.84 mg/dL (ref 0.44–1.00)
GFR calc Af Amer: >60 mL/min (ref >60)
GFR calc non Af Amer: >60 mL/min (ref >60)
Glucose, Bld: 116 mg/dL — ABNORMAL HIGH (ref 70–99)
Potassium: 4 mmol/L (ref 3.5–5.1)
Sodium: 142 mmol/L (ref 135–145)

## 2018-04-20 LAB — I-STAT TROPONIN, ED: Troponin i, poc: 0.01 ng/mL (ref 0.00–0.08)

## 2018-04-20 MED ORDER — DEXAMETHASONE SODIUM PHOSPHATE 10 MG/ML IJ SOLN
10.0000 mg | Freq: Once | INTRAMUSCULAR | Status: AC
  Administered 2018-04-20: 10 mg via INTRAVENOUS
  Filled 2018-04-20: qty 1

## 2018-04-20 MED ORDER — ONDANSETRON HCL 4 MG/2ML IJ SOLN
4.0000 mg | Freq: Once | INTRAMUSCULAR | Status: AC
  Administered 2018-04-20: 4 mg via INTRAVENOUS
  Filled 2018-04-20: qty 2

## 2018-04-20 MED ORDER — KETOROLAC TROMETHAMINE 30 MG/ML IJ SOLN: 15.0000 mg | Freq: Once | INTRAMUSCULAR | Status: DC

## 2018-04-20 MED ORDER — ACETAMINOPHEN 325 MG PO TABS
650.0000 mg | ORAL_TABLET | Freq: Once | ORAL | Status: AC
  Administered 2018-04-20: 650 mg via ORAL
  Filled 2018-04-20: qty 2

## 2018-04-20 MED ORDER — METOCLOPRAMIDE HCL 5 MG/ML IJ SOLN
10.0000 mg | Freq: Once | INTRAMUSCULAR | Status: AC
Start: 2018-04-20 — End: 2018-04-20
  Administered 2018-04-20: 10 mg via INTRAVENOUS
  Filled 2018-04-20: qty 2

## 2018-04-20 MED ORDER — IBUPROFEN 800 MG PO TABS
800.0000 mg | ORAL_TABLET | Freq: Once | ORAL | Status: AC
  Administered 2018-04-20: 800 mg via ORAL
  Filled 2018-04-20: qty 1

## 2018-04-20 MED ORDER — SODIUM CHLORIDE 0.9 % IV BOLUS
500.0000 mL | Freq: Once | INTRAVENOUS | Status: AC
  Administered 2018-04-20: 500 mL via INTRAVENOUS

## 2018-04-20 MED ORDER — DIPHENHYDRAMINE HCL 50 MG/ML IJ SOLN
12.5000 mg | Freq: Once | INTRAMUSCULAR | Status: AC
  Administered 2018-04-20: 12.5 mg via INTRAVENOUS
  Filled 2018-04-20: qty 1
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
## 2018-04-20 NOTE — ED Notes (Signed)
Patient is a transfer from [facility].

## 2018-04-20 NOTE — MAU Note (Signed)
Pt stated she has not taken a lot of her regular medications due to insurance complication.
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
## 2018-07-26 LAB — OB RESULTS CONSOLE ANTIBODY SCREEN: Antibody Screen: NEGATIVE

## 2018-07-26 LAB — OB RESULTS CONSOLE RPR: RPR: NONREACTIVE

## 2018-07-26 LAB — OB RESULTS CONSOLE GC/CHLAMYDIA
Chlamydia: NEGATIVE
Gonorrhea: NEGATIVE

## 2018-07-26 LAB — OB RESULTS CONSOLE HIV ANTIBODY (ROUTINE TESTING): HIV: NONREACTIVE

## 2018-07-26 LAB — OB RESULTS CONSOLE ABO/RH: RH Type: POSITIVE

## 2018-07-26 LAB — OB RESULTS CONSOLE HEPATITIS B SURFACE ANTIGEN: Hepatitis B Surface Ag: NEGATIVE

## 2018-07-26 LAB — OB RESULTS CONSOLE RUBELLA ANTIBODY, IGM: Rubella: IMMUNE

## 2018-12-04 ENCOUNTER — Other Ambulatory Visit: Payer: Self-pay

## 2018-12-04 ENCOUNTER — Encounter: Payer: BLUE CROSS/BLUE SHIELD | Attending: Family Medicine | Admitting: Registered"

## 2018-12-04 ENCOUNTER — Ambulatory Visit: Payer: BLUE CROSS/BLUE SHIELD

## 2018-12-04 DIAGNOSIS — O99810 Abnormal glucose complicating pregnancy: Secondary | ICD-10-CM | POA: Diagnosis present

## 2018-12-06 ENCOUNTER — Encounter: Payer: Self-pay | Admitting: Registered"

## 2018-12-06 DIAGNOSIS — O99810 Abnormal glucose complicating pregnancy: Secondary | ICD-10-CM | POA: Insufficient documentation

## 2018-12-06 NOTE — Progress Notes (Signed)
Patient was seen on 12/04/2018 for Gestational Diabetes self-management class at the [facility]. The following learning objectives were met by the patient during this course:   States the definition of Gestational Diabetes  States why dietary management is important in controlling blood glucose  Describes the effects each nutrient has on blood glucose levels  Demonstrates ability to create a balanced meal plan  Demonstrates carbohydrate counting   States when to check blood glucose levels  Demonstrates proper blood glucose monitoring techniques  States the effect of stress and exercise on blood glucose levels  States the importance of limiting caffeine and abstaining from alcohol and smoking  Blood glucose monitor given: [employer] Lot # [id number] x Exp: 05/19/19 Blood glucose reading: 132 mg/dL  Patient instructed to monitor glucose levels: FBS: 60 - <95; 1 hour: <140; 2 hour: <120  Patient received handouts:  Nutrition Diabetes and Pregnancy, including carb counting list  Patient will be seen for follow-up as needed.

## 2019-01-13 LAB — OB RESULTS CONSOLE GBS: GBS: POSITIVE

## 2019-01-21 ENCOUNTER — Encounter (HOSPITAL_COMMUNITY): Payer: Self-pay | Admitting: *Deleted

## 2019-01-21 ENCOUNTER — Other Ambulatory Visit (HOSPITAL_COMMUNITY): Payer: Self-pay | Admitting: *Deleted

## 2019-01-21 ENCOUNTER — Telehealth (HOSPITAL_COMMUNITY): Payer: Self-pay | Admitting: *Deleted

## 2019-01-21 ENCOUNTER — Other Ambulatory Visit: Payer: Self-pay | Admitting: Advanced Practice Midwife

## 2019-01-21 NOTE — Telephone Encounter (Signed)
Preadmission screen covid appt

## 2019-01-21 NOTE — Telephone Encounter (Signed)
Preadmission screen  

## 2019-01-26 MED ORDER — FENTANYL-BUPIVACAINE-NACL 0.5-0.125-0.9 MG/250ML-% EP SOLN
EPIDURAL | Status: AC
  Filled 2019-01-26: qty 250

## 2019-01-27 ENCOUNTER — Other Ambulatory Visit (HOSPITAL_COMMUNITY): Admission: RE | Admit: 2019-01-27 | Payer: BLUE CROSS/BLUE SHIELD | Source: Ambulatory Visit

## 2019-01-27 ENCOUNTER — Other Ambulatory Visit: Payer: Self-pay

## 2019-01-27 ENCOUNTER — Other Ambulatory Visit (HOSPITAL_COMMUNITY)
Admission: RE | Admit: 2019-01-27 | Discharge: 2019-01-27 | Disposition: A | Discharge status: Home / Self Care | Payer: BLUE CROSS/BLUE SHIELD | Source: Ambulatory Visit | Attending: Obstetrics and Gynecology | Admitting: Obstetrics and Gynecology

## 2019-01-27 DIAGNOSIS — Z1159 Encounter for screening for other viral diseases: Secondary | ICD-10-CM | POA: Insufficient documentation

## 2019-01-27 NOTE — MAU Note (Signed)
Pt swabbed for COVID, no symptoms. Specimen sent to lab.

## 2019-01-28 ENCOUNTER — Other Ambulatory Visit: Payer: Self-pay | Admitting: Obstetrics and Gynecology

## 2019-01-28 ENCOUNTER — Other Ambulatory Visit (HOSPITAL_COMMUNITY): Payer: Self-pay | Admitting: *Deleted

## 2019-01-28 LAB — NOVEL CORONAVIRUS, NAA (HOSP ORDER, SEND-OUT TO REF LAB; TAT 18-24 HRS): SARS-CoV-2, NAA: NOT DETECTED

## 2019-01-29 ENCOUNTER — Inpatient Hospital Stay (HOSPITAL_COMMUNITY)
Admission: AD | Admit: 2019-01-29 | Discharge: 2019-02-02 | DRG: 787 | Disposition: A | Discharge status: Home / Self Care | Payer: BLUE CROSS/BLUE SHIELD | Attending: Obstetrics & Gynecology | Admitting: Obstetrics & Gynecology

## 2019-01-29 ENCOUNTER — Encounter (HOSPITAL_COMMUNITY)
Admission: AD | Disposition: A | Discharge status: Home / Self Care | Payer: Self-pay | Source: Home / Self Care | Attending: Obstetrics & Gynecology

## 2019-01-29 ENCOUNTER — Encounter (HOSPITAL_COMMUNITY): Payer: Self-pay

## 2019-01-29 ENCOUNTER — Other Ambulatory Visit: Payer: Self-pay

## 2019-01-29 ENCOUNTER — Inpatient Hospital Stay (HOSPITAL_COMMUNITY): Payer: BLUE CROSS/BLUE SHIELD

## 2019-01-29 ENCOUNTER — Inpatient Hospital Stay (HOSPITAL_COMMUNITY): Payer: BLUE CROSS/BLUE SHIELD | Admitting: Anesthesiology

## 2019-01-29 DIAGNOSIS — O1002 Pre-existing essential hypertension complicating childbirth: Secondary | ICD-10-CM | POA: Diagnosis present

## 2019-01-29 DIAGNOSIS — Z3A37 37 weeks gestation of pregnancy: Secondary | ICD-10-CM | POA: Diagnosis not present

## 2019-01-29 DIAGNOSIS — O99824 Streptococcus B carrier state complicating childbirth: Secondary | ICD-10-CM | POA: Diagnosis present

## 2019-01-29 DIAGNOSIS — O24425 Gestational diabetes mellitus in childbirth, controlled by oral hypoglycemic drugs: Principal | ICD-10-CM | POA: Diagnosis present

## 2019-01-29 DIAGNOSIS — Z98891 History of uterine scar from previous surgery: Secondary | ICD-10-CM

## 2019-01-29 DIAGNOSIS — O139 Gestational [pregnancy-induced] hypertension without significant proteinuria, unspecified trimester: Secondary | ICD-10-CM | POA: Diagnosis present

## 2019-01-29 LAB — CBC
HCT: 33.6 % — ABNORMAL LOW (ref 36.0–46.0)
Hemoglobin: 10.9 g/dL — ABNORMAL LOW (ref 12.0–15.0)
MCH: 27.4 pg (ref 26.0–34.0)
MCHC: 32.4 g/dL (ref 30.0–36.0)
MCV: 84.4 fL (ref 80.0–100.0)
Platelets: 242 10*3/uL (ref 150–400)
RBC: 3.98 MIL/uL (ref 3.87–5.11)
RDW: 14.7 % (ref 11.5–15.5)
WBC: 11.2 10*3/uL — ABNORMAL HIGH (ref 4.0–10.5)
nRBC: 0 % (ref 0.0–0.2)

## 2019-01-29 LAB — COMPREHENSIVE METABOLIC PANEL
ALT: 16 U/L (ref 0–44)
AST: 29 U/L (ref 15–41)
Albumin: 3.1 g/dL — ABNORMAL LOW (ref 3.5–5.0)
Alkaline Phosphatase: 162 U/L — ABNORMAL HIGH (ref 38–126)
Anion gap: 11 (ref 5–15)
BUN: 10 mg/dL (ref 6–20)
CO2: 20 mmol/L — ABNORMAL LOW (ref 22–32)
Calcium: 9.5 mg/dL (ref 8.9–10.3)
Chloride: 105 mmol/L (ref 98–111)
Creatinine, Ser: 0.78 mg/dL (ref 0.44–1.00)
GFR calc Af Amer: >60 mL/min (ref >60)
GFR calc non Af Amer: >60 mL/min (ref >60)
Glucose, Bld: 115 mg/dL — ABNORMAL HIGH (ref 70–99)
Potassium: 4.4 mmol/L (ref 3.5–5.1)
Sodium: 136 mmol/L (ref 135–145)
Total Bilirubin: 0.6 mg/dL (ref 0.3–1.2)
Total Protein: 6.8 g/dL (ref 6.5–8.1)

## 2019-01-29 LAB — RPR: RPR Ser Ql: NONREACTIVE

## 2019-01-29 LAB — TYPE AND SCREEN
ABO/RH(D): AB POS
Antibody Screen: NEGATIVE

## 2019-01-29 LAB — GLUCOSE, CAPILLARY
Glucose-Capillary: 73 mg/dL (ref 70–99)
Glucose-Capillary: 85 mg/dL (ref 70–99)
Glucose-Capillary: 72 mg/dL (ref 70–99)
Glucose-Capillary: 74 mg/dL (ref 70–99)

## 2019-01-29 LAB — ABO/RH: ABO/RH(D): AB POS

## 2019-01-29 SURGERY — Surgical Case
Anesthesia: Epidural

## 2019-01-29 MED ORDER — LIDOCAINE HCL (PF) 1 % IJ SOLN
INTRAMUSCULAR | Status: DC | PRN
  Administered 2019-01-29: 5 mL via EPIDURAL

## 2019-01-29 MED ORDER — PHENYLEPHRINE HCL (PRESSORS) 10 MG/ML IV SOLN
INTRAVENOUS | Status: DC | PRN
  Administered 2019-01-29 (×2): 40 ug via INTRAVENOUS

## 2019-01-29 MED ORDER — ACETAMINOPHEN 500 MG PO TABS
1000.0000 mg | ORAL_TABLET | Freq: Once | ORAL | Status: AC
  Administered 2019-01-29: 02:00:00 1000 mg via ORAL
  Filled 2019-01-29: qty 2

## 2019-01-29 MED ORDER — PENICILLIN G 3 MILLION UNITS IVPB - SIMPLE MED
3.0000 10*6.[IU] | INTRAVENOUS | Status: DC
  Filled 2019-01-29 (×3): qty 100

## 2019-01-29 MED ORDER — DIPHENHYDRAMINE HCL 25 MG PO CAPS: 25.0000 mg | ORAL_CAPSULE | Freq: Four times a day (QID) | ORAL | Status: DC | PRN

## 2019-01-29 MED ORDER — IBUPROFEN 800 MG PO TABS
800.0000 mg | ORAL_TABLET | Freq: Four times a day (QID) | ORAL | Status: DC | PRN
  Administered 2019-01-30 – 2019-02-02 (×13): 800 mg via ORAL
  Filled 2019-01-29 (×13): qty 1

## 2019-01-29 MED ORDER — DIPHENHYDRAMINE HCL 50 MG/ML IJ SOLN
INTRAMUSCULAR | Status: AC
  Filled 2019-01-29: qty 1

## 2019-01-29 MED ORDER — TERBUTALINE SULFATE 1 MG/ML IJ SOLN
0.2500 mg | Freq: Once | INTRAMUSCULAR | Status: AC | PRN
  Administered 2019-01-29: 05:00:00 0.25 mg via SUBCUTANEOUS
  Filled 2019-01-29: qty 1

## 2019-01-29 MED ORDER — SODIUM CHLORIDE 0.9% FLUSH: 3.0000 mL | INTRAVENOUS | Status: DC | PRN

## 2019-01-29 MED ORDER — DEXAMETHASONE SODIUM PHOSPHATE 10 MG/ML IJ SOLN
INTRAMUSCULAR | Status: AC
  Filled 2019-01-29: qty 1

## 2019-01-29 MED ORDER — ONDANSETRON HCL 4 MG/2ML IJ SOLN
INTRAMUSCULAR | Status: AC
  Filled 2019-01-29: qty 2

## 2019-01-29 MED ORDER — PHENYLEPHRINE 40 MCG/ML (10ML) SYRINGE FOR IV PUSH (FOR BLOOD PRESSURE SUPPORT): 80.0000 ug | PREFILLED_SYRINGE | INTRAVENOUS | Status: DC | PRN

## 2019-01-29 MED ORDER — LIDOCAINE HCL (PF) 1 % IJ SOLN: 30.0000 mL | INTRAMUSCULAR | Status: DC | PRN

## 2019-01-29 MED ORDER — EPHEDRINE 5 MG/ML INJ: 10.0000 mg | INTRAVENOUS | Status: DC | PRN

## 2019-01-29 MED ORDER — KETOROLAC TROMETHAMINE 30 MG/ML IJ SOLN
30.0000 mg | Freq: Four times a day (QID) | INTRAMUSCULAR | Status: AC | PRN
  Administered 2019-01-29: 07:00:00 30 mg via INTRAVENOUS

## 2019-01-29 MED ORDER — SCOPOLAMINE 1 MG/3DAYS TD PT72
MEDICATED_PATCH | TRANSDERMAL | Status: AC
  Filled 2019-01-29: qty 1

## 2019-01-29 MED ORDER — NALOXONE HCL 0.4 MG/ML IJ SOLN: 0.4000 mg | INTRAMUSCULAR | Status: DC | PRN

## 2019-01-29 MED ORDER — ONDANSETRON HCL 4 MG/2ML IJ SOLN: 4.0000 mg | Freq: Once | INTRAMUSCULAR | Status: DC | PRN

## 2019-01-29 MED ORDER — OXYTOCIN 40 UNITS IN NORMAL SALINE INFUSION - SIMPLE MED: 1.0000 m[IU]/min | INTRAVENOUS | Status: DC

## 2019-01-29 MED ORDER — HYDROCODONE-ACETAMINOPHEN 7.5-325 MG PO TABS: 1.0000 | ORAL_TABLET | Freq: Once | ORAL | Status: DC | PRN

## 2019-01-29 MED ORDER — LIDOCAINE VISCOUS HCL 2 % MT SOLN
15.0000 mL | Freq: Once | OROMUCOSAL | Status: DC
  Filled 2019-01-29: qty 15

## 2019-01-29 MED ORDER — ONDANSETRON HCL 4 MG/2ML IJ SOLN
INTRAMUSCULAR | Status: DC | PRN
  Administered 2019-01-29: 4 mg via INTRAVENOUS

## 2019-01-29 MED ORDER — SOD CITRATE-CITRIC ACID 500-334 MG/5ML PO SOLN
30.0000 mL | ORAL | Status: DC | PRN
  Administered 2019-01-29: 05:00:00 30 mL via ORAL
  Filled 2019-01-29: qty 30

## 2019-01-29 MED ORDER — ZOLPIDEM TARTRATE 5 MG PO TABS: 5.0000 mg | ORAL_TABLET | Freq: Every evening | ORAL | Status: DC | PRN

## 2019-01-29 MED ORDER — PHENYLEPHRINE 40 MCG/ML (10ML) SYRINGE FOR IV PUSH (FOR BLOOD PRESSURE SUPPORT)
PREFILLED_SYRINGE | INTRAVENOUS | Status: AC
  Filled 2019-01-29: qty 10

## 2019-01-29 MED ORDER — OXYCODONE-ACETAMINOPHEN 5-325 MG PO TABS
1.0000 | ORAL_TABLET | ORAL | Status: DC | PRN
  Administered 2019-01-31 – 2019-02-02 (×9): 2 via ORAL
  Filled 2019-01-29 (×9): qty 2

## 2019-01-29 MED ORDER — DIPHENHYDRAMINE HCL 50 MG/ML IJ SOLN
12.5000 mg | INTRAMUSCULAR | Status: DC | PRN
  Administered 2019-01-29: 08:00:00 12.5 mg via INTRAVENOUS

## 2019-01-29 MED ORDER — ACETAMINOPHEN 325 MG PO TABS: 650.0000 mg | ORAL_TABLET | ORAL | Status: DC | PRN

## 2019-01-29 MED ORDER — OXYTOCIN 40 UNITS IN NORMAL SALINE INFUSION - SIMPLE MED
INTRAVENOUS | Status: AC
  Filled 2019-01-29: qty 1000

## 2019-01-29 MED ORDER — PRENATAL MULTIVITAMIN CH
1.0000 | ORAL_TABLET | Freq: Every day | ORAL | Status: DC
  Administered 2019-01-29 – 2019-02-02 (×5): 1 via ORAL
  Filled 2019-01-29 (×5): qty 1

## 2019-01-29 MED ORDER — SODIUM CHLORIDE 0.9 % IV SOLN
5.0000 10*6.[IU] | Freq: Once | INTRAVENOUS | Status: AC
  Administered 2019-01-29: 02:00:00 5 10*6.[IU] via INTRAVENOUS
  Filled 2019-01-29: qty 5

## 2019-01-29 MED ORDER — DIPHENHYDRAMINE HCL 25 MG PO CAPS
25.0000 mg | ORAL_CAPSULE | ORAL | Status: DC | PRN
  Administered 2019-01-29 – 2019-01-30 (×3): 25 mg via ORAL
  Filled 2019-01-29 (×3): qty 1

## 2019-01-29 MED ORDER — LACTATED RINGERS IV SOLN
500.0000 mL | INTRAVENOUS | Status: DC | PRN
  Administered 2019-01-29: 02:00:00 500 mL via INTRAVENOUS

## 2019-01-29 MED ORDER — LABETALOL HCL 200 MG PO TABS
200.0000 mg | ORAL_TABLET | Freq: Two times a day (BID) | ORAL | Status: DC
  Administered 2019-01-29: 12:00:00 200 mg via ORAL
  Filled 2019-01-29 (×5): qty 1

## 2019-01-29 MED ORDER — KETOROLAC TROMETHAMINE 30 MG/ML IJ SOLN
INTRAMUSCULAR | Status: AC
  Filled 2019-01-29: qty 1

## 2019-01-29 MED ORDER — TETANUS-DIPHTH-ACELL PERTUSSIS 5-2.5-18.5 LF-MCG/0.5 IM SUSP: 0.5000 mL | Freq: Once | INTRAMUSCULAR | Status: DC

## 2019-01-29 MED ORDER — ONDANSETRON HCL 4 MG/2ML IJ SOLN: 4.0000 mg | Freq: Four times a day (QID) | INTRAMUSCULAR | Status: DC | PRN

## 2019-01-29 MED ORDER — GLYBURIDE 2.5 MG PO TABS
2.5000 mg | ORAL_TABLET | Freq: Every day | ORAL | Status: DC
  Administered 2019-01-29: 23:00:00 2.5 mg via ORAL
  Filled 2019-01-29: qty 1

## 2019-01-29 MED ORDER — MORPHINE SULFATE (PF) 0.5 MG/ML IJ SOLN
INTRAMUSCULAR | Status: DC | PRN
  Administered 2019-01-29: 2.5 mg via EPIDURAL

## 2019-01-29 MED ORDER — COCONUT OIL OIL
1.0000 "application " | TOPICAL_OIL | Status: DC | PRN
  Administered 2019-01-29: 1 via TOPICAL

## 2019-01-29 MED ORDER — SCOPOLAMINE 1 MG/3DAYS TD PT72
1.0000 | MEDICATED_PATCH | Freq: Once | TRANSDERMAL | Status: AC
  Administered 2019-01-29: 1.5 mg via TRANSDERMAL

## 2019-01-29 MED ORDER — NALBUPHINE HCL 10 MG/ML IJ SOLN
5.0000 mg | Freq: Once | INTRAMUSCULAR | Status: AC | PRN
  Administered 2019-01-29: 08:00:00 5 mg via INTRAVENOUS

## 2019-01-29 MED ORDER — FENTANYL CITRATE (PF) 100 MCG/2ML IJ SOLN
INTRAMUSCULAR | Status: AC
  Filled 2019-01-29: qty 2

## 2019-01-29 MED ORDER — NALOXONE HCL 4 MG/10ML IJ SOLN
1.0000 ug/kg/h | INTRAVENOUS | Status: DC | PRN
  Filled 2019-01-29: qty 5

## 2019-01-29 MED ORDER — MEPERIDINE HCL 25 MG/ML IJ SOLN: 6.2500 mg | INTRAMUSCULAR | Status: DC | PRN

## 2019-01-29 MED ORDER — CEFAZOLIN SODIUM-DEXTROSE 2-4 GM/100ML-% IV SOLN: 2.0000 g | Freq: Once | INTRAVENOUS | Status: DC

## 2019-01-29 MED ORDER — ONDANSETRON HCL 4 MG/2ML IJ SOLN: 4.0000 mg | Freq: Three times a day (TID) | INTRAMUSCULAR | Status: DC | PRN

## 2019-01-29 MED ORDER — LACTATED RINGERS IV SOLN
INTRAVENOUS | Status: DC
  Administered 2019-01-29 (×3): via INTRAVENOUS

## 2019-01-29 MED ORDER — SIMETHICONE 80 MG PO CHEW
80.0000 mg | CHEWABLE_TABLET | Freq: Three times a day (TID) | ORAL | Status: DC
  Administered 2019-01-29 – 2019-02-02 (×13): 80 mg via ORAL
  Filled 2019-01-29 (×11): qty 1

## 2019-01-29 MED ORDER — MENTHOL 3 MG MT LOZG
1.0000 | LOZENGE | OROMUCOSAL | Status: DC | PRN
  Administered 2019-01-31: 06:00:00 3 mg via ORAL
  Filled 2019-01-29: qty 9

## 2019-01-29 MED ORDER — LACTATED RINGERS IV SOLN: 500.0000 mL | Freq: Once | INTRAVENOUS | Status: DC

## 2019-01-29 MED ORDER — MORPHINE SULFATE (PF) 0.5 MG/ML IJ SOLN
INTRAMUSCULAR | Status: AC
  Filled 2019-01-29: qty 10

## 2019-01-29 MED ORDER — SODIUM CHLORIDE 0.9 % IV SOLN
INTRAVENOUS | Status: DC | PRN
  Administered 2019-01-29: 06:00:00 40 [IU] via INTRAVENOUS

## 2019-01-29 MED ORDER — KETOROLAC TROMETHAMINE 30 MG/ML IJ SOLN: 30.0000 mg | Freq: Four times a day (QID) | INTRAMUSCULAR | Status: AC | PRN

## 2019-01-29 MED ORDER — FENTANYL-BUPIVACAINE-NACL 0.5-0.125-0.9 MG/250ML-% EP SOLN: 12.0000 mL/h | EPIDURAL | Status: DC | PRN

## 2019-01-29 MED ORDER — OXYTOCIN BOLUS FROM INFUSION: 500.0000 mL | Freq: Once | INTRAVENOUS | Status: DC

## 2019-01-29 MED ORDER — OXYCODONE-ACETAMINOPHEN 5-325 MG PO TABS: 1.0000 | ORAL_TABLET | ORAL | Status: DC | PRN

## 2019-01-29 MED ORDER — NALBUPHINE HCL 10 MG/ML IJ SOLN: 5.0000 mg | Freq: Once | INTRAMUSCULAR | Status: AC | PRN

## 2019-01-29 MED ORDER — CEFAZOLIN SODIUM-DEXTROSE 2-4 GM/100ML-% IV SOLN
INTRAVENOUS | Status: AC
  Filled 2019-01-29: qty 100

## 2019-01-29 MED ORDER — NALBUPHINE HCL 10 MG/ML IJ SOLN
INTRAMUSCULAR | Status: AC
  Filled 2019-01-29: qty 1

## 2019-01-29 MED ORDER — OXYTOCIN 40 UNITS IN NORMAL SALINE INFUSION - SIMPLE MED
2.5000 [IU]/h | INTRAVENOUS | Status: AC
  Administered 2019-01-29: 17:00:00 2.5 [IU]/h via INTRAVENOUS

## 2019-01-29 MED ORDER — HYDROMORPHONE HCL 1 MG/ML IJ SOLN: 0.2500 mg | INTRAMUSCULAR | Status: DC | PRN

## 2019-01-29 MED ORDER — FENTANYL CITRATE (PF) 100 MCG/2ML IJ SOLN
INTRAMUSCULAR | Status: DC | PRN
  Administered 2019-01-29: 100 ug via EPIDURAL

## 2019-01-29 MED ORDER — SODIUM CHLORIDE 0.9 % IV SOLN
INTRAVENOUS | Status: DC | PRN
  Administered 2019-01-29: 06:00:00 via INTRAVENOUS

## 2019-01-29 MED ORDER — SIMETHICONE 80 MG PO CHEW
80.0000 mg | CHEWABLE_TABLET | ORAL | Status: DC | PRN
  Filled 2019-01-29: qty 1

## 2019-01-29 MED ORDER — WITCH HAZEL-GLYCERIN EX PADS: 1.0000 "application " | MEDICATED_PAD | CUTANEOUS | Status: DC | PRN

## 2019-01-29 MED ORDER — SIMETHICONE 80 MG PO CHEW
80.0000 mg | CHEWABLE_TABLET | ORAL | Status: DC
  Administered 2019-01-29 – 2019-02-01 (×4): 80 mg via ORAL
  Filled 2019-01-29 (×4): qty 1

## 2019-01-29 MED ORDER — MISOPROSTOL 25 MCG QUARTER TABLET
25.0000 ug | ORAL_TABLET | ORAL | Status: DC | PRN
  Administered 2019-01-29: 01:00:00 25 ug via VAGINAL
  Filled 2019-01-29: qty 1

## 2019-01-29 MED ORDER — OXYCODONE-ACETAMINOPHEN 5-325 MG PO TABS: 2.0000 | ORAL_TABLET | ORAL | Status: DC | PRN

## 2019-01-29 MED ORDER — ACETAMINOPHEN 500 MG PO TABS
1000.0000 mg | ORAL_TABLET | Freq: Four times a day (QID) | ORAL | Status: DC
  Administered 2019-01-29 – 2019-01-30 (×5): 1000 mg via ORAL
  Filled 2019-01-29 (×8): qty 2

## 2019-01-29 MED ORDER — LACTATED RINGERS IV SOLN
INTRAVENOUS | Status: DC
  Administered 2019-01-29: 17:00:00 via INTRAVENOUS

## 2019-01-29 MED ORDER — OXYTOCIN 40 UNITS IN NORMAL SALINE INFUSION - SIMPLE MED: 2.5000 [IU]/h | INTRAVENOUS | Status: DC

## 2019-01-29 MED ORDER — DIBUCAINE (PERIANAL) 1 % EX OINT: 1.0000 "application " | TOPICAL_OINTMENT | CUTANEOUS | Status: DC | PRN

## 2019-01-29 MED ORDER — BUPIVACAINE HCL (PF) 0.5 % IJ SOLN
INTRAMUSCULAR | Status: DC | PRN
  Administered 2019-01-29: 2 mL
  Administered 2019-01-29: 3 mL
  Administered 2019-01-29: 2 mL
  Administered 2019-01-29: 5 mL

## 2019-01-29 MED ORDER — SENNOSIDES-DOCUSATE SODIUM 8.6-50 MG PO TABS
2.0000 | ORAL_TABLET | ORAL | Status: DC
  Administered 2019-01-29 – 2019-02-01 (×3): 2 via ORAL
  Filled 2019-01-29 (×4): qty 2

## 2019-01-29 MED ORDER — SODIUM CHLORIDE 0.9 % IR SOLN
Status: DC | PRN
  Administered 2019-01-29 (×2): 1

## 2019-01-29 MED ORDER — NALBUPHINE HCL 10 MG/ML IJ SOLN
5.0000 mg | INTRAMUSCULAR | Status: DC | PRN
  Administered 2019-01-30 (×2): 5 mg via SUBCUTANEOUS
  Filled 2019-01-29 (×2): qty 1

## 2019-01-29 MED ORDER — NALBUPHINE HCL 10 MG/ML IJ SOLN
5.0000 mg | INTRAMUSCULAR | Status: DC | PRN
  Administered 2019-01-29 – 2019-01-30 (×2): 5 mg via INTRAVENOUS
  Filled 2019-01-29 (×2): qty 1

## 2019-01-29 SURGICAL SUPPLY — 36 items
ADH SKN CLS APL DERMABOND .7 (GAUZE/BANDAGES/DRESSINGS)
BENZOIN TINCTURE PRP APPL 2/3 (GAUZE/BANDAGES/DRESSINGS) ×3 IMPLANT
CHLORAPREP W/TINT 26ML (MISCELLANEOUS) ×3 IMPLANT
CLAMP CORD UMBIL (MISCELLANEOUS) IMPLANT
CLOSURE WOUND 1/2 X4 (GAUZE/BANDAGES/DRESSINGS) ×1
CLOTH BEACON ORANGE TIMEOUT ST (SAFETY) ×3 IMPLANT
DERMABOND ADVANCED (GAUZE/BANDAGES/DRESSINGS)
DERMABOND ADVANCED .7 DNX12 (GAUZE/BANDAGES/DRESSINGS) IMPLANT
DRSG OPSITE POSTOP 4X10 (GAUZE/BANDAGES/DRESSINGS) ×3 IMPLANT
ELECT REM PT RETURN 9FT ADLT (ELECTROSURGICAL) ×3
ELECTRODE REM PT RTRN 9FT ADLT (ELECTROSURGICAL) ×1 IMPLANT
EXTRACTOR VACUUM KIWI (MISCELLANEOUS) ×3 IMPLANT
GLOVE BIO SURGEON STRL SZ 6 (GLOVE) ×3 IMPLANT
GLOVE BIOGEL PI IND STRL 6 (GLOVE) ×2 IMPLANT
GLOVE BIOGEL PI IND STRL 7.0 (GLOVE) ×1 IMPLANT
GLOVE BIOGEL PI INDICATOR 6 (GLOVE) ×4
GLOVE BIOGEL PI INDICATOR 7.0 (GLOVE) ×2
GOWN STRL REUS W/TWL LRG LVL3 (GOWN DISPOSABLE) ×6 IMPLANT
KIT ABG SYR 3ML LUER SLIP (SYRINGE) ×3 IMPLANT
NEEDLE HYPO 25X5/8 SAFETYGLIDE (NEEDLE) ×3 IMPLANT
NS IRRIG 1000ML POUR BTL (IV SOLUTION) ×3 IMPLANT
PACK C SECTION WH (CUSTOM PROCEDURE TRAY) ×3 IMPLANT
PAD OB MATERNITY 4.3X12.25 (PERSONAL CARE ITEMS) ×3 IMPLANT
PENCIL SMOKE EVAC W/HOLSTER (ELECTROSURGICAL) ×3 IMPLANT
SET BERKELEY SUCTION TUBING (SUCTIONS) ×3 IMPLANT
STRIP CLOSURE SKIN 1/2X4 (GAUZE/BANDAGES/DRESSINGS) ×2 IMPLANT
SUT CHROMIC 0 CTX 36 (SUTURE) ×9 IMPLANT
SUT MON AB 2-0 CT1 27 (SUTURE) ×3 IMPLANT
SUT PDS AB 0 CT1 27 (SUTURE) IMPLANT
SUT PLAIN 0 NONE (SUTURE) IMPLANT
SUT VIC AB 0 CT1 36 (SUTURE) IMPLANT
SUT VIC AB 4-0 KS 27 (SUTURE) IMPLANT
TOWEL OR 17X24 6PK STRL BLUE (TOWEL DISPOSABLE) ×3 IMPLANT
TRAY FOLEY W/BAG SLVR 14FR LF (SET/KITS/TRAYS/PACK) IMPLANT
WATER STERILE IRR 1000ML POUR (IV SOLUTION) ×6 IMPLANT
YANKAUER SUCT BULB TIP NO VENT (SUCTIONS) ×3 IMPLANT

## 2019-01-29 NOTE — Lactation Note (Signed)
This note was copied from a baby's chart. Lactation Consultation Note  Patient Name: Tracey Beard [id number] Date: 01/29/2019 Reason for consult: Initial assessment;Primapara;1st time breastfeeding;Early term 37-38.6wks;Other (Comment)(baby was transferred to NICU due to low blood sugar  prior to mom being transferred to Couplet care at 70 )  Mom has Hypertension ( on Labetalol and GDM - Glyberide ) - P1  LC set up the DEBP and checked the flange size and the #24 F was comfortable for mom while pumping for 15 -20 mins,  No EBM yield noted with pumping or hand expressing. LC reassured mom that is normal and it takes time for let down to occur. Settings on the DEBP reviewed and LC recommended when pumping allow her breast to hang natural.  LC recommended and encouraged mom to hand express and then pump both breast 8-10 times a day to increase let down.  [place] reassured mom once the babies blood sugar is consistently regulated the NICU will have a feeding plan,  In the mean time pump / save milk and the baby will be able to have it.  LC reviewed cleaning of the DEBP / storage of the EBM and instructed mom on the use shells between feedings except when Sleeping.  Mom receptive to teaching  and review the DEBP.      Maternal Data Has patient been taught Hand Expression?: Yes(LC reviewed and was unable to express any colostrum ) Does the patient have breastfeeding experience prior to this delivery?: No  Feeding    LATCH Score                   Interventions Interventions: Breast feeding basics reviewed;DEBP;Coconut oil;Shells  Lactation Tools Discussed/Used Tools: Pump;Flanges;Shells(LC instructed mom on the use shells / DEBP and checked flange ) Flange Size: 24(was a good fit both breast ) Shell Type: Inverted Breast pump type: Double-Electric Breast Pump;Manual WIC Program: No Pump Review: Setup, frequency, and cleaning;Milk Storage Initiated by:: MAI  Date initiated::  01/29/19   Consult Status Consult Status: Follow-up Date: 01/30/19 Follow-up type: [place] 01/29/2019, 4:41 PM

## 2019-01-29 NOTE — Anesthesia Procedure Notes (Signed)
Epidural Patient location during procedure: OB Start time: 01/29/2019 1:40 AM End time: 01/29/2019 1:55 AM  Staffing Anesthesiologist: Barnet Glasgow, MD Performed: anesthesiologist   Preanesthetic Checklist Completed: patient identified, site marked, surgical consent, pre-op evaluation, timeout performed, IV checked, risks and benefits discussed and monitors and equipment checked  Epidural Patient position: sitting Prep: site prepped and draped and DuraPrep Patient monitoring: continuous pulse ox and blood pressure Approach: midline Location: L2-L3 Injection technique: LOR air  Needle:  Needle type: Tuohy  Needle gauge: 17 G Needle length: 9 cm and 9 Needle insertion depth: 6 cm Catheter type: closed end flexible Catheter size: 19 Gauge Catheter at skin depth: 12 cm Test dose: negative  Assessment Events: blood not aspirated, injection not painful, no injection resistance, negative IV test and no paresthesia  Additional Notes Patient identified. Risks/Benefits/Options discussed with patient including but not limited to bleeding, infection, nerve damage, paralysis, failed block, incomplete pain control, headache, blood pressure changes, nausea, vomiting, reactions to medication both or allergic, itching and postpartum back pain. Confirmed with bedside nurse the patient's most recent platelet count. Confirmed with patient that they are not currently taking any anticoagulation, have any bleeding history or any family history of bleeding disorders. Patient expressed understanding and wished to proceed. All questions were answered. Sterile technique was used throughout the entire procedure. Please see nursing notes for vital signs. Test dose was given through epidural needle and negative prior to continuing to dose epidural or start infusion. Warning signs of high block given to the patient including shortness of breath, tingling/numbness in hands, complete motor block, or any  concerning symptoms with instructions to call for help. Patient was given instructions on fall risk and not to get out of bed. All questions and concerns addressed with instructions to call with any issues. 2 Attempt (S): 1st attempt L3-4  + CSF , 2nd attempt L2-3 LOR air  . Patient tolerated procedure well.

## 2019-01-29 NOTE — Transfer of Care (Signed)
Immediate Anesthesia Transfer of Care Note  Patient: Tracey Beard  Procedure(s) Performed: CESAREAN SECTION (N/A )  Patient Location: PACU  Anesthesia Type:Epidural  Level of Consciousness: awake, alert , oriented and patient cooperative  Airway & Oxygen Therapy: Patient Spontanous Breathing  Post-op Assessment: Report given to RN and Post -op Vital signs reviewed and stable  Post vital signs: Reviewed and stable  Last Vitals:  Vitals Value Taken Time  BP    Temp    Pulse    Resp    SpO2      Last Pain:  Vitals:   01/29/19 0423  TempSrc: Oral  PainSc:          Complications: No apparent anesthesia complications

## 2019-01-29 NOTE — Consult Note (Signed)
[facility]  Delivery Note:  C-section       01/29/2019  6:05 AM  I was called to the operating room at the request of the patient's obstetrician (Dr. Lynnette Caffey) for a primary C-section.  PRENATAL HX:  This is a 38 y/o G3P0110 at 13 and 4/[redacted] weeks gestation who was admitted for induction of labor.  Her pregnancy course has been complicated by A2 DM for which she is on glyburide with good control.  She also has chronic hypertension for which she takes labetalol and low-dose aspirin.  She has a history of IUFD at 47 weeks.  Delivery by C-section for persistent late decelerations.  AROM x1 hour.  She is GBS positive but received only a single dose of penicillin less than 4 hours prior to delivery.  DELIVERY: Cord clamping delayed for 1 minute.  Infant was vigorous at delivery, requiring no resuscitation other than standard warming, drying and stimulation.  APGARs 8 and 9.  Exam within normal limits.  After 5 minutes, baby left with nurse to assist parents with skin-to-skin care.   _____________________ Electronically Signed By: Clinton Gallant, MD Neonatologist

## 2019-01-29 NOTE — Progress Notes (Signed)
Dr Uvaldo Bristle at bedside discussing c-section with patient and husband. Questions answered. Consents signed.

## 2019-01-29 NOTE — Progress Notes (Signed)
Tracey Beard was referred for history of depression/anxiety.  * Referral screened out by Clinical Social Worker because none of the following criteria appear to apply:  ~ History of anxiety/depression during this pregnancy, or of post-partum depression following prior delivery. ~ Diagnosis of anxiety and/or depression within last 3 years. Per chart review, Tracey Beard diagnosed with anxiety and depression in 2015. OR * Tracey Beard's symptoms currently being treated with medication and/or therapy.  Please contact the Clinical Social Worker if needs arise, by [facility] request, or if Tracey Beard scores greater than 9/yes to question 10 on Edinburgh Postpartum Depression Screen.  Ollen Barges, [place]  [facility] and [employer] [phone]

## 2019-01-29 NOTE — Progress Notes (Signed)
Patient received one dose of VMP around 0100.  FHT Cat I.  Epidural catheter was placed; wet tap.  No medication infused.  Patient immediately experienced severe HA.  Anesthesia working to treat symptoms.  Blood pressures severe range immediately after catheter placement.  After about 30 minutes, BPs normalized spontaneously (no anti-hypertensives given), however FHT with late decelerations and variables.  Multiple position changes and fluid boluses given.  SVE 2-3/50/hi (too high for AROM).  Tracing improves with position change temporarily but decelerations return.  Will continue to change positions as overall improvement.  Continue to closely monitor.  Hold 0500 VMP dose.    [name], DO

## 2019-01-29 NOTE — Progress Notes (Signed)
SVE 2-3/50/-2, AROM for copious clear fluid.   IUPC and FSE placed.  [name], DO

## 2019-01-29 NOTE — Op Note (Signed)
Evalin Shawhan Ragin PROCEDURE DATE: 01/29/2019  PREOPERATIVE DIAGNOSIS: Intrauterine pregnancy at  [redacted]w[redacted]d weeks gestation, fetal intolerance of labor  POSTOPERATIVE DIAGNOSIS: The same  PROCEDURE:  Primary Low Transverse Cesarean Section  SURGEON:  Dr. [name]  INDICATIONS: ZEYNA MKRTCHYAN is a 38 y.o. G3P0110 at [redacted]w[redacted]d scheduled for cesarean section secondary to fetal intolerance of labor.  The risks of cesarean section discussed with the patient included but were not limited to: bleeding which may require transfusion or reoperation; infection which may require antibiotics; injury to bowel, bladder, ureters or other surrounding organs; injury to the fetus; need for additional procedures including hysterectomy in the event of a life-threatening hemorrhage; placental abnormalities wth subsequent pregnancies, incisional problems, thromboembolic phenomenon and other postoperative/anesthesia complications. The patient concurred with the proposed plan, giving informed written consent for the procedure.    FINDINGS:  Viable female infant in cephalic presentation, APGARs 8,9: weight pending  Clear amniotic fluid.  Intact placenta, three vessel cord.  Grossly normal uterus, ovaries and fallopian tubes. .   ANESTHESIA:    Epidural ESTIMATED BLOOD LOSS: 210 mL ml SPECIMENS: Placenta sent to L&D COMPLICATIONS: None immediate  PROCEDURE IN DETAIL:  The patient received intravenous antibiotics and had sequential compression devices applied to her lower extremities while in the preoperative area.  She was then taken to the operating room where epidural anesthesia was dosed up to surgical level and was found to be adequate. She was then placed in a dorsal supine position with a leftward tilt, and prepped and draped in a sterile manner.  A foley catheter was placed into her bladder and attached to constant gravity.  After an adequate timeout was performed, a Pfannenstiel skin incision was made with scalpel and  carried through to the underlying layer of fascia. The fascia was incised in the midline and this incision was extended bilaterally using the Mayo scissors. Kocher clamps were applied to the superior aspect of the fascial incision and the underlying rectus muscles were dissected off bluntly. A similar process was carried out on the inferior aspect of the facial incision. The rectus muscles were separated in the midline bluntly and the peritoneum was entered bluntly.  Bladder flap was created sharply and developed bluntly.  A transverse hysterotomy was made with a scalpel and extended bilaterally bluntly. The bladder blade was then removed. The infant was successfully delivered, and cord was clamped and cut and infant was handed over to awaiting neonatology team. Uterine massage was then administered and the placenta delivered intact with three-vessel cord. The uterus was cleared of clot and debris.  The hysterotomy was closed with 0 chromic.  A second imbricating suture of 0-Chromic was used to reinforce the incision and aid in hemostasis.  The peritoneum and rectus muscles were noted to be hemostatic and were reapproximated using 3-0 monocryl in a running fashion.  The fascia was closed with 0-Vicryl in a running fashion with good restoration of anatomy.  The subcutaneus tissue was copiously irrigated.  The skin was closed with 4-0 vicryl in a subcuticular fashion.  Pt tolerated the procedure will.  All counts were correct x2.  Pt went to the recovery room in stable condition.

## 2019-01-29 NOTE — H&P (Addendum)
Tracey Beard is a 38 y.o. female presenting for IOL.  Antepartum course complicated by [id number]; Glyburide with good control.  CHTN-Labetalol 200 bid and daily low dose ASA.  Patient has h/o IUFD at 29 weeks in 2018. H/o idiopathic intracranial hypertension.  H/O chronic back pain and oxycodone addiction; no use this pregnancy.  GBS Positive.  OB History    Gravida  3   Para  1   Term      Preterm  1   AB  1   Living  0     SAB  1   TAB      Ectopic      Multiple      Live Births             Past Medical History:  Diagnosis Date  . Anxiety   . Chronic back pain   . Depression   . Gestational diabetes    glyburide  . Headache   . Hypertension   . Idiopathic intracranial hypertension   . Leukocytosis 02/25/2015  . Pregnancy induced hypertension    labetalol   Past Surgical History:  Procedure Laterality Date  . CHOLECYSTECTOMY    . LUMBAR PUNCTURE  2014   5x   Family History: family history includes Breast cancer in her maternal aunt and mother; Colon cancer in her paternal grandmother; Diabetes in her maternal aunt, maternal grandfather, and mother; Heart disease in her maternal grandmother and paternal grandfather; Hypertension in her father, maternal aunt, maternal grandfather, maternal grandmother, maternal uncle, and mother; Liver cancer in her maternal uncle; Pancreatic cancer in her mother; Prostate cancer in her maternal uncle. Social History:  reports that she has never smoked. She has never used smokeless tobacco. She reports current alcohol use. She reports that she does not use drugs.     Maternal Diabetes: Yes:  Diabetes Type:  Insulin/Medication controlled Genetic Screening: Normal Maternal Ultrasounds/Referrals: Normal Fetal Ultrasounds or other Referrals:  None Maternal Substance Abuse:  No Significant Maternal Medications:  Meds include: Other: glyburide, labetalol Significant Maternal Lab Results:  Lab values include: Group B Strep  positive Other Comments:  None  ROS History Dilation: 2.5 Effacement (%): 50 Station: -3 Exam by:: Dr Tracey Beard Blood pressure 130/72, pulse 71, temperature 98.9 F (37.2 C), temperature source Oral, resp. rate 20, height 5\' 1"  (1.549 m), weight 101.2 kg, last menstrual period 04/07/2018. Exam Physical Exam  Prenatal labs: ABO, Rh: --/--/AB POS, AB POS Performed at [facility] [address], [place], [place] [address]  [phone]) Antibody: NEG (05/13 0038) Rubella: Immune (11/08 0000) RPR: Nonreactive (11/08 0000)  HBsAg: Negative (11/08 0000)  HIV: Non-reactive (11/08 0000)  GBS: Positive (04/27 0000)   Assessment/Plan: 37yo G3P0110 at [redacted]w[redacted]d for IOL -VMP -CLEA when desired -PCN for GBS ppx -Anticipate NSVD -A2DM-CBG <100.  OK to check q 4h -CHTN-Monitor BPs and tx with IV antihypertensives prn  Tracey Beard 01/29/2019, 4:11 AM

## 2019-01-29 NOTE — Anesthesia Postprocedure Evaluation (Signed)
Anesthesia Post Note  Patient: CHRISSA MEETZE  Procedure(s) Performed: CESAREAN SECTION (N/A )     Patient location during evaluation: Mother Baby Anesthesia Type: Epidural Level of consciousness: awake and alert Pain management: pain level controlled Vital Signs Assessment: post-procedure vital signs reviewed and stable Respiratory status: spontaneous breathing, nonlabored ventilation and respiratory function stable Cardiovascular status: stable Postop Assessment: no headache, no backache and epidural receding Anesthetic complications: no    Last Vitals:  Vitals:   01/29/19 1015 01/29/19 1154  BP: 128/83 133/77  Pulse: 60 (!) 57  Resp: 16 16  Temp: 36.7 C 36.8 C  SpO2:      Last Pain:  Vitals:   01/29/19 1154  TempSrc: Oral  PainSc:    Pain Goal:                   Faithe Ariola

## 2019-01-29 NOTE — Anesthesia Preprocedure Evaluation (Addendum)
Anesthesia Evaluation  Patient identified by MRN, date of birth, ID band Patient awake    Reviewed: Allergy & Precautions, NPO status , Patient's Chart, lab work & pertinent test results  Airway Mallampati: III  TM Distance: >3 FB Neck ROM: Full    Dental no notable dental hx. (+) Teeth Intact   Pulmonary neg pulmonary ROS,    Pulmonary exam normal breath sounds clear to auscultation       Cardiovascular hypertension, Pt. on medications and Pt. on home beta blockers Normal cardiovascular exam Rhythm:Regular Rate:Normal  Gestational HTN   Neuro/Psych  Headaches, Anxiety Depression Pt w Hx of Idiopathic Intercranial Hypertension    GI/Hepatic negative GI ROS, Neg liver ROS,   Endo/Other  diabetes  Renal/GU      Musculoskeletal   Abdominal (+) + obese,   Peds  Hematology Hgb 10.9 Plt 242   Anesthesia Other Findings All : Latex, Multiple TCA s ester local anesthetic  Reproductive/Obstetrics (+) Pregnancy                             Anesthesia Physical Anesthesia Plan  ASA: III  Anesthesia Plan: Epidural   Post-op Pain Management:    Induction:   PONV Risk Score and Plan:   Airway Management Planned:   Additional Equipment:   Intra-op Plan:   Post-operative Plan:   Informed Consent: I have reviewed the patients History and Physical, chart, labs and discussed the procedure including the risks, benefits and alternatives for the proposed anesthesia with the patient or authorized representative who has indicated his/her understanding and acceptance.       Plan Discussed with:   Anesthesia Plan Comments: (Had extensive talk w patient and husband about pseudotumor cerebri  prior to placement of dry cath, pt had been anxious bc she did not receive LEA anesthesia on her last visit. Upon further investigation of that visi:t a precipitous labor, combined w patients allergy list, which  included latex as well as ester anesthetics and a predilection against narcotics ( causing her to itch) probably contributed to that decision.  Pt is ok with the use of fentanyl in the epidural infusion )       Anesthesia Quick Evaluation

## 2019-01-29 NOTE — Progress Notes (Signed)
Persistent late decelerations despite position changes and terbutaline.  Recommend C/S and patient agrees.  Patient is counseled re: risk of bleeding, infection scarring, and damage to surrounding structures.  She understands implications in future pregnancies.  All questions were answered and the patient wishes to proceed.

## 2019-01-30 LAB — CBC
HCT: 28 % — ABNORMAL LOW (ref 36.0–46.0)
Hemoglobin: 9.2 g/dL — ABNORMAL LOW (ref 12.0–15.0)
MCH: 27.6 pg (ref 26.0–34.0)
MCHC: 32.9 g/dL (ref 30.0–36.0)
MCV: 84.1 fL (ref 80.0–100.0)
Platelets: 217 10*3/uL (ref 150–400)
RBC: 3.33 MIL/uL — ABNORMAL LOW (ref 3.87–5.11)
RDW: 14.7 % (ref 11.5–15.5)
WBC: 15.1 10*3/uL — ABNORMAL HIGH (ref 4.0–10.5)
nRBC: 0 % (ref 0.0–0.2)

## 2019-01-30 LAB — GLUCOSE, CAPILLARY: Glucose-Capillary: 81 mg/dL (ref 70–99)

## 2019-01-30 LAB — BIRTH TISSUE RECOVERY COLLECTION (PLACENTA DONATION)

## 2019-01-30 LAB — RUBELLA SCREEN: Rubella: 1.75 index (ref >0.99)

## 2019-01-30 MED ORDER — PANTOPRAZOLE SODIUM 20 MG PO TBEC
20.0000 mg | DELAYED_RELEASE_TABLET | Freq: Two times a day (BID) | ORAL | Status: DC
  Administered 2019-01-30 – 2019-02-02 (×6): 20 mg via ORAL
  Filled 2019-01-30 (×9): qty 1

## 2019-01-30 NOTE — Lactation Note (Signed)
This note was copied from a baby's chart. Lactation Consultation Note  Patient Name: Tracey Beard [id number] Date: 01/30/2019 Reason for consult: Follow-up assessment;1st time breastfeeding;Primapara;Early term 37-38.6wks;NICU baby;Difficult latch Type of Endocrine Disorder?: Diabetes, Chronic HTN  Visited with P27 Mom of ET infant at 26 hrs old.  Baby born weighing 6 lbs 1oz.   Baby transferred to [place] in the NICU for low blood sugars.  Baby has IV fluids running.  Baby has been exclusively breastfed using a 24 mm nipple shield.  Baby sucking vigorously on pacifier.  Mom states baby just fed a hour ago for 30 mins.  Talked about normal newborn breastfeeding patterns, and to expect baby to be more hungry today.   Offered to assist with baby's positioning and latch.  Mom positioned in chair, pillow to her back to promote a more upright position.  Baby supported well at height of breast.  Mom has large, heavy breasts, with semi-flat nipples (short shafted).  Breasts feeling heavier today, and ductal tissue palpated on right breast.  Hand expression revealed small glistening of colostrum.  Reviewed and demonstrated how to correctly apply nipple shield to pull nipple well into shield.  Baby opened his mouth in football hold, and latched well, tug on chin to open mouth wider.  Lips flanged well.  Baby initially pulling in with cheeks, but eventually got into a deeper suck pattern.  A few swallows identified.  After 10 mins, baby fell asleep, nipple pulled well into shield, but unable to note any colostrum in shield.   Baby contented and placed back in bed.  Assisted Mom with double pumping.  Mom to use initiation setting, and increase frequency of pumping today, as Mom only pumped 2 times yesterday.  Mom has a DEBP at home.   Plan- 1- Keep baby STS as much as possible 2- Feed baby with any cue, awakening after 3 hrs as needed 3- Pre-pump using hand pump and apply nipple shield  4- Breastfeed  baby, offering both breasts prn. 5- Pump both breasts on initiation setting, using breast massage and hand expression (goal of >8 times per 24 hrs) 6- Supplementation today per NICU volume guidelines using EBM and adding formula as bridge to exclusive breastfeeding.  Reassured Mom.  Encouraged Mom to call prn for assistance.  Mardene Sayer RN given report.  Feeding Feeding Type: Breast Fed  LATCH Score Latch: Repeated attempts needed to sustain latch, nipple held in mouth throughout feeding, stimulation needed to elicit sucking reflex.  Audible Swallowing: A few with stimulation  Type of Nipple: Flat  Comfort (Breast/Nipple): Soft / non-tender  Hold (Positioning): Assistance needed to correctly position infant at breast and maintain latch.  LATCH Score: 6  Interventions Interventions: Breast feeding basics reviewed;Assisted with latch;Skin to skin;Breast massage;Hand express;Support pillows;Adjust position;Breast compression;Pre-pump if needed;Shells;Hand pump;DEBP  Lactation Tools Discussed/Used Tools: Shells;Pump;Nipple Shields Nipple shield size: 24 Flange Size: 24 Shell Type: Inverted Breast pump type: Double-Electric Breast Pump;Manual   Consult Status Consult Status: Follow-up Date: 01/31/19    Broadus John 01/30/2019, 8:40 AM

## 2019-01-30 NOTE — Progress Notes (Signed)
Post Partum Day 1 Subjective: no complaints, voiding, tolerating PO and + flatus  Objective: Blood pressure 116/72, pulse 76, temperature 98.2 F (36.8 C), temperature source Oral, resp. rate 18, height 5\' 1"  (1.549 m), weight 101.2 kg, last menstrual period 04/07/2018, SpO2 97 %.  Physical Exam:  General: alert and cooperative Lochia: appropriate Uterine Fundus: firm Incision: healing well, no significant drainage DVT Evaluation: No evidence of DVT seen on physical exam.  Recent Labs    01/29/19 0027 01/30/19 0715  HGB 10.9* 9.2*  HCT 33.6* 28.0*    Assessment/Plan: Continue current care.    LOS: 1 day   Tracey Beard 01/30/2019, 9:02 AM

## 2019-01-30 NOTE — Progress Notes (Signed)
Patient screened out for psychosocial assessment since none of the following apply:  Psychosocial stressors documented in mother or baby's chart  Gestation less than 32 weeks  Code at delivery   Infant with anomalies Please contact the Clinical Social Worker if specific needs arise, by MOB's request, or if MOB scores greater than 9/yes to question 10 on Edinburgh Postpartum Depression Screen.  Redding Cloe, LCSW Clinical Social Worker [facility] Cell#: [phone]     

## 2019-01-30 NOTE — Lactation Note (Signed)
This note was copied from a baby's chart. Lactation Consultation Note Baby 71 hrs old. Baby BF. Mom has flat nipples. Hand expressed for several minutes w/no glistening of colostrum. Mom BF w/#24 NS no colostrum noted. Baby BF 25 min w/no colostrum noted. Mom has not wanted to supplement w/formula. LC discussed supplementing w/formula until colostrum volume increases. Discussed sometimes consistency of colostrum thick and sometimes difficult to get going. Pumping is important for stimulation. Mom agrees to supplement. NICU RN will consult w/NP as to what formula to give and how. LC discussed w/mom syring/finger feeding and 5 fr feeding at the breast after BF some, then insert tubing into NS for baby to get supplement. Different options.  Mom stated she hasn't slept and is exhausted. Encouraged mom to sleep. Mom had laid down when [facility] returned w/ supplies. Left at bedside. Reported to RN.  Patient Name: Tracey Beard [id number] Date: 01/30/2019 Reason for consult: Follow-up assessment;NICU baby;Maternal endocrine disorder Type of Endocrine Disorder?: Diabetes   Maternal Data    Feeding Feeding Type: Breast Fed  LATCH Score Latch: Repeated attempts needed to sustain latch, nipple held in mouth throughout feeding, stimulation needed to elicit sucking reflex.  Audible Swallowing: A few with stimulation  Type of Nipple: Flat  Comfort (Breast/Nipple): Soft / non-tender  Hold (Positioning): Full assist, staff holds infant at breast  LATCH Score: 5  Interventions Interventions: Breast feeding basics reviewed;Skin to skin;Breast massage;Hand express;Breast compression;Shells  Lactation Tools Discussed/Used Tools: Pump;Shells;Coconut oil;Nipple Shields(#24) Nipple shield size: (24)   Consult Status Consult Status: Follow-up Date: 01/30/19 Follow-up type: In-patient    Theodoro Kalata 01/30/2019, 3:55 AM

## 2019-01-31 NOTE — Progress Notes (Signed)
Subjective: Postpartum Day 2: Cesarean Delivery Patient reports tolerating PO, + flatus and no problems voiding.    Objective: Vital signs in last 24 hours: Temp:  [98.2 F (36.8 C)-98.3 F (36.8 C)] 98.3 F (36.8 C) (05/15 0622) Pulse Rate:  [79-90] 90 (05/15 0622) Resp:  [17] 17 (05/14 2024) BP: (114-130)/(67-77) 130/77 (05/15 0630)  Physical Exam:  General: alert, cooperative, appears stated age and no distress Lochia: appropriate Uterine Fundus: firm Incision: healing well DVT Evaluation: No evidence of DVT seen on physical exam.  Recent Labs    01/29/19 0027 01/30/19 0715  HGB 10.9* 9.2*  HCT 33.6* 28.0*    Assessment/Plan: Status post Cesarean section. Doing well postoperatively.  Baby with glc issues in NICU. A2DM now off meds and normal Glc levels HTN controlled on no meds.  Luz Lex 01/31/2019, 10:14 AM

## 2019-02-01 NOTE — Lactation Note (Signed)
This note was copied from a baby's chart. Lactation Consultation Note  Patient Name: Boy Anastasya Jewell [id number] Date: 02/01/2019 Reason for consult: Follow-up assessment;Primapara;1st time breastfeeding;NICU baby;Early term 37-38.6wks Type of Endocrine Disorder?: Diabetes  Visited with Mom of baby in the NICU.  45 IV DC'd today.  Baby is breastfeeding and getting a bottle of EBM afterwards.  Volume up to 30 ml.    Baby placed STS at left breast in football hold.  Baby fussing and crying.  Mom was getting teary, so placed STS on Mom's chest.  Baby was not ready to feed.  Baby remained quiet and alert, staring at Mom's face.  Spent time reassuring Mom and listening to Mom.   Baby eventually latched with Mom using a 24 mm nipple shield and fed for 10 mins with multiple swallows identified.  Milk filled the nipple shield when baby came off the breast.    Praised Mom for all her hard work and [employer].   Talked to Mom about OP lactation support available to her.  (virtual support groups and OP appointments)  Interventions Interventions: Breast feeding basics reviewed;Assisted with latch;Skin to skin;Breast massage;Hand express;Support pillows;Position options;Expressed milk;DEBP;Breast compression;Adjust position  Lactation Tools Discussed/Used Tools: Nipple Shields Nipple shield size: 24 Flange Size: 24 Shell Type: Inverted Breast pump type: Double-Electric Breast Pump   Consult Status Consult Status: Follow-up Date: 02/02/19 Follow-up type: In-patient    Broadus John 02/01/2019, 3:41 PM

## 2019-02-01 NOTE — Progress Notes (Signed)
Subjective: Postpartum Day 3: Cesarean Delivery Patient reports tolerating PO, + flatus and no problems voiding.    Objective: Vital signs in last 24 hours: Temp:  [97.6 F (36.4 C)-98.7 F (37.1 C)] 97.6 F (36.4 C) (05/16 0750) Pulse Rate:  [88-97] 91 (05/16 0750) Resp:  [18] 18 (05/16 0750) BP: (124-145)/(77-89) 140/86 (05/16 0750) SpO2:  [98 %-100 %] 98 % (05/16 0610)  Physical Exam:  General: alert, cooperative, appears stated age and no distress Lochia: appropriate Uterine Fundus: firm Incision: healing well DVT Evaluation: No evidence of DVT seen on physical exam.  Recent Labs    01/30/19 0715  HGB 9.2*  HCT 28.0*    Assessment/Plan: Status post Cesarean section. Doing well postoperatively.  Continue current care Anticipate dc tomorow.  Luz Lex 02/01/2019, 10:10 AM

## 2019-02-02 ENCOUNTER — Encounter (HOSPITAL_COMMUNITY): Payer: Self-pay | Admitting: *Deleted

## 2019-02-02 MED ORDER — IBUPROFEN 800 MG PO TABS: 800.0000 mg | ORAL_TABLET | Freq: Four times a day (QID) | ORAL | 0 refills | Status: DC | PRN

## 2019-02-02 MED ORDER — OXYCODONE-ACETAMINOPHEN 5-325 MG PO TABS: 1.0000 | ORAL_TABLET | ORAL | 0 refills | Status: DC | PRN

## 2019-02-02 NOTE — Progress Notes (Signed)
Upon receiving report on this patient, RN is instructed that patient has been discharged but hasn't been removed from the system to allow significant other a little more time in the room.

## 2019-02-02 NOTE — Discharge Summary (Signed)
Obstetric Discharge Summary Reason for Admission: induction of labor Prenatal Procedures: none Intrapartum Procedures: cesarean: low cervical, transverse Postpartum Procedures: none Complications-Operative and Postpartum: none Hemoglobin  Date Value Ref Range Status  01/30/2019 9.2 (L) 12.0 - 15.0 g/dL Final   HGB  Date Value Ref Range Status  02/25/2015 12.0 11.6 - 15.9 g/dL Final   HCT  Date Value Ref Range Status  01/30/2019 28.0 (L) 36.0 - 46.0 % Final  02/25/2015 35.6 34.8 - 46.6 % Final    Physical Exam:  General: alert, cooperative, appears stated age and no distress Lochia: appropriate Uterine Fundus: firm Incision: healing well DVT Evaluation: No evidence of DVT seen on physical exam.  Discharge Diagnoses: Term Pregnancy-delivered  Discharge Information: Date: 02/02/2019 Activity: pelvic rest Diet: routine Medications: Ibuprofen and Percocet Condition: stable Instructions: refer to practice specific booklet Discharge to: home   Newborn Data: Live born female  Birth Weight: 6 lb 1 oz (2750 g) APGAR: 8, 9  Newborn Delivery   Birth date/time:  01/29/2019 05:55:00 Delivery type:  C-Section, Low Transverse Trial of labor:  No C-section categorization:  Primary     Home with NICU due to low glucose levels.  Luz Lex 02/02/2019, 9:04 AM

## 2019-02-02 NOTE — Lactation Note (Signed)
This note was copied from a baby's chart. Lactation Consultation Note  Patient Name: Tracey Beard LFYBO'F Date: 02/02/2019 Reason for consult: Follow-up assessment;Primapara;1st time breastfeeding;NICU baby;Early term 37-38.6wks;Maternal endocrine disorder Type of Endocrine Disorder?: Diabetes  Visited with mom of a 71 day old early term baby, RN Kim asked for assistance, mom was getting engorged. She is already applying ice packs to relieve fullness. Mom voiced to [facility] that her RN Maudie Mercury helped her with breast massage and that she "popped" a lot of the nodes and she's feeling so much better now. Breast appear soft upon examination, the left one (where mom said had the most nodes) was soft after all that breast massage and her last pumping session. Noted some nodes on the right which LC assisted with, advised mom to rotate her ice packs at the 20 minutes mark (she had them at the bottom of her breasts but not at the top portion).  When LC checked pump containers, noticed that mom has got 80 ml of EBM in her last pumping session. Reviewed breastmilk storage guidelines and helped mom with labeling the containers. When attempting hand expression, noticed that her tissue was not very compressible, no colostrum seen yet, advised mom to keep putting on ice throughout the day until fullness subsides.   Mom's main concern is that she didn't want to do formula on the first place and she's now stocking up the refrigerator in her room with her breastmilk while baby was on Similac 24 calorie formula. Checked flowsheet and saw the last time baby had formula was yesterday, which may have contributed to mother's onset of physilogical engorgement. Dad already feeding baby a bottle during [facility] consultation. Per NICU RN Jackelyn Poling mom is not using Similac 24 calorie anymore but her own breastmilk fortified to 24 calorie formula.   Feeding plan:  1. Encouraged mom to feed baby STS as much as baby wants to at least 8-12  times/24 hours; using NS # 24 PRN 2. Mom will continue supplementing baby with feedings at lib; every 3 hours or sooner with 60 ml per feeding as stated on feeding plan using mother's EBM fortified 3. Mom will continue to ice her breast and pump every 3 hours and at least once at night  Parents reported all questions and concerns were answered, they're both aware of [place] services and will call PRN.    Maternal Data    Feeding Feeding Type: Bottle Fed - Breast Milk Nipple Type: Slow - flow   Interventions Interventions: Breast feeding basics reviewed;Ice;DEBP;Breast compression;Hand express;Breast massage;Expressed milk  Lactation Tools Discussed/Used Tools: Pump Breast pump type: Double-Electric Breast Pump   Consult Status Consult Status: Follow-up Date: 02/03/19 Follow-up type: In-patient    [place] 02/02/2019, 1:12 PM

## 2019-02-15 ENCOUNTER — Inpatient Hospital Stay (HOSPITAL_COMMUNITY): Admit: 2019-02-15 | Payer: Self-pay

## 2019-05-23 IMAGING — CT CT ABD-PELV W/ CM
2 of 4 series · 16 of 46 positions shown, 18 images · IV contrast (ISOVUE)
Comparison: Pelvic ultrasound performed earlier today at [DATE] p.m.

CLINICAL DATA: Subacute onset of left lower quadrant abdominal
pain. Initial encounter.

EXAM:
CT ABDOMEN AND PELVIS WITH CONTRAST
TECHNIQUE: Multidetector CT imaging of the abdomen and pelvis was performed
using the standard protocol following bolus administration of
intravenous contrast.
CONTRAST:  100 mL [id number] IOPAMIDOL ([id number]) INJECTION 61%

[Series 2: abd/pel with · axial · 0.82mm/px · z∈[-234,+160]mm · 13 of 89 slices shown, 15 images]
[im 5/89  soft-tissue]
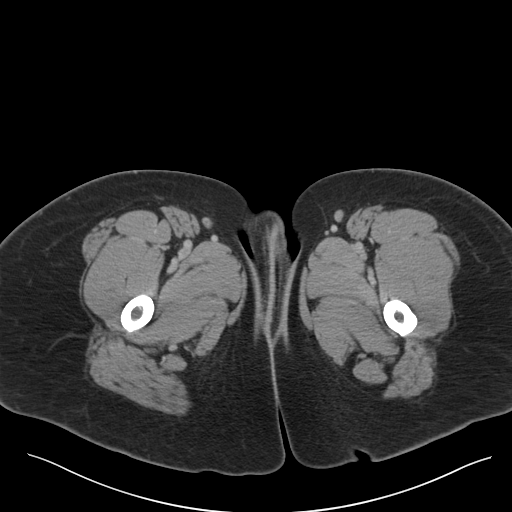
[im 5/89  bone]
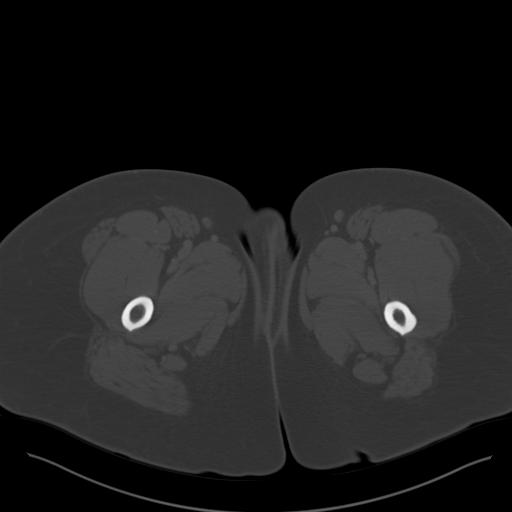
[im 14/89  soft-tissue]
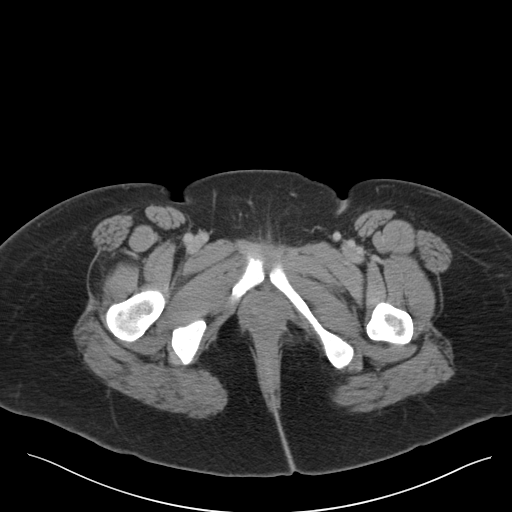
[im 18/89  soft-tissue]
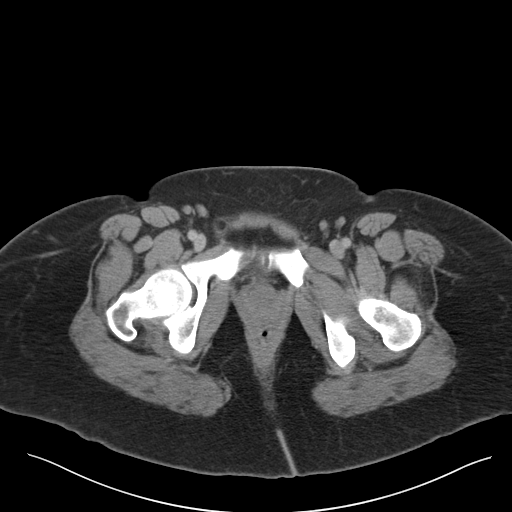
[im 27/89  soft-tissue]
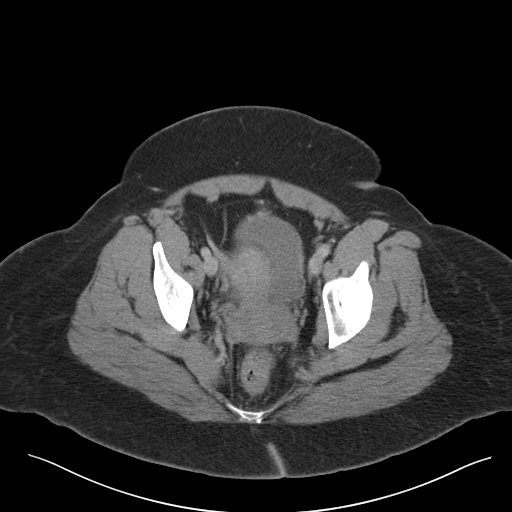
[im 31/89  soft-tissue]
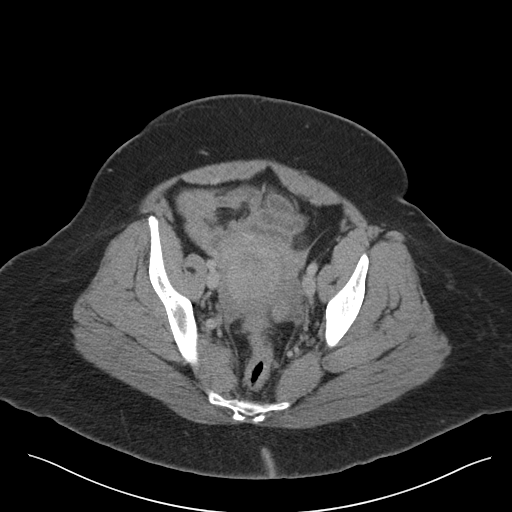
[im 40/89  soft-tissue]
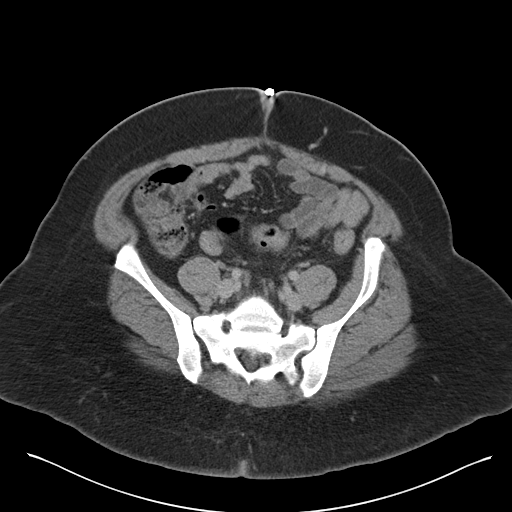
[im 45/89  soft-tissue]
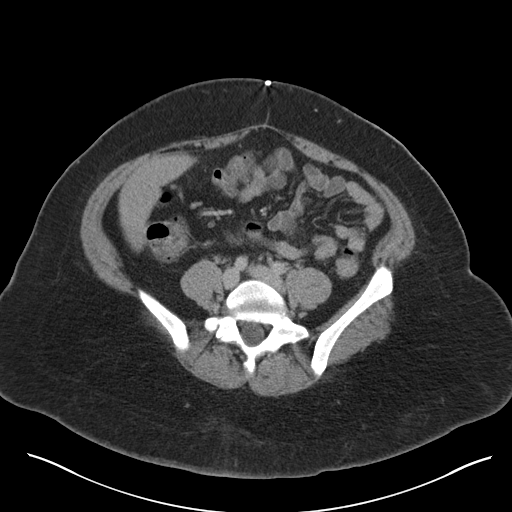
[im 49/89  soft-tissue]
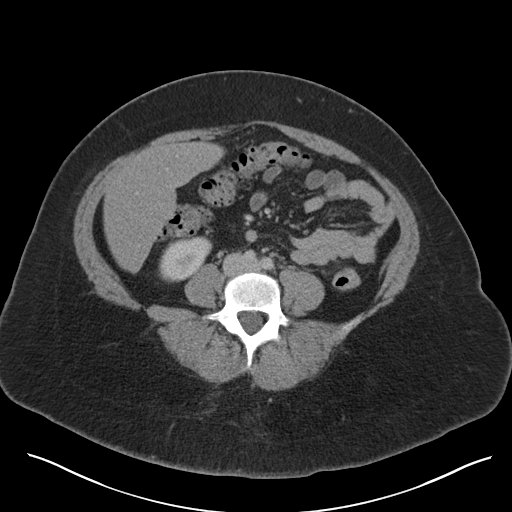
[im 58/89  soft-tissue]
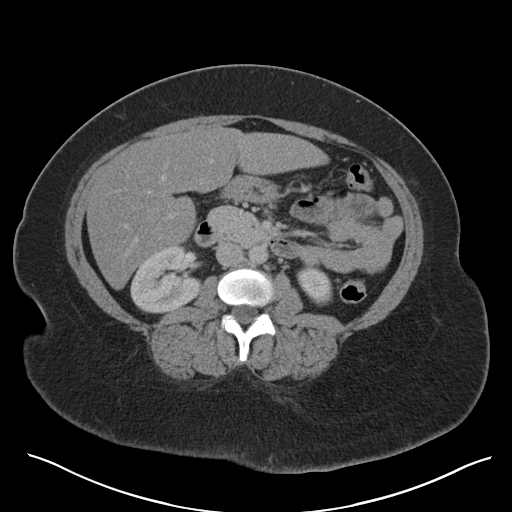
[im 58/89  bone]
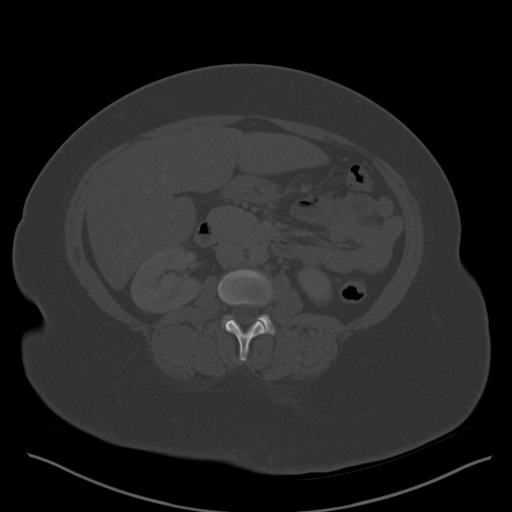
[im 62/89  soft-tissue]
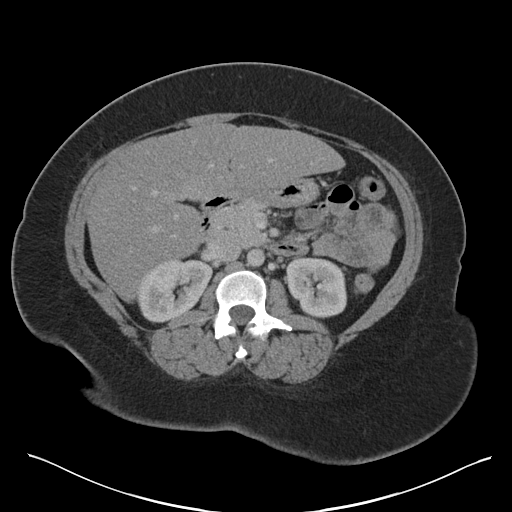
[im 71/89  soft-tissue]
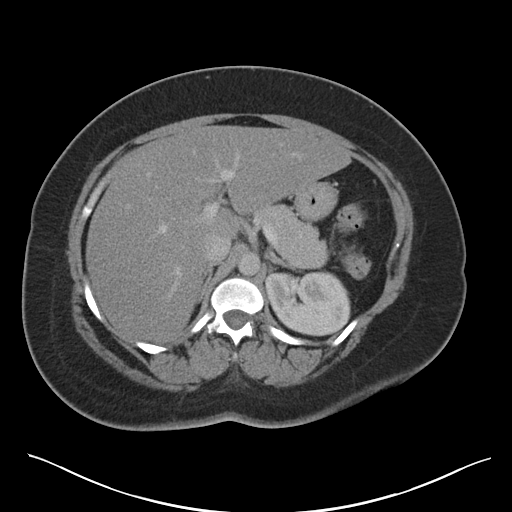
[im 75/89  soft-tissue]
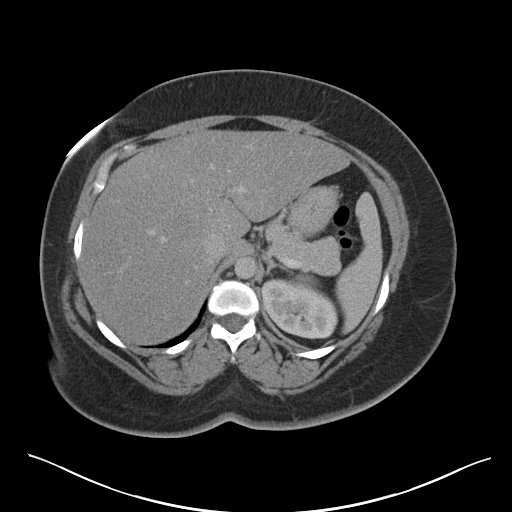
[im 84/89  soft-tissue]
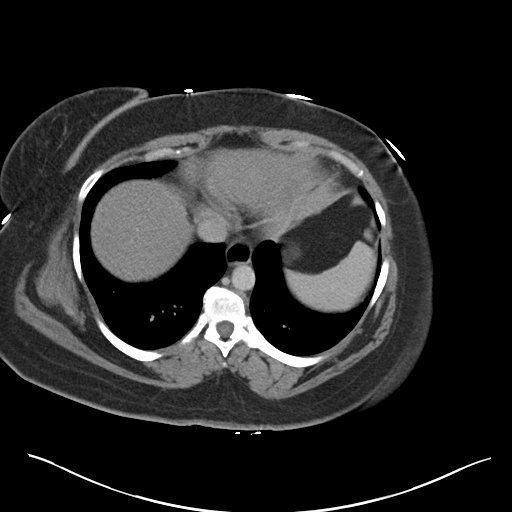

[Series 3: coronal a/|p · coronal · 0.80mm/px · 3 of 191 slices shown]
[im 64/191  soft-tissue]
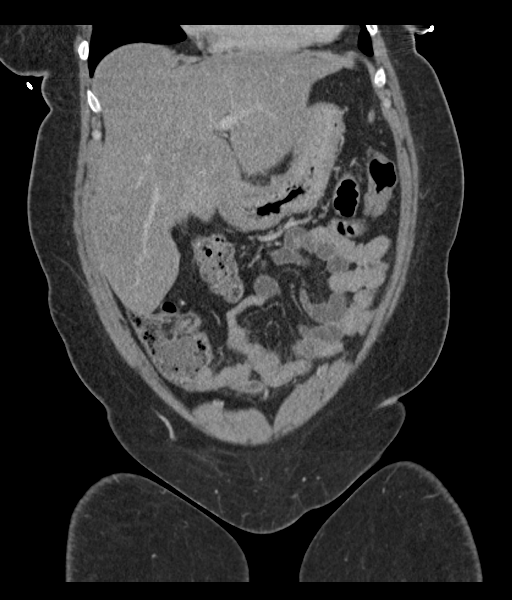
[im 85/191  soft-tissue]
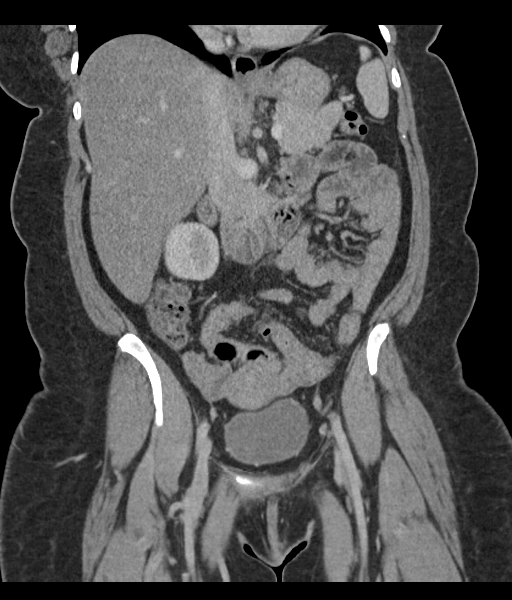
[im 106/191  soft-tissue]
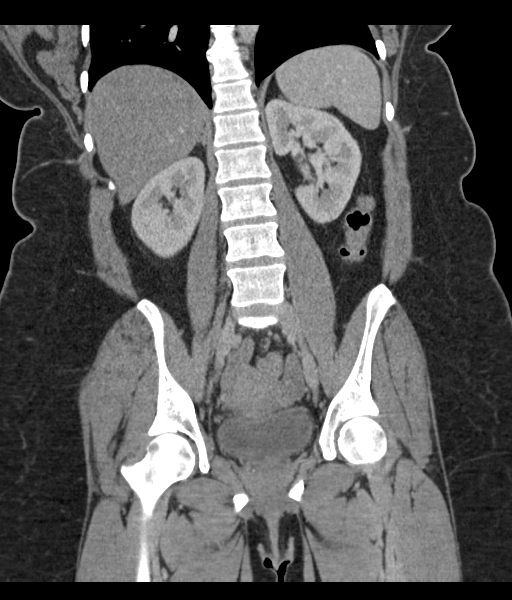

[16 of 46 positions shown; findings below may reference images not displayed]

FINDINGS: Lower chest: The visualized lung bases are grossly clear. The
visualized portions of the mediastinum are unremarkable.

Hepatobiliary: The liver is unremarkable in appearance. The patient
is status post cholecystectomy, with clips noted at the gallbladder
fossa. The common bile duct remains normal in caliber.

Pancreas: The pancreas is within normal limits.

Spleen: The spleen is unremarkable in appearance.

Adrenals/Urinary Tract: The adrenal glands are unremarkable in
appearance. A tiny left renal cyst is noted. There is no evidence of
hydronephrosis. No renal or ureteral stones are identified. No
perinephric stranding is seen.

Stomach/Bowel: The stomach is unremarkable in appearance. The small
bowel is within normal limits. The appendix is normal in caliber,
without evidence of appendicitis. The colon is unremarkable in
appearance.

Vascular/Lymphatic: The abdominal aorta is unremarkable in
appearance. The inferior vena cava is grossly unremarkable. No
retroperitoneal lymphadenopathy is seen. No pelvic sidewall
lymphadenopathy is identified.

Reproductive: The bladder is mildly distended and grossly
unremarkable. The uterus is unremarkable in appearance. The ovaries
are relatively symmetric. No suspicious adnexal masses are seen.

Other: A metallic piercing is noted at the umbilicus.

Musculoskeletal: No acute osseous abnormalities are identified. The
visualized musculature is unremarkable in appearance.
IMPRESSION: 1. No acute abnormality seen within the abdomen or pelvis.
2. Tiny left renal cyst.

## 2019-07-14 ENCOUNTER — Other Ambulatory Visit: Payer: Self-pay

## 2019-07-14 DIAGNOSIS — Z20822 Contact with and (suspected) exposure to covid-19: Secondary | ICD-10-CM

## 2019-07-16 LAB — NOVEL CORONAVIRUS, NAA: SARS-CoV-2, NAA: NOT DETECTED

## 2019-10-21 ENCOUNTER — Ambulatory Visit: Payer: Commercial Managed Care - PPO | Attending: Internal Medicine

## 2019-10-21 DIAGNOSIS — Z20822 Contact with and (suspected) exposure to covid-19: Secondary | ICD-10-CM

## 2019-10-22 LAB — NOVEL CORONAVIRUS, NAA: SARS-CoV-2, NAA: NOT DETECTED

## 2020-04-30 ENCOUNTER — Encounter (HOSPITAL_COMMUNITY): Payer: Self-pay | Admitting: Emergency Medicine

## 2020-04-30 ENCOUNTER — Emergency Department (HOSPITAL_COMMUNITY)
Admission: EM | Admit: 2020-04-30 | Discharge: 2020-05-01 | Discharge status: Against Medical Advice | Payer: Commercial Managed Care - PPO | Attending: Emergency Medicine | Admitting: Emergency Medicine

## 2020-04-30 ENCOUNTER — Other Ambulatory Visit: Payer: Self-pay

## 2020-04-30 DIAGNOSIS — J029 Acute pharyngitis, unspecified: Secondary | ICD-10-CM | POA: Insufficient documentation

## 2020-04-30 DIAGNOSIS — Z20822 Contact with and (suspected) exposure to covid-19: Secondary | ICD-10-CM | POA: Insufficient documentation

## 2020-04-30 DIAGNOSIS — I10 Essential (primary) hypertension: Secondary | ICD-10-CM | POA: Insufficient documentation

## 2020-04-30 DIAGNOSIS — Z91040 Latex allergy status: Secondary | ICD-10-CM | POA: Insufficient documentation

## 2020-04-30 LAB — CBC
HCT: 38.8 % (ref 36.0–46.0)
Hemoglobin: 12.8 g/dL (ref 12.0–15.0)
MCH: 31.3 pg (ref 26.0–34.0)
MCHC: 33 g/dL (ref 30.0–36.0)
MCV: 94.9 fL (ref 80.0–100.0)
Platelets: 228 10*3/uL (ref 150–400)
RBC: 4.09 MIL/uL (ref 3.87–5.11)
RDW: 12.5 % (ref 11.5–15.5)
WBC: 10.1 10*3/uL (ref 4.0–10.5)
nRBC: 0 % (ref 0.0–0.2)

## 2020-04-30 LAB — COMPREHENSIVE METABOLIC PANEL
ALT: 19 U/L (ref 0–44)
AST: 18 U/L (ref 15–41)
Albumin: 4.5 g/dL (ref 3.5–5.0)
Alkaline Phosphatase: 62 U/L (ref 38–126)
Anion gap: 12 (ref 5–15)
BUN: 8 mg/dL (ref 6–20)
CO2: 27 mmol/L (ref 22–32)
Calcium: 9.5 mg/dL (ref 8.9–10.3)
Chloride: 101 mmol/L (ref 98–111)
Creatinine, Ser: 0.72 mg/dL (ref 0.44–1.00)
GFR calc Af Amer: >60 mL/min (ref >60)
GFR calc non Af Amer: >60 mL/min (ref >60)
Glucose, Bld: 116 mg/dL — ABNORMAL HIGH (ref 70–99)
Potassium: 3.7 mmol/L (ref 3.5–5.1)
Sodium: 140 mmol/L (ref 135–145)
Total Bilirubin: 0.9 mg/dL (ref 0.3–1.2)
Total Protein: 7.8 g/dL (ref 6.5–8.1)

## 2020-04-30 LAB — LIPASE, BLOOD: Lipase: 31 U/L (ref 11–51)

## 2020-04-30 LAB — GROUP A STREP BY PCR: Group A Strep by PCR: NOT DETECTED

## 2020-04-30 LAB — I-STAT BETA HCG BLOOD, ED (MC, WL, AP ONLY): I-stat hCG, quantitative: <5 m[IU]/mL (ref <5)

## 2020-04-30 LAB — SARS CORONAVIRUS 2 BY RT PCR (HOSPITAL ORDER, PERFORMED IN ~~LOC~~ HOSPITAL LAB): SARS Coronavirus 2: NEGATIVE

## 2020-04-30 LAB — LACTIC ACID, PLASMA: Lactic Acid, Venous: 2.2 mmol/L (ref 0.5–1.9)

## 2020-04-30 MED ORDER — IBUPROFEN 800 MG PO TABS: 800.0000 mg | ORAL_TABLET | Freq: Once | ORAL | Status: DC

## 2020-04-30 MED ORDER — DEXAMETHASONE SODIUM PHOSPHATE 10 MG/ML IJ SOLN
10.0000 mg | Freq: Once | INTRAMUSCULAR | Status: AC
  Administered 2020-05-01: 10 mg via INTRAMUSCULAR
  Filled 2020-04-30: qty 1

## 2020-04-30 MED ORDER — IBUPROFEN 800 MG PO TABS
800.0000 mg | ORAL_TABLET | Freq: Once | ORAL | Status: AC
  Administered 2020-04-30: 800 mg via ORAL
  Filled 2020-04-30: qty 1

## 2020-04-30 MED ORDER — ONDANSETRON 4 MG PO TBDP
4.0000 mg | ORAL_TABLET | Freq: Once | ORAL | Status: AC | PRN
  Administered 2020-04-30: 4 mg via ORAL
  Filled 2020-04-30: qty 1

## 2020-04-30 NOTE — ED Provider Notes (Signed)
Blood pressure (!) 158/82, pulse (!) 118, temperature (!) 102.8 F (39.3 C), temperature source Oral, resp. rate 20, SpO2 100 %, unknown if currently breastfeeding.  Assuming care from Dr. Karle Starch.  In short, Tracey Beard is a 39 y.o. female with a chief complaint of Sore Throat .  Refer to the original H&P for additional details.  The current plan of care is to f/u COVID test and reassess.  Patient reevaluated.  Her fever has come down after treatment in the emergency department.  Her vital signs are largely normalized in that setting.  She has known exposure to hand-foot-and-mouth which seems to be the likely source of her symptoms.  She is not experiencing urinary tract infection symptoms.  She had some mild abdominal discomfort while in the waiting room but that has resolved.  She is overall well-appearing.  Second lactic acid has been drawn as the first was elevated. Doubt sepsis clinically.   03:10 AM  Made aware that patient ultimately left prior to her second lactate being back.  There was a lab issue which delayed the test result.  The repeat lactate is within normal limits. Discussed supportive care plan with patient along with fever control on my initial evaluation.    Margette Fast, MD 05/01/20 [id number]

## 2020-04-30 NOTE — ED Triage Notes (Signed)
Patient states that she began having some throat pain last night, and when she woke up this morning states she felt like she was swallowing glass and noted to have a fever. Patient has been rotating ibuprofen and tylenol throughout the day and has been unable to keep her fever under control. Patient's son does have hand, foot and mouth.

## 2020-04-30 NOTE — ED Provider Notes (Signed)
[facility] DEPART[facility] Provider Note  CSN: [id number] Arrival date & time: 04/30/20 1924    History Chief Complaint  Patient presents with  . Sore Throat    HPI  Tracey Beard is a 39 y.o. female reports she noticed a scratchy throat last night that became progressively worse as the night went on. Noticed she was running a fever this morning when she wokeup. She has been taking Motrin 800mg  (last at 1500), APAP 1000mg  (last at 1800) as well as Nyquil through the day without improvement. She works in [employer] and had a negative surveillance Covid test Monday of this week but was not having any symptoms then. Her son was recently diagnosed with Hand, Foot and Mouth disease from daycare.    Past Medical History:  Diagnosis Date  . Anxiety   . Chronic back pain   . Depression   . Gestational diabetes    glyburide  . Headache   . Hypertension   . Idiopathic intracranial hypertension   . Leukocytosis 02/25/2015  . Pregnancy induced hypertension    labetalol    Past Surgical History:  Procedure Laterality Date  . CESAREAN SECTION N/A 01/29/2019   Procedure: CESAREAN SECTION;  Surgeon: [name], DO;  Location: [facility] LD ORS;  Service: Obstetrics;  Laterality: N/A;  . CHOLECYSTECTOMY    . LUMBAR PUNCTURE  2014   5x    Family History  Problem Relation Age of Onset  . Hypertension Mother   . Breast cancer Mother   . Pancreatic cancer Mother   . Diabetes Mother   . Hypertension Father   . Hypertension Maternal Aunt   . Breast cancer Maternal Aunt   . Hypertension Maternal Uncle   . Liver cancer Maternal Uncle   . Hypertension Maternal Grandmother   . Heart disease Maternal Grandmother   . Hypertension Maternal Grandfather   . Diabetes Maternal Grandfather   . Colon cancer Paternal Grandmother   . Heart disease Paternal Grandfather   . Prostate cancer Maternal Uncle   . Diabetes Maternal Aunt     Social History   Tobacco Use  . Smoking status:  Never Smoker  . Smokeless tobacco: Never Used  Vaping Use  . Vaping Use: Never used  Substance Use Topics  . Alcohol use: Yes    Comment: socially  . Drug use: No     Home Medications Prior to Admission medications   Medication Sig Start Date End Date Taking? Authorizing Provider  ibuprofen (ADVIL) 800 MG tablet Take 1 tablet (800 mg total) by mouth every 6 (six) hours as needed for moderate pain. 02/02/19   Louretta Shorten, MD  lansoprazole (PREVACID) 30 MG capsule Take 30 mg by mouth daily at 12 noon.    [provider]  oxyCODONE-acetaminophen (PERCOCET/ROXICET) 5-325 MG tablet Take 1-2 tablets by mouth every 4 (four) hours as needed for moderate pain. 02/02/19   Louretta Shorten, MD  Prenatal Vit-Fe Fumarate-FA (PRENATAL MULTIVITAMIN) TABS tablet Take 1 tablet by mouth daily at 12 noon.    [provider]     Allergies    Elavil [amitriptyline hcl], Restoril [temazepam], Trazodone and nefazodone, Diamox [acetazolamide], Tomato, Maxalt [rizatriptan benzoate], Benzocaine, Latex, and Novocain [procaine]   Review of Systems   Review of Systems A comprehensive review of systems was completed and negative except as noted in HPI.    Physical Exam BP (!) 158/82 (BP Location: Left Arm)   Pulse (!) 118   Temp (!) 102.8 F (39.3  C) (Oral)   Resp 20   SpO2 100%   Physical Exam Vitals and nursing note reviewed.  Constitutional:      Appearance: Normal appearance.  HENT:     Head: Normocephalic and atraumatic.     Nose: Nose normal.     Mouth/Throat:     Mouth: Mucous membranes are moist.     Pharynx: Pharyngeal swelling and posterior oropharyngeal erythema present. No oropharyngeal exudate.     Tonsils: No tonsillar exudate.  Eyes:     Extraocular Movements: Extraocular movements intact.     Conjunctiva/sclera: Conjunctivae normal.  Cardiovascular:     Rate and Rhythm: Normal rate.  Pulmonary:     Effort: Pulmonary effort is normal.     Breath sounds: Normal  breath sounds.  Abdominal:     General: Abdomen is flat.     Palpations: Abdomen is soft.     Tenderness: There is no abdominal tenderness.  Musculoskeletal:        General: No swelling. Normal range of motion.     Cervical back: Neck supple.  Lymphadenopathy:     Cervical: Cervical adenopathy (mild) present.  Skin:    General: Skin is warm and dry.  Neurological:     General: No focal deficit present.     Mental Status: She is alert.  Psychiatric:        Mood and Affect: Mood normal.      ED Results / Procedures / Treatments   Labs (all labs ordered are listed, but only abnormal results are displayed) Labs Reviewed  COMPREHENSIVE METABOLIC PANEL - Abnormal; Notable for the following components:      Result Value   Glucose, Bld 116 (*)    All other components within normal limits  LACTIC ACID, PLASMA - Abnormal; Notable for the following components:   Lactic Acid, Venous 2.2 (*)    All other components within normal limits  GROUP A STREP BY PCR  SARS CORONAVIRUS 2 BY RT PCR (HOSPITAL ORDER, [facility])  LIPASE, BLOOD  CBC  LACTIC ACID, PLASMA  I-STAT BETA HCG BLOOD, ED ([facility], [facility], [facility] ONLY)    EKG None  Radiology No results found.  Procedures Procedures  Medications Ordered in the ED Medications  dexamethasone (DECADRON) injection 10 mg (has no administration in time range)  ondansetron (ZOFRAN-ODT) disintegrating tablet 4 mg (4 mg Oral Given 04/30/20 2046)  ibuprofen (ADVIL) tablet 800 mg (800 mg Oral Given 04/30/20 2217)     MDM Rules/Calculators/A&P MDM Patient noted to be febrile and tachycardic in triage. Labs ordered and reviewed, WBC was normal. CMP unremarkable. Lactic acid mildly elevated. Strep pending, added Covid. Ibuprofen ordered for continued fever.  ED Course  I have reviewed the triage vital signs and the nursing notes.  Pertinent labs & imaging results that were available during my care of the patient were reviewed  by me and considered in my medical decision making (see chart for details).  Clinical Course as of Apr 30 2314  Fri Apr 30, 2020  2243 Patient with SIRS criteria but most likely diagnosis is viral given recent exposure to Coxsackie.    [CS]  2244 Strep is neg.    [CS]  2308 Covid test is pending, if neg plan discharge home with supportive care for Coxsackie virus. She is tolerating PO fluid. Decadron for symptom improvement. Care signed out to Dr. Laverta Baltimore pending Covid test and dispo.    [CS]    Clinical Course User Index [  CS] Truddie Hidden, MD    Final Clinical Impression(s) / ED Diagnoses Final diagnoses:  None    Rx / DC Orders ED Discharge Orders    None       Truddie Hidden, MD 04/30/20 2315

## 2020-05-01 LAB — LACTIC ACID, PLASMA: Lactic Acid, Venous: 1.3 mmol/L (ref 0.5–1.9)

## 2020-05-01 NOTE — ED Notes (Signed)
Patient could not wait any longer due to her ride being presents. MD with a critical patient and patient is unable to wait. MD made aware.

## 2020-08-09 ENCOUNTER — Other Ambulatory Visit: Payer: Self-pay

## 2020-08-09 DIAGNOSIS — N644 Mastodynia: Secondary | ICD-10-CM

## 2020-08-09 DIAGNOSIS — N6452 Nipple discharge: Secondary | ICD-10-CM

## 2020-08-26 ENCOUNTER — Ambulatory Visit: Payer: Self-pay

## 2020-08-26 ENCOUNTER — Ambulatory Visit
Admission: RE | Admit: 2020-08-26 | Discharge: 2020-08-26 | Disposition: A | Discharge status: Home / Self Care | Payer: Self-pay | Source: Ambulatory Visit | Attending: Obstetrics and Gynecology | Admitting: Obstetrics and Gynecology

## 2020-08-26 ENCOUNTER — Ambulatory Visit: Payer: Self-pay | Admitting: *Deleted

## 2020-08-26 ENCOUNTER — Ambulatory Visit
Admission: RE | Admit: 2020-08-26 | Discharge: 2020-08-26 | Disposition: A | Discharge status: Home / Self Care | Payer: No Typology Code available for payment source | Source: Ambulatory Visit | Attending: Obstetrics and Gynecology | Admitting: Obstetrics and Gynecology

## 2020-08-26 ENCOUNTER — Other Ambulatory Visit: Payer: Self-pay

## 2020-08-26 ENCOUNTER — Other Ambulatory Visit (HOSPITAL_COMMUNITY)
Admission: RE | Admit: 2020-08-26 | Discharge: 2020-08-26 | Disposition: A | Discharge status: Home / Self Care | Payer: No Typology Code available for payment source | Source: Ambulatory Visit | Attending: Obstetrics and Gynecology | Admitting: Obstetrics and Gynecology

## 2020-08-26 VITALS — BP 130/88 | Wt 201.4 lb

## 2020-08-26 DIAGNOSIS — N6452 Nipple discharge: Secondary | ICD-10-CM

## 2020-08-26 DIAGNOSIS — N644 Mastodynia: Secondary | ICD-10-CM

## 2020-08-26 DIAGNOSIS — N6321 Unspecified lump in the left breast, upper outer quadrant: Secondary | ICD-10-CM

## 2020-08-26 DIAGNOSIS — Z1239 Encounter for other screening for malignant neoplasm of breast: Secondary | ICD-10-CM

## 2020-08-26 NOTE — Patient Instructions (Signed)
Explained breast self awareness with Tracey Beard. Patient did not need a Pap smear today due to last Pap smear was in December 2019 per patient. Let her know [facility] will cover Pap smears every 3 years unless has a history of abnormal Pap smears. Referred patient to the [facility] for a diagnostic mammogram. Appointment scheduled Thursday, August 26, 2020 at 1520. Patient aware of appointment and will be there. Let patient know will follow up with her within the next couple weeks with results of breast discharge by letter or phone. Tracey Beard verbalized understanding.  Tracey Beard, Tracey Chaco, RN 1:59 PM

## 2020-08-26 NOTE — Progress Notes (Signed)
[name] is a 39 y.o. female who presents to [facility] clinic today with complaint of bilateral upper breast pain, bilateral breast lumps, and bilateral milky discharge. Patient stated the pain comes and goes. Patient rates the pain at a 7 out of 10 within the left breast and 4-5 out of 10 within the right breast. Patient stated she quit breastfeeding in May 2021 and discharge looks like milk when expressed. Patient stated she did notice one time the left breast discharge was clear colored.    Pap Smear: Pap smear not completed today. Last Pap smear was in December 2019 at [facility] and was normal per patient. Per patient has no history of an abnormal Pap smear. Last Pap smear result is not available in Epic.   Physical exam: Breasts Breasts symmetrical. No skin abnormalities bilateral breasts. No nipple retraction bilateral breasts. Bilateral milky to yellowish colored discharge expressed on exam. Sample of discharge from both breasts sent to Cytology for evaluation. No lymphadenopathy. No lumps palpated right breast. Palpated a bb sized lump within the left breast at 2 o'clock 5 cm from the nipple. Complaints of bilateral breast pain on exam.      Pelvic/Bimanual Pap is not indicated today per [facility] guidelines.   Smoking History: Patient has never smoked.   Patient Navigation: Patient education provided. Access to services provided for patient through [facility] program.    Breast and Cervical Cancer Risk Assessment: Patient has family history of mother and a maternal aunt having breast cancer. Patient has no known genetic mutations or history of radiation treatment to the chest before age 41. Patient does not have history of cervical dysplasia, immunocompromised, or DES exposure in-utero.  Risk Assessment    Risk Scores      08/26/2020   Last edited by: Demetrius Revel, LPN   5-year risk: 0.7 %   Lifetime risk: 15.1 %          A: [facility] exam without pap  smear Complaint of bilateral breast pain, lumps, and discharge.  P: Referred patient to the [facility] for a diagnostic mammogram. Appointment scheduled Thursday, August 26, 2020 at 1520.  Tracey Parish, RN 08/26/2020 1:59 PM

## 2020-08-27 LAB — CYTOLOGY - NON PAP

## 2020-08-30 ENCOUNTER — Telehealth: Payer: Self-pay

## 2020-08-30 NOTE — Telephone Encounter (Signed)
Patient informed pathology report for bilateral breast discharge results, No malignant cells seen. Patient was relieved, verbalized understanding.

## 2021-02-22 ENCOUNTER — Telehealth: Payer: Self-pay | Admitting: Internal Medicine

## 2021-02-22 ENCOUNTER — Encounter: Payer: Self-pay | Admitting: Gastroenterology

## 2021-02-22 NOTE — Telephone Encounter (Signed)
I am happy to take over her care.  Please offer her my first available new GI appointment.  Do not double book.  I see that she is already on schedule for an appointment with Janett Billow in about 1 month.  If she prefers to keep that appointment that is okay with me.

## 2021-02-22 NOTE — Telephone Encounter (Signed)
Thank you. Patient decided to keep her appointment with Janett Billow since it is sooner.

## 2021-02-22 NOTE — Telephone Encounter (Signed)
Hey Dr. Hilarie Fredrickson,   Patient called in with abd pain, nausea, vomitting previous being seen by you in 2018. She have a referral from her PCP that requested her to be seen by Dr. Ardis Hughs and the patients agrees she wants to see Dr. Ardis Hughs as well. Could you please advise on switching providers?

## 2021-02-22 NOTE — Telephone Encounter (Signed)
Ok with me, will cc: dr. Ardis Hughs

## 2021-03-23 ENCOUNTER — Encounter: Payer: Self-pay | Admitting: *Deleted

## 2021-03-23 ENCOUNTER — Other Ambulatory Visit: Payer: Self-pay | Admitting: *Deleted

## 2021-03-24 ENCOUNTER — Other Ambulatory Visit (INDEPENDENT_AMBULATORY_CARE_PROVIDER_SITE_OTHER): Payer: 59

## 2021-03-24 ENCOUNTER — Other Ambulatory Visit: Payer: Self-pay | Admitting: Gastroenterology

## 2021-03-24 ENCOUNTER — Encounter: Payer: Self-pay | Admitting: Gastroenterology

## 2021-03-24 ENCOUNTER — Ambulatory Visit (INDEPENDENT_AMBULATORY_CARE_PROVIDER_SITE_OTHER): Payer: 59 | Admitting: Gastroenterology

## 2021-03-24 VITALS — BP 122/84 | HR 101 | Ht 61.0 in | Wt 189.0 lb

## 2021-03-24 DIAGNOSIS — R112 Nausea with vomiting, unspecified: Secondary | ICD-10-CM

## 2021-03-24 DIAGNOSIS — R1033 Periumbilical pain: Secondary | ICD-10-CM

## 2021-03-24 DIAGNOSIS — K3184 Gastroparesis: Secondary | ICD-10-CM | POA: Diagnosis not present

## 2021-03-24 DIAGNOSIS — R194 Change in bowel habit: Secondary | ICD-10-CM | POA: Diagnosis not present

## 2021-03-24 LAB — BASIC METABOLIC PANEL
BUN: 18 mg/dL (ref 6–23)
CO2: 26 mEq/L (ref 19–32)
Calcium: 10 mg/dL (ref 8.4–10.5)
Chloride: 98 mEq/L (ref 96–112)
Creatinine, Ser: 0.95 mg/dL (ref 0.40–1.20)
GFR: 76.28 mL/min (ref >60.00)
Glucose, Bld: 117 mg/dL — ABNORMAL HIGH (ref 70–99)
Potassium: 3.3 mEq/L — ABNORMAL LOW (ref 3.5–5.1)
Sodium: 137 mEq/L (ref 135–145)

## 2021-03-24 MED ORDER — ESOMEPRAZOLE MAGNESIUM 40 MG PO CPDR: 40.0000 mg | DELAYED_RELEASE_CAPSULE | Freq: Two times a day (BID) | ORAL | 5 refills | Status: AC

## 2021-03-24 MED ORDER — METOCLOPRAMIDE HCL 5 MG PO TABS: 5.0000 mg | ORAL_TABLET | Freq: Four times a day (QID) | ORAL | 1 refills | Status: DC

## 2021-03-24 NOTE — Progress Notes (Signed)
03/24/2021 Tracey Beard [id number] 1981-03-12   HISTORY OF PRESENT ILLNESS: This is a 40 year old female who is previously a patient Dr. Vena Rua, but is switching her care to Dr. Ardis Hughs.  This was approved by both physicians.  She is here again today with complaints of severe heartburn/reflux and frequent nausea and vomiting.   EGD 06/2017 showed the following:   - Normal esophagus. - A large amount of food (residue) in the stomach consistent with gastroparesis. - Normal mucosa was found in the entire stomach. Biopsied. - Normal examined duodenum.  Biopsy showed mild chronic gastritis, negative for H. pylori.  By these findings it was suspected that she had gastroparesis and was previously placed on Reglan.  She began right away taking 10 mg 4 times daily and felt like that caused her more issues with diarrhea.  When she was last seen here in November 2018 I had asked her to take 5 mg 4 times daily, before meals and at bedtime, but then she never followed up after that.  She says that her heartburn and reflux had been so severe recently.  She had not been on her Nexium so she started taking that again once a day and that has helped a significant amount.  She says that if she misses it at all then her symptoms are awful.  She says that when she eats she feels like it just sits in her stomach and then she throws up.  She said even just drinking stuff sometimes will cause her to vomit.  She also describes a sensation in her right mid abdomen that has been occurring for the past few months.  She says that it feels like her intestines are twisting.  She says that when she puts her hand there that she can actually feel something moving.  She says that it lasts only about a minute at a time.  Says it comes on randomly.  She reports that her bowel movements are always like diarrhea, never have any form to them.  She will sometimes go 3 days at a time, however, without a bowel  movement.  Colonoscopy 06/2017 was normal with normal random biopsies.  She reports having another colonoscopy previously as well at a different facility.  Referred back here on this occasion by Dr. Helane Rima.   Past Medical History:  Diagnosis Date   Anxiety    Chronic back pain    Depression    Gestational diabetes    glyburide   Headache    Hypertension    Idiopathic intracranial hypertension    Leukocytosis 02/25/2015   Obesity    Pregnancy induced hypertension    labetalol   Past Surgical History:  Procedure Laterality Date   CESAREAN SECTION N/A 01/29/2019   Procedure: CESAREAN SECTION;  Surgeon: [name], DO;  Location: [facility];  Service: Obstetrics;  Laterality: N/A;   CHOLECYSTECTOMY     LUMBAR PUNCTURE  2014   5x    reports that she has never smoked. She has never used smokeless tobacco. She reports current alcohol use. She reports that she does not use drugs. family history includes Breast cancer in her maternal aunt and mother; Colon cancer in her paternal grandmother; Diabetes in her maternal aunt, maternal grandfather, and mother; Heart disease in her maternal grandmother and paternal grandfather; Hypertension in her father, maternal aunt, maternal grandfather, maternal grandmother, maternal uncle, and mother; Liver cancer in her maternal uncle; 34 / [place] in her daughter; Pancreatic cancer in  her mother; Prostate cancer in her maternal uncle. Allergies  Allergen Reactions   Elavil [Amitriptyline Hcl] Shortness Of Breath   Restoril [Temazepam] Shortness Of Breath   Trazodone And Nefazodone Anaphylaxis   Diamox [Acetazolamide] Other (See Comments)    Reaction:  Cold chills/loss of taste    Tomato Swelling    (Lips)   Maxalt [Rizatriptan Benzoate] Other (See Comments)    Reaction:  Chest tightness    Pollen Extract     Other reaction(s): Other (See Comments)   Benzocaine Swelling, Rash and Other (See Comments)    Reaction:  Localized  swelling   Latex Rash   Novocain [Procaine] Swelling, Rash and Other (See Comments)    Reaction:  Localized swelling      Outpatient Encounter Medications as of 03/24/2021  Medication Sig   cyclobenzaprine (FLEXERIL) 10 MG tablet Take 10 mg by mouth 3 (three) times daily.   esomeprazole (NEXIUM) 40 MG capsule Take 40 mg by mouth daily at 12 noon.   ibuprofen (ADVIL) 800 MG tablet Take 1 tablet (800 mg total) by mouth every 6 (six) hours as needed for moderate pain.   lansoprazole (PREVACID) 30 MG capsule Take 30 mg by mouth daily at 12 noon.   ondansetron (ZOFRAN-ODT) 8 MG disintegrating tablet Take 1 tablet by mouth every 8 (eight) hours as needed.   oxyCODONE-acetaminophen (PERCOCET/ROXICET) 5-325 MG tablet Take 1-2 tablets by mouth every 4 (four) hours as needed for moderate pain.   triamterene-hydrochlorothiazide (MAXZIDE) 75-50 MG tablet Take 1 tablet by mouth daily.   [DISCONTINUED] HYDROcodone-acetaminophen (NORCO/VICODIN) 5-325 MG tablet Take 1-2 tablets by mouth every 4 (four) hours as needed.   [DISCONTINUED] Prenatal Vit-Fe Fumarate-FA (PRENATAL MULTIVITAMIN) TABS tablet Take 1 tablet by mouth daily at 12 noon.   [DISCONTINUED] triazolam (HALCION) 0.25 MG tablet SMARTSIG:2 Tablet(s) By Mouth   [DISCONTINUED] vitamin E 180 MG (400 UNITS) capsule Take 400 Units by mouth daily.   No facility-administered encounter medications on file as of 03/24/2021.    REVIEW OF SYSTEMS  : All other systems reviewed and negative except where noted in the History of Present Illness.   PHYSICAL EXAM: BP 122/84   Pulse (!) 101   Ht 5\' 1"  (1.549 m)   Wt 189 lb (85.7 kg)   BMI 35.71 kg/m  General: Well developed white female in no acute distress Head: Normocephalic and atraumatic Eyes:  Sclerae anicteric, conjunctiva pink. Ears: Normal auditory acuity Lungs: Clear throughout to auscultation; no W/R/R. Heart: Regular rate and rhythm; no M/R/G. Abdomen: Soft, non-distended.  BS present.   Non-tender. Musculoskeletal: Symmetrical with no gross deformities  Skin: No lesions on visible extremities Extremities: No edema  Neurological: Alert oriented x 4, grossly non-focal Psychological:  Alert and cooperative. Normal mood and affect  ASSESSMENT AND PLAN: *Nausea and vomiting with severe GERD and suspected gastroparesis: Had retained food contents in her stomach on EGD in 2018.  Previously felt like Reglan 10 mg 4 times daily helped her nausea, but worsened her diarrhea.  I had recommended decreasing the dose to 5 mg 4 times a day, but then she was lost to follow-up after that.  She is willing to try the Reglan 5 mg before meals and at bedtime.  She will increase her Nexium to 40 mg twice daily for now.  Prescriptions sent to pharmacy.  We discussed gastroparesis diet and she was given literature on this.  Mostly small frequent meals and avoiding eating late at night. *Altered bowel habits: Has mostly diarrhea/watery  stools, but does go a few days at a time without a bowel movement sometimes.  She does not have a gallbladder so component of it may be bile salt related.  Also suspected previously to maybe have a component of IBS.  Questran may be an option for her, but afraid that might bind her up too much.  We will first start with Benefiber 2 teaspoons mixed in 8 ounces of liquid daily.  She has had two colonoscopies, the last in 2018 that was completely normal with normal random biopsies.  The other question would be is she more underlyingly constipated and then we should be more aggressively treating her for constipation to try to prevent these cathartic diarrheal bowel movements that she is experiencing. *Periumbilical abdominal pain: She describes sensation that feels like her intestines are twisting.  This occurs randomly and is very transient.  Not sure what this represents.  We can start by doing a CT scan of the abdomen and pelvis with contrast to see if that suggests any  issues.  **She will follow-up with me in about 4 to 6 weeks and then we will schedule her to see Dr. Ardis Hughs in October as well that way we have an appointment in place with him.   CC:  Dian Queen, MD

## 2021-03-24 NOTE — Patient Instructions (Signed)
Increase Nexium to 40 mg twice daily.  Start Reglan 5 mg before meals and at bedtime.   Start Benefiber 2 teaspoons in 8 ounces of liquid daily.  Eat small frequent meal/gastroparesis diet handout provided.  Your provider has requested that you go to the basement level for lab work before leaving today. Press "B" on the elevator. The lab is located at the first door on the left as you exit the elevator.  You have been scheduled for a CT scan of the abdomen and pelvis at [facility] ([address]---this is in the same building as [employer]).   You are scheduled on Thursday 03/31/21 at 10:30 am. You should arrive 15 minutes prior to your appointment time for registration. Please follow the written instructions below on the day of your exam:  WARNING: IF YOU ARE ALLERGIC TO IODINE/X-RAY DYE, PLEASE NOTIFY RADIOLOGY IMMEDIATELY AT [phone]! YOU WILL BE GIVEN A 13 HOUR PREMEDICATION PREP.  1) Do not eat or drink anything after 6:30 am (4 hours prior to your test) 2) You have been given 2 bottles of oral contrast to drink. The solution may taste better if refrigerated, but do NOT add ice or any other liquid to this solution. Shake well before drinking.    Drink 1 bottle of contrast @ 8:30 am (2 hours prior to your exam)  Drink 1 bottle of contrast @ 9:30 am (1 hour prior to your exam)  You may take any medications as prescribed with a small amount of water, if necessary. If you take any of the following medications: METFORMIN, GLUCOPHAGE, GLUCOVANCE, AVANDAMET, RIOMET, FORTAMET, [place] MET, JANUMET, GLUMETZA or METAGLIP, you MAY be asked to HOLD this medication 48 hours AFTER the exam.  The purpose of you drinking the oral contrast is to aid in the visualization of your intestinal tract. The contrast solution may cause some diarrhea. Depending on your individual set of symptoms, you may also receive an intravenous injection of x-ray contrast/dye. Plan on being at  [facility] for 30 minutes or longer, depending on the type of exam you are having performed.  This test typically takes 30-45 minutes to complete.  If you have any questions regarding your exam or if you need to reschedule, you may call the CT department at [phone] between the hours of 8:00 am and 5:00 pm, Monday-Friday.  _____________________________________________________________

## 2021-03-25 NOTE — Progress Notes (Signed)
I agree with the above note, plan 

## 2021-03-31 ENCOUNTER — Telehealth: Payer: Self-pay | Admitting: *Deleted

## 2021-03-31 ENCOUNTER — Inpatient Hospital Stay: Admission: RE | Admit: 2021-03-31 | Payer: 59 | Source: Ambulatory Visit

## 2021-03-31 NOTE — Telephone Encounter (Signed)
error 

## 2021-04-01 ENCOUNTER — Telehealth: Payer: Self-pay | Admitting: *Deleted

## 2021-04-01 NOTE — Telephone Encounter (Signed)
Submitted PA for Nexium via [employer]

## 2021-04-08 ENCOUNTER — Inpatient Hospital Stay: Admission: RE | Admit: 2021-04-08 | Payer: 59 | Source: Ambulatory Visit

## 2021-04-11 ENCOUNTER — Inpatient Hospital Stay: Admission: RE | Admit: 2021-04-11 | Payer: 59 | Source: Ambulatory Visit

## 2021-04-15 ENCOUNTER — Inpatient Hospital Stay: Admission: RE | Admit: 2021-04-15 | Payer: 59 | Source: Ambulatory Visit

## 2021-04-16 ENCOUNTER — Emergency Department (HOSPITAL_BASED_OUTPATIENT_CLINIC_OR_DEPARTMENT_OTHER)
Admission: EM | Admit: 2021-04-16 | Discharge: 2021-04-17 | Disposition: A | Discharge status: Home / Self Care | Payer: 59 | Attending: Emergency Medicine | Admitting: Emergency Medicine

## 2021-04-16 ENCOUNTER — Other Ambulatory Visit: Payer: Self-pay

## 2021-04-16 DIAGNOSIS — Z20822 Contact with and (suspected) exposure to covid-19: Secondary | ICD-10-CM | POA: Diagnosis not present

## 2021-04-16 DIAGNOSIS — Z91040 Latex allergy status: Secondary | ICD-10-CM | POA: Insufficient documentation

## 2021-04-16 DIAGNOSIS — B349 Viral infection, unspecified: Secondary | ICD-10-CM | POA: Insufficient documentation

## 2021-04-16 DIAGNOSIS — Z79899 Other long term (current) drug therapy: Secondary | ICD-10-CM | POA: Insufficient documentation

## 2021-04-16 DIAGNOSIS — R509 Fever, unspecified: Secondary | ICD-10-CM | POA: Diagnosis present

## 2021-04-16 DIAGNOSIS — R5383 Other fatigue: Secondary | ICD-10-CM | POA: Insufficient documentation

## 2021-04-16 DIAGNOSIS — I10 Essential (primary) hypertension: Secondary | ICD-10-CM | POA: Insufficient documentation

## 2021-04-16 DIAGNOSIS — R0981 Nasal congestion: Secondary | ICD-10-CM | POA: Diagnosis not present

## 2021-04-16 DIAGNOSIS — J3489 Other specified disorders of nose and nasal sinuses: Secondary | ICD-10-CM | POA: Diagnosis not present

## 2021-04-16 LAB — RESP PANEL BY RT-PCR (FLU A&B, COVID) ARPGX2
Influenza A by PCR: NEGATIVE
Influenza B by PCR: NEGATIVE
SARS Coronavirus 2 by RT PCR: NEGATIVE

## 2021-04-16 NOTE — ED Triage Notes (Signed)
Pt presents to ED POV. Pt c/o generalized body aches and sore throat. Pt also c/o increased discharge from eyes. No known sick conacts. Pt is vaccinated for covid

## 2021-04-17 MED ORDER — KETOROLAC TROMETHAMINE 60 MG/2ML IM SOLN
30.0000 mg | Freq: Once | INTRAMUSCULAR | Status: AC
  Administered 2021-04-17: 30 mg via INTRAMUSCULAR
  Filled 2021-04-17: qty 2

## 2021-04-17 MED ORDER — PROCHLORPERAZINE EDISYLATE 10 MG/2ML IJ SOLN
10.0000 mg | Freq: Once | INTRAMUSCULAR | Status: AC
  Administered 2021-04-17: 10 mg via INTRAMUSCULAR
  Filled 2021-04-17: qty 2

## 2021-04-17 MED ORDER — ONDANSETRON 4 MG PO TBDP: 4.0000 mg | ORAL_TABLET | Freq: Three times a day (TID) | ORAL | 0 refills | Status: AC | PRN

## 2021-04-17 MED ORDER — ONDANSETRON 4 MG PO TBDP: 4.0000 mg | ORAL_TABLET | Freq: Once | ORAL | Status: DC

## 2021-04-17 NOTE — ED Provider Notes (Signed)
[facility] EMERGENCY DEPT Provider Note  CSN: [id number] Arrival date & time: 04/16/21 1901  Chief Complaint(s) Generalized Body Aches  HPI Tracey Beard is a 40 y.o. female    Influenza Presenting symptoms: cough, fatigue, fever, myalgias, nausea, rhinorrhea and sore throat   Severity:  Moderate Onset quality:  Gradual Duration:  3 days Progression:  Worsening Chronicity:  New Relieved by:  Nothing Worsened by:  Nothing Ineffective treatments:  OTC medications Associated symptoms: nasal congestion   Associated symptoms comment:  Pink eye Risk factors: sick contacts (at home)    Past Medical History Past Medical History:  Diagnosis Date   Anxiety    Chronic back pain    Depression    Gestational diabetes    glyburide   Headache    Hypertension    Idiopathic intracranial hypertension    Leukocytosis 02/25/2015   Obesity    Pregnancy induced hypertension    labetalol   Patient Active Problem List   Diagnosis Date Noted   Periumbilical abdominal pain 03/24/2021   Altered bowel habits 03/24/2021   Gestational hypertension 01/29/2019   S/P cesarean section 01/29/2019   Abnormal glucose tolerance test (GTT) during pregnancy, antepartum 12/06/2018   Gastroparesis 07/25/2017   Nausea and vomiting 06/18/2017   Generalized abdominal pain 06/18/2017   Diarrhea 06/18/2017   IUFD at 20 weeks or more of gestation 11/01/2016   Borderline diabetes mellitus 08/25/2015   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 Authorizing Provider  ondansetron (ZOFRAN ODT) 4 MG disintegrating tablet  Take 1 tablet (4 mg total) by mouth every 8 (eight) hours as needed for up to 3 days for nausea or vomiting. 04/17/21 04/20/21 Yes Jowanna Loeffler, Grayce Sessions, MD  cyclobenzaprine (FLEXERIL) 10 MG tablet Take 10 mg by mouth 3 (three) times daily. 03/04/21   [provider]  esomeprazole (NEXIUM) 40 MG capsule Take 1 capsule (40 mg total) by mouth 2 (two) times daily before a meal. 03/24/21   Zehr, Janett Billow D, PA-C  ibuprofen (ADVIL) 800 MG tablet Take 1 tablet (800 mg total) by mouth every 6 (six) hours as needed for moderate pain. 02/02/19   Louretta Shorten, MD  metoCLOPramide (REGLAN) 5 MG tablet Take 1 tablet (5 mg total) by mouth 4 (four) times daily. 03/24/21   Zehr, Laban Emperor, PA-C  oxyCODONE-acetaminophen (PERCOCET/ROXICET) 5-325 MG tablet Take 1-2 tablets by mouth every 4 (four) hours as needed for moderate pain. 02/02/19   Louretta Shorten, MD  triamterene-hydrochlorothiazide (MAXZIDE) 75-50 MG tablet Take 1 tablet by mouth daily. 01/18/21   [provider]  Past Surgical History Past Surgical History:  Procedure Laterality Date   CESAREAN SECTION N/A 01/29/2019   Procedure: CESAREAN SECTION;  Surgeon: [name], DO;  Location: [facility] LD ORS;  Service: Obstetrics;  Laterality: N/A;   CHOLECYSTECTOMY     LUMBAR PUNCTURE  2014   5x   Family History Family History  Problem Relation Age of Onset   Hypertension Mother    Breast cancer Mother    Pancreatic cancer Mother    Diabetes Mother    Hypertension Father    Hypertension Maternal Grandmother    Heart disease Maternal Grandmother    Hypertension Maternal Grandfather    Diabetes Maternal Grandfather    Colon cancer Paternal Grandmother    Heart disease Paternal Grandfather    Hypertension Maternal Aunt    Breast cancer Maternal Aunt    Diabetes Maternal Aunt    Hypertension Maternal Uncle    Liver cancer  Maternal Uncle    Prostate cancer Maternal Uncle    Miscarriages / [place] Daughter     Social History Social History   Tobacco Use   Smoking status: Never   Smokeless tobacco: Never  Vaping Use   Vaping Use: Never used  Substance Use Topics   Alcohol use: Yes    Comment: socially   Drug use: No   Allergies Elavil [amitriptyline hcl], Restoril [temazepam], Trazodone and nefazodone, Diamox [acetazolamide], Tomato, Maxalt [rizatriptan benzoate], Pollen extract, Benzocaine, Latex, and Novocain [procaine]  Review of Systems Review of Systems  Constitutional:  Positive for fatigue and fever.  HENT:  Positive for congestion, rhinorrhea and sore throat.   Respiratory:  Positive for cough.   Gastrointestinal:  Positive for nausea.  Musculoskeletal:  Positive for myalgias.  All other systems are reviewed and are negative for acute change except as noted in the HPI  Physical Exam Vital Signs  I have reviewed the triage vital signs BP (!) 147/95 (BP Location: Right Arm)   Pulse (!) 107   Temp 99.8 F (37.7 C) (Oral)   Resp 20   Ht 5' (1.524 m)   Wt 81.6 kg   LMP 03/21/2021   SpO2 100%   BMI 35.15 kg/m   Physical Exam Vitals reviewed.  Constitutional:      General: She is not in acute distress.    Appearance: She is well-developed. She is not diaphoretic.  HENT:     Head: Normocephalic and atraumatic.     Right Ear: Tympanic membrane normal.     Left Ear: Tympanic membrane normal.     Nose: Nose normal.     Mouth/Throat:     Pharynx: Oropharynx is clear.  Eyes:     General: No scleral icterus.       Right eye: No discharge.        Left eye: No discharge.     Conjunctiva/sclera:     Right eye: Right conjunctiva is injected. No exudate.    Left eye: Left conjunctiva is injected. No exudate.    Pupils: Pupils are equal, round, and reactive to light.  Cardiovascular:     Rate and Rhythm: Normal rate and regular rhythm.     Heart sounds: No murmur heard.   No  friction rub. No gallop.  Pulmonary:     Effort: Pulmonary effort is normal. No respiratory distress.     Breath sounds: Normal breath sounds. No stridor. No rales.  Abdominal:     General: There is no distension.     Palpations: Abdomen is soft.  Tenderness: There is no abdominal tenderness.  Musculoskeletal:        General: No tenderness.     Cervical back: Normal range of motion and neck supple.  Skin:    General: Skin is warm and dry.     Findings: No erythema or rash.  Neurological:     Mental Status: She is alert and oriented to person, place, and time.    ED Results and Treatments Labs (all labs ordered are listed, but only abnormal results are displayed) Labs Reviewed  RESP PANEL BY RT-PCR (FLU A&B, COVID) [id number]                                                                                                                         EKG  EKG Interpretation  Date/Time:    Ventricular Rate:    PR Interval:    QRS Duration:   QT Interval:    QTC Calculation:   R Axis:     Text Interpretation:         Radiology No results found.  Pertinent labs & imaging results that were available during my care of the patient were reviewed by me and considered in my medical decision making (see chart for details).  Medications Ordered in ED Medications  ketorolac (TORADOL) injection 30 mg (has no administration in time range)  prochlorperazine (COMPAZINE) injection 10 mg (has no administration in time range)                                                                                                                                    Procedures Procedures  (including critical care time)  Medical Decision Making / ED Course I have reviewed the nursing notes for this encounter and the patient's prior records (if available in EHR or on provided paperwork).   Tracey Beard was evaluated in Emergency Department on 04/17/2021 for the symptoms described in the history of  present illness. She was evaluated in the context of the global COVID-19 pandemic, which necessitated consideration that the patient might be at risk for infection with the SARS-CoV-2 virus that causes COVID-19. Institutional protocols and algorithms that pertain to the evaluation of patients at risk for COVID-19 are in a state of rapid change based on information released by regulatory bodies including the CDC and federal and state organizations. These policies and algorithms were followed during the patient's care in the  ED.  Patient presents with viral symptoms for 3 days. Adequate oral hydration. Rest of history as above.  Patient appears well. No signs of toxicity, patient is interactive. No hypoxia, tachypnea or other signs of respiratory distress. No sign of clinical dehydration. Lung exam clear. Rest of exam as above.  Most consistent with viral illness  COVID/Flu negative  No evidence suggestive of pharyngitis, AOM, PNA, or meningitis.  Chest x-ray not indicated at this time.  Discussed symptomatic treatment with the patient and they will follow closely with their PCP.        Final Clinical Impression(s) / ED Diagnoses Final diagnoses:  Acute viral syndrome   The patient appears reasonably screened and/or stabilized for discharge and I doubt any other medical condition or other [facility] requiring further screening, evaluation, or treatment in the ED at this time prior to discharge. Safe for discharge with strict return precautions.  Disposition: Discharge  Condition: Good  I have discussed the results, Dx and Tx plan with the patient/family who expressed understanding and agree(s) with the plan. Discharge instructions discussed at length. The patient/family was given strict return precautions who verbalized understanding of the instructions. No further questions at time of discharge.    ED Discharge Orders          Ordered    ondansetron (ZOFRAN ODT) 4 MG disintegrating tablet   Every 8 hours PRN        04/17/21 0022            Follow Up: Dian Queen, MD [place] [id number] [phone]  Call  as needed      This chart was dictated using voice recognition software.  Despite best efforts to proofread,  errors can occur which can change the documentation meaning.    Fatima Blank, MD 04/17/21 [phone]

## 2021-04-26 ENCOUNTER — Telehealth: Payer: Self-pay | Admitting: *Deleted

## 2021-04-26 NOTE — Telephone Encounter (Signed)
Left message for patient to call office.  

## 2021-04-26 NOTE — Telephone Encounter (Signed)
Left message for patient to  call office. Patient needs blood test before her CT scan this week.

## 2021-04-29 ENCOUNTER — Inpatient Hospital Stay: Admission: RE | Admit: 2021-04-29 | Payer: 59 | Source: Ambulatory Visit

## 2021-05-04 ENCOUNTER — Ambulatory Visit: Payer: Self-pay | Admitting: Gastroenterology

## 2021-05-04 NOTE — Telephone Encounter (Signed)
Patient canceled CT scan 4x since last visit. She is suppose to come in today to see you. FYI.

## 2021-06-21 ENCOUNTER — Ambulatory Visit: Payer: Self-pay | Admitting: Gastroenterology

## 2022-01-20 LAB — HM PAP SMEAR

## 2022-05-14 ENCOUNTER — Emergency Department (HOSPITAL_COMMUNITY): Payer: Self-pay

## 2022-05-14 ENCOUNTER — Inpatient Hospital Stay (HOSPITAL_COMMUNITY)
Admission: EM | Admit: 2022-05-14 | Discharge: 2022-05-16 | DRG: 089 | Disposition: A | Discharge status: Home / Self Care | Payer: Self-pay | Attending: Internal Medicine | Admitting: Internal Medicine

## 2022-05-14 DIAGNOSIS — Z833 Family history of diabetes mellitus: Secondary | ICD-10-CM

## 2022-05-14 DIAGNOSIS — S82201A Unspecified fracture of shaft of right tibia, initial encounter for closed fracture: Secondary | ICD-10-CM | POA: Diagnosis present

## 2022-05-14 DIAGNOSIS — F419 Anxiety disorder, unspecified: Secondary | ICD-10-CM | POA: Diagnosis present

## 2022-05-14 DIAGNOSIS — S0990XA Unspecified injury of head, initial encounter: Principal | ICD-10-CM

## 2022-05-14 DIAGNOSIS — R4182 Altered mental status, unspecified: Secondary | ICD-10-CM

## 2022-05-14 DIAGNOSIS — F32A Depression, unspecified: Secondary | ICD-10-CM | POA: Diagnosis present

## 2022-05-14 DIAGNOSIS — E669 Obesity, unspecified: Secondary | ICD-10-CM | POA: Diagnosis present

## 2022-05-14 DIAGNOSIS — R413 Other amnesia: Secondary | ICD-10-CM | POA: Diagnosis present

## 2022-05-14 DIAGNOSIS — M549 Dorsalgia, unspecified: Secondary | ICD-10-CM | POA: Diagnosis present

## 2022-05-14 DIAGNOSIS — Z8249 Family history of ischemic heart disease and other diseases of the circulatory system: Secondary | ICD-10-CM

## 2022-05-14 DIAGNOSIS — Z5329 Procedure and treatment not carried out because of patient's decision for other reasons: Secondary | ICD-10-CM | POA: Diagnosis present

## 2022-05-14 DIAGNOSIS — W19XXXA Unspecified fall, initial encounter: Secondary | ICD-10-CM | POA: Diagnosis present

## 2022-05-14 DIAGNOSIS — S060XAA Concussion with loss of consciousness status unknown, initial encounter: Principal | ICD-10-CM | POA: Diagnosis present

## 2022-05-14 DIAGNOSIS — Z91040 Latex allergy status: Secondary | ICD-10-CM

## 2022-05-14 DIAGNOSIS — M25471 Effusion, right ankle: Secondary | ICD-10-CM | POA: Diagnosis present

## 2022-05-14 DIAGNOSIS — Z8632 Personal history of gestational diabetes: Secondary | ICD-10-CM

## 2022-05-14 DIAGNOSIS — Z888 Allergy status to other drugs, medicaments and biological substances status: Secondary | ICD-10-CM

## 2022-05-14 DIAGNOSIS — M25571 Pain in right ankle and joints of right foot: Secondary | ICD-10-CM | POA: Diagnosis present

## 2022-05-14 DIAGNOSIS — Z91018 Allergy to other foods: Secondary | ICD-10-CM

## 2022-05-14 DIAGNOSIS — Z9049 Acquired absence of other specified parts of digestive tract: Secondary | ICD-10-CM

## 2022-05-14 DIAGNOSIS — G932 Benign intracranial hypertension: Secondary | ICD-10-CM | POA: Diagnosis present

## 2022-05-14 DIAGNOSIS — R7303 Prediabetes: Secondary | ICD-10-CM | POA: Diagnosis present

## 2022-05-14 DIAGNOSIS — Z79899 Other long term (current) drug therapy: Secondary | ICD-10-CM

## 2022-05-14 DIAGNOSIS — J301 Allergic rhinitis due to pollen: Secondary | ICD-10-CM | POA: Diagnosis present

## 2022-05-14 DIAGNOSIS — G8929 Other chronic pain: Secondary | ICD-10-CM | POA: Diagnosis present

## 2022-05-14 DIAGNOSIS — K3184 Gastroparesis: Secondary | ICD-10-CM | POA: Diagnosis present

## 2022-05-14 DIAGNOSIS — Z6835 Body mass index (BMI) 35.0-35.9, adult: Secondary | ICD-10-CM

## 2022-05-14 DIAGNOSIS — I10 Essential (primary) hypertension: Secondary | ICD-10-CM | POA: Diagnosis present

## 2022-05-14 DIAGNOSIS — E1143 Type 2 diabetes mellitus with diabetic autonomic (poly)neuropathy: Secondary | ICD-10-CM | POA: Diagnosis present

## 2022-05-14 DIAGNOSIS — Z635 Disruption of family by separation and divorce: Secondary | ICD-10-CM

## 2022-05-14 MED ORDER — OXYCODONE-ACETAMINOPHEN 5-325 MG PO TABS
1.0000 | ORAL_TABLET | Freq: Once | ORAL | Status: AC
  Administered 2022-05-14: 1 via ORAL
  Filled 2022-05-14: qty 1

## 2022-05-14 MED ORDER — ONDANSETRON 8 MG PO TBDP
8.0000 mg | ORAL_TABLET | Freq: Once | ORAL | Status: AC
  Administered 2022-05-14: 8 mg via ORAL
  Filled 2022-05-14: qty 1

## 2022-05-14 NOTE — ED Triage Notes (Signed)
Pt arrived via EMS, from home, c/o right ankle pain/swelling, twisted it earlier today.

## 2022-05-14 NOTE — ED Notes (Addendum)
Pt complaining of a headache, states that she hit her head when she fell and is currently feeling dizzy. Pt requesting meds for headache. Provider made aware

## 2022-05-14 NOTE — ED Provider Triage Note (Signed)
Emergency Medicine Provider Triage Evaluation Note  Tracey Beard , a 41 y.o. female  was evaluated in triage.  Pt complains of right ankle pain. States that she fell earlier and twisted it. No other injuries  Review of Systems  Positive:  Negative:   Physical Exam  BP (!) 151/89   Pulse 95   Temp 98.9 F (37.2 C) (Oral)   Resp 18   Ht '5\' 1"'$  (1.549 m)   Wt 86.2 kg   SpO2 100%   BMI 35.90 kg/m  Gen:   Awake, no distress   Resp:  Normal effort  MSK:   Moves extremities without difficulty  Other:  No obvious deformity  Medical Decision Making  Medically screening exam initiated at 7:01 PM.  Appropriate orders placed.  Tracey Beard was informed that the remainder of the evaluation will be completed by another provider, this initial triage assessment does not replace that evaluation, and the importance of remaining in the ED until their evaluation is complete.     Tracey Beard 05/14/22 1902

## 2022-05-14 NOTE — Progress Notes (Signed)
Orthopedic Tech Progress Note Patient Details:  Tracey Beard [birth year] [id number]  Ortho Devices Type of Ortho Device: CAM walker Ortho Device/Splint Location: rle Ortho Device/Splint Interventions: Ordered, Application, Adjustment   Post Interventions Patient Tolerated: Well Instructions Provided: Care of device, Adjustment of device  Karolee Stamps 05/14/2022, 11:27 PM

## 2022-05-14 NOTE — ED Notes (Signed)
Turned down lights in pts room to decrease light stimuli

## 2022-05-14 NOTE — ED Notes (Signed)
Provider at pt bedside  

## 2022-05-15 ENCOUNTER — Encounter (HOSPITAL_COMMUNITY): Payer: Self-pay

## 2022-05-15 ENCOUNTER — Emergency Department (HOSPITAL_COMMUNITY): Payer: Self-pay

## 2022-05-15 ENCOUNTER — Other Ambulatory Visit: Payer: Self-pay

## 2022-05-15 ENCOUNTER — Inpatient Hospital Stay (HOSPITAL_COMMUNITY): Payer: Self-pay

## 2022-05-15 ENCOUNTER — Observation Stay (HOSPITAL_COMMUNITY): Payer: Self-pay

## 2022-05-15 DIAGNOSIS — S82201A Unspecified fracture of shaft of right tibia, initial encounter for closed fracture: Secondary | ICD-10-CM | POA: Diagnosis present

## 2022-05-15 DIAGNOSIS — I10 Essential (primary) hypertension: Secondary | ICD-10-CM | POA: Diagnosis present

## 2022-05-15 DIAGNOSIS — R413 Other amnesia: Secondary | ICD-10-CM

## 2022-05-15 LAB — BASIC METABOLIC PANEL
Anion gap: 15 (ref 5–15)
BUN: 15 mg/dL (ref 6–20)
CO2: 24 mmol/L (ref 22–32)
Calcium: 9.7 mg/dL (ref 8.9–10.3)
Chloride: 104 mmol/L (ref 98–111)
Creatinine, Ser: 0.89 mg/dL (ref 0.44–1.00)
GFR, Estimated: >60 mL/min (ref >60)
Glucose, Bld: 111 mg/dL — ABNORMAL HIGH (ref 70–99)
Potassium: 3.2 mmol/L — ABNORMAL LOW (ref 3.5–5.1)
Sodium: 143 mmol/L (ref 135–145)

## 2022-05-15 LAB — CBC WITH DIFFERENTIAL/PLATELET
Abs Immature Granulocytes: 0.12 10*3/uL — ABNORMAL HIGH (ref 0.00–0.07)
Basophils Absolute: 0.1 10*3/uL (ref 0.0–0.1)
Basophils Relative: 1 %
Eosinophils Absolute: 0.1 10*3/uL (ref 0.0–0.5)
Eosinophils Relative: 1 %
HCT: 39.5 % (ref 36.0–46.0)
Hemoglobin: 13.6 g/dL (ref 12.0–15.0)
Immature Granulocytes: 1 %
Lymphocytes Relative: 34 %
Lymphs Abs: 3.9 10*3/uL (ref 0.7–4.0)
MCH: 30.9 pg (ref 26.0–34.0)
MCHC: 34.4 g/dL (ref 30.0–36.0)
MCV: 89.8 fL (ref 80.0–100.0)
Monocytes Absolute: 0.9 10*3/uL (ref 0.1–1.0)
Monocytes Relative: 8 %
Neutro Abs: 6.3 10*3/uL (ref 1.7–7.7)
Neutrophils Relative %: 55 %
Platelets: 297 10*3/uL (ref 150–400)
RBC: 4.4 MIL/uL (ref 3.87–5.11)
RDW: 13.5 % (ref 11.5–15.5)
WBC: 11.3 10*3/uL — ABNORMAL HIGH (ref 4.0–10.5)
nRBC: 0 % (ref 0.0–0.2)

## 2022-05-15 LAB — URINALYSIS, ROUTINE W REFLEX MICROSCOPIC
Bilirubin Urine: NEGATIVE
Glucose, UA: NEGATIVE mg/dL
Hgb urine dipstick: NEGATIVE
Ketones, ur: NEGATIVE mg/dL
Leukocytes,Ua: NEGATIVE
Nitrite: NEGATIVE
Protein, ur: NEGATIVE mg/dL
Specific Gravity, Urine: 1.025 (ref 1.005–1.030)
pH: 5 (ref 5.0–8.0)

## 2022-05-15 LAB — HEPATIC FUNCTION PANEL
ALT: 20 U/L (ref 0–44)
AST: 19 U/L (ref 15–41)
Albumin: 4.1 g/dL (ref 3.5–5.0)
Alkaline Phosphatase: 68 U/L (ref 38–126)
Bilirubin, Direct: 0.1 mg/dL (ref 0.0–0.2)
Indirect Bilirubin: 0.9 mg/dL (ref 0.3–0.9)
Total Bilirubin: 1 mg/dL (ref 0.3–1.2)
Total Protein: 7.8 g/dL (ref 6.5–8.1)

## 2022-05-15 LAB — I-STAT BETA HCG BLOOD, ED (MC, WL, AP ONLY): I-stat hCG, quantitative: <5 m[IU]/mL (ref <5)

## 2022-05-15 LAB — GLUCOSE, CAPILLARY
Glucose-Capillary: 134 mg/dL — ABNORMAL HIGH (ref 70–99)
Glucose-Capillary: 125 mg/dL — ABNORMAL HIGH (ref 70–99)

## 2022-05-15 LAB — RAPID URINE DRUG SCREEN, HOSP PERFORMED
Amphetamines: NOT DETECTED
Barbiturates: NOT DETECTED
Benzodiazepines: NOT DETECTED
Cocaine: NOT DETECTED
Opiates: POSITIVE — AB
Tetrahydrocannabinol: NOT DETECTED

## 2022-05-15 LAB — CBG MONITORING, ED: Glucose-Capillary: 139 mg/dL — ABNORMAL HIGH (ref 70–99)

## 2022-05-15 LAB — HEMOGLOBIN A1C
Hgb A1c MFr Bld: 5.9 % — ABNORMAL HIGH (ref 4.8–5.6)
Mean Plasma Glucose: 122.63 mg/dL

## 2022-05-15 LAB — AMMONIA: Ammonia: 13 umol/L (ref 9–35)

## 2022-05-15 LAB — MAGNESIUM: Magnesium: 2.2 mg/dL (ref 1.7–2.4)

## 2022-05-15 LAB — ETHANOL: Alcohol, Ethyl (B): <10 mg/dL (ref <10)

## 2022-05-15 LAB — HIV ANTIBODY (ROUTINE TESTING W REFLEX): HIV Screen 4th Generation wRfx: NONREACTIVE

## 2022-05-15 LAB — VITAMIN B12: Vitamin B-12: 303 pg/mL (ref 180–914)

## 2022-05-15 LAB — PHOSPHORUS: Phosphorus: 3.6 mg/dL (ref 2.5–4.6)

## 2022-05-15 LAB — TSH: TSH: 1.432 u[IU]/mL (ref 0.350–4.500)

## 2022-05-15 MED ORDER — KETOROLAC TROMETHAMINE 30 MG/ML IJ SOLN
30.0000 mg | Freq: Four times a day (QID) | INTRAMUSCULAR | Status: AC | PRN
  Administered 2022-05-15 – 2022-05-16 (×3): 30 mg via INTRAVENOUS
  Filled 2022-05-15 (×3): qty 1

## 2022-05-15 MED ORDER — IOHEXOL 350 MG/ML SOLN
75.0000 mL | Freq: Once | INTRAVENOUS | Status: AC | PRN
  Administered 2022-05-15: 75 mL via INTRAVENOUS

## 2022-05-15 MED ORDER — HYDROMORPHONE HCL 2 MG/ML IJ SOLN
0.5000 mg | INTRAMUSCULAR | Status: AC
  Administered 2022-05-15: 0.5 mg via INTRAVENOUS
  Filled 2022-05-15: qty 1

## 2022-05-15 MED ORDER — OXYCODONE-ACETAMINOPHEN 5-325 MG PO TABS
1.0000 | ORAL_TABLET | ORAL | Status: DC | PRN
  Administered 2022-05-15 – 2022-05-16 (×6): 1 via ORAL
  Filled 2022-05-15 (×6): qty 1

## 2022-05-15 MED ORDER — PROCHLORPERAZINE EDISYLATE 10 MG/2ML IJ SOLN
10.0000 mg | Freq: Four times a day (QID) | INTRAMUSCULAR | Status: DC | PRN
  Administered 2022-05-15: 10 mg via INTRAVENOUS
  Filled 2022-05-15: qty 2

## 2022-05-15 MED ORDER — SODIUM CHLORIDE 0.9 % IV BOLUS
500.0000 mL | Freq: Once | INTRAVENOUS | Status: AC
  Administered 2022-05-15: 500 mL via INTRAVENOUS

## 2022-05-15 MED ORDER — HYDROXYZINE HCL 25 MG PO TABS
25.0000 mg | ORAL_TABLET | Freq: Once | ORAL | Status: AC
Start: 2022-05-15 — End: 2022-05-16
  Administered 2022-05-16: 25 mg via ORAL
  Filled 2022-05-15 (×2): qty 1

## 2022-05-15 MED ORDER — TRIAMTERENE-HCTZ 75-50 MG PO TABS
1.0000 | ORAL_TABLET | Freq: Every day | ORAL | Status: DC
  Administered 2022-05-15 – 2022-05-16 (×2): 1 via ORAL
  Filled 2022-05-15 (×2): qty 1

## 2022-05-15 MED ORDER — KETOROLAC TROMETHAMINE 30 MG/ML IJ SOLN
30.0000 mg | Freq: Three times a day (TID) | INTRAMUSCULAR | Status: DC | PRN
  Filled 2022-05-15: qty 1

## 2022-05-15 MED ORDER — MELATONIN 5 MG PO TABS
5.0000 mg | ORAL_TABLET | Freq: Every evening | ORAL | Status: DC | PRN
  Administered 2022-05-15: 5 mg via ORAL
  Filled 2022-05-15: qty 1

## 2022-05-15 MED ORDER — PROCHLORPERAZINE MALEATE 10 MG PO TABS
10.0000 mg | ORAL_TABLET | Freq: Once | ORAL | Status: AC
  Administered 2022-05-15: 10 mg via ORAL
  Filled 2022-05-15: qty 1

## 2022-05-15 MED ORDER — OXYCODONE HCL 5 MG PO TABS
5.0000 mg | ORAL_TABLET | Freq: Once | ORAL | Status: AC
  Administered 2022-05-15: 5 mg via ORAL
  Filled 2022-05-15: qty 1

## 2022-05-15 MED ORDER — POLYETHYLENE GLYCOL 3350 17 G PO PACK: 17.0000 g | PACK | Freq: Every day | ORAL | Status: DC | PRN

## 2022-05-15 MED ORDER — HYDROMORPHONE HCL 2 MG/ML IJ SOLN
1.0000 mg | Freq: Once | INTRAMUSCULAR | Status: AC
  Administered 2022-05-15: 1 mg via INTRAVENOUS
  Filled 2022-05-15: qty 1

## 2022-05-15 MED ORDER — METOCLOPRAMIDE HCL 5 MG/ML IJ SOLN
10.0000 mg | Freq: Once | INTRAMUSCULAR | Status: AC
  Administered 2022-05-15: 10 mg via INTRAVENOUS
  Filled 2022-05-15: qty 2

## 2022-05-15 MED ORDER — HALOPERIDOL LACTATE 5 MG/ML IJ SOLN
5.0000 mg | Freq: Once | INTRAMUSCULAR | Status: AC
  Administered 2022-05-15: 5 mg via INTRAVENOUS
  Filled 2022-05-15: qty 1

## 2022-05-15 MED ORDER — POTASSIUM CHLORIDE 2 MEQ/ML IV SOLN
INTRAVENOUS | Status: AC
  Filled 2022-05-15 (×2): qty 1000

## 2022-05-15 MED ORDER — OXYCODONE-ACETAMINOPHEN 5-325 MG PO TABS
1.0000 | ORAL_TABLET | ORAL | Status: DC | PRN
  Administered 2022-05-15: 1 via ORAL
  Filled 2022-05-15 (×2): qty 1

## 2022-05-15 MED ORDER — ORAL CARE MOUTH RINSE: 15.0000 mL | OROMUCOSAL | Status: DC | PRN

## 2022-05-15 MED ORDER — PREGABALIN 75 MG PO CAPS
75.0000 mg | ORAL_CAPSULE | Freq: Two times a day (BID) | ORAL | Status: DC
  Administered 2022-05-15 – 2022-05-16 (×3): 75 mg via ORAL
  Filled 2022-05-15 (×3): qty 1

## 2022-05-15 MED ORDER — ENOXAPARIN SODIUM 40 MG/0.4ML IJ SOSY: 40.0000 mg | PREFILLED_SYRINGE | INTRAMUSCULAR | Status: DC

## 2022-05-15 MED ORDER — ONDANSETRON HCL 4 MG/2ML IJ SOLN: 4.0000 mg | Freq: Four times a day (QID) | INTRAMUSCULAR | Status: DC | PRN

## 2022-05-15 MED ORDER — KETOROLAC TROMETHAMINE 30 MG/ML IJ SOLN
30.0000 mg | Freq: Once | INTRAMUSCULAR | Status: AC
  Administered 2022-05-15: 30 mg via INTRAVENOUS
  Filled 2022-05-15: qty 1

## 2022-05-15 MED ORDER — DEXAMETHASONE 4 MG PO TABS
4.0000 mg | ORAL_TABLET | Freq: Three times a day (TID) | ORAL | Status: DC
Start: 2022-05-15 — End: 2022-05-15

## 2022-05-15 MED ORDER — OXYCODONE HCL 5 MG PO TABS
5.0000 mg | ORAL_TABLET | ORAL | Status: DC | PRN
  Administered 2022-05-15 – 2022-05-16 (×6): 5 mg via ORAL
  Filled 2022-05-15 (×6): qty 1

## 2022-05-15 MED ORDER — KETOROLAC TROMETHAMINE 15 MG/ML IJ SOLN
15.0000 mg | Freq: Once | INTRAMUSCULAR | Status: AC
  Administered 2022-05-15: 15 mg via INTRAVENOUS
  Filled 2022-05-15: qty 1

## 2022-05-15 MED ORDER — ACETAMINOPHEN 325 MG PO TABS: 650.0000 mg | ORAL_TABLET | Freq: Four times a day (QID) | ORAL | Status: DC | PRN

## 2022-05-15 MED ORDER — INSULIN ASPART 100 UNIT/ML IJ SOLN
0.0000 [IU] | Freq: Three times a day (TID) | INTRAMUSCULAR | Status: DC
  Administered 2022-05-15: 3 [IU] via SUBCUTANEOUS
  Administered 2022-05-16: 2 [IU] via SUBCUTANEOUS
  Filled 2022-05-15: qty 0.15

## 2022-05-15 MED ORDER — PANTOPRAZOLE SODIUM 40 MG PO TBEC
40.0000 mg | DELAYED_RELEASE_TABLET | Freq: Two times a day (BID) | ORAL | Status: DC
  Administered 2022-05-15 – 2022-05-16 (×3): 40 mg via ORAL
  Filled 2022-05-15 (×3): qty 1

## 2022-05-15 MED ORDER — METOCLOPRAMIDE HCL 5 MG/ML IJ SOLN
10.0000 mg | Freq: Four times a day (QID) | INTRAMUSCULAR | Status: DC
  Administered 2022-05-15 – 2022-05-16 (×5): 10 mg via INTRAVENOUS
  Filled 2022-05-15 (×5): qty 2

## 2022-05-15 MED ORDER — ONDANSETRON HCL 4 MG PO TABS: 4.0000 mg | ORAL_TABLET | Freq: Four times a day (QID) | ORAL | Status: DC | PRN

## 2022-05-15 MED ORDER — POTASSIUM CHLORIDE CRYS ER 20 MEQ PO TBCR
40.0000 meq | EXTENDED_RELEASE_TABLET | Freq: Once | ORAL | Status: AC
  Administered 2022-05-15: 40 meq via ORAL
  Filled 2022-05-15: qty 2

## 2022-05-15 NOTE — ED Notes (Signed)
Patient given [place] sandwich. Attempted to administer toradol for pain, however PRN order not active until 1700. Will contact Dr. Olevia Bowens.

## 2022-05-15 NOTE — ED Notes (Signed)
Psychiatry at bedside.

## 2022-05-15 NOTE — ED Notes (Signed)
PT ambulated self to bathroom.

## 2022-05-15 NOTE — Progress Notes (Addendum)
Tracey Beard arrived to perform EEG and MRI Tech's were in room loading patient to take to MRI.  EEG Tech Tracey Beard noticed Pt had a sewed in Wig and verbally asked Pt with Nurse present as well as MRI Technologist if she was willing to Cut or removed Weave to perform EEG..the patient stated NO...sorry, Not doing that. She was getting out of bed to get into wheel chair to head to MRI.  NOT able to perform EEG due to Pt not available and NOT willing to remove Weave. EEG Tech asked Nurse to inform Dr.Ortiz and have the EEG Order canceled out of Epic.

## 2022-05-15 NOTE — ED Notes (Signed)
Patient provided with meal tray. Continues to complain of worsening pain to head, back, and ankle.

## 2022-05-15 NOTE — Consult Note (Signed)
Neurology Consultation Reason for Consult: Confusion after fall Referring Physician: Olevia Bowens, D  CC: Confusion after fall  History is obtained from: Patient, chart  HPI: Tracey Beard is a 41 y.o. female with history of IIH who presents with a fall.  She was diagnosed with IIH in 2014 with an opening pressure of 30.  She was on Lasix briefly, but subsequently was lost to follow-up with there is note in 2015 of sharp optic disks.  She states that she did not have any headaches prior to the fall, though she does have occasional headaches in the past.  She hit her head when she fell.  She does not remember the fall itself, but does state that someone told her that she had the right side of her head.  She now has headache across the front.  She has mild blurred vision.  She was confused after the head injury, and has had some dizziness.  She states that all the symptoms started after her fall, not prior to it.    Past Medical History:  Diagnosis Date   Anxiety    Chronic back pain    Depression    Gestational diabetes    glyburide   Headache    Hypertension    Idiopathic intracranial hypertension    Leukocytosis 02/25/2015   Obesity    Pregnancy induced hypertension    labetalol     Family History  Problem Relation Age of Onset   Hypertension Mother    Breast cancer Mother    Pancreatic cancer Mother    Diabetes Mother    Hypertension Father    Hypertension Maternal Grandmother    Heart disease Maternal Grandmother    Hypertension Maternal Grandfather    Diabetes Maternal Grandfather    Colon cancer Paternal Grandmother    Heart disease Paternal Grandfather    Hypertension Maternal Aunt    Breast cancer Maternal Aunt    Diabetes Maternal Aunt    Hypertension Maternal Uncle    Liver cancer Maternal Uncle    Prostate cancer Maternal Uncle    Miscarriages / [place] Daughter      Social History:  reports that she has never smoked. She has never used smokeless  tobacco. She reports current alcohol use. She reports that she does not use drugs.   Exam: Current vital signs: BP (!) 153/104 (BP Location: Left Arm)   Pulse 86   Temp 98 F (36.7 C) (Oral)   Resp 18   Ht '5\' 1"'$  (1.549 m)   Wt 86.2 kg   LMP 04/27/2022 (Approximate)   SpO2 100%   BMI 35.90 kg/m  Vital signs in last 24 hours: Temp:  [97.4 F (36.3 C)-98.2 F (36.8 C)] 98 F (36.7 C) (08/28 1551) Pulse Rate:  [75-113] 86 (08/28 1551) Resp:  [11-20] 18 (08/28 1551) BP: (122-157)/(71-104) 153/104 (08/28 1551) SpO2:  [98 %-100 %] 100 % (08/28 1551)   Physical Exam  Constitutional: Appears well-developed and well-nourished.  Psych: Affect appropriate to situation Eyes: No scleral injection HENT: No OP obstruction MSK: no joint deformities.  Cardiovascular: Normal rate and regular rhythm.  Respiratory: Effort normal, non-labored breathing GI: Soft.  No distension. There is no tenderness.  Skin: WDI  Neuro: Mental Status: Patient is awake, alert, oriented to person, place, month, year, and situation. Patient is able to give a clear and coherent history. No signs of aphasia or neglect She is able to spell world backwards and perform serial sevens 5/5 Cranial Nerves: II: Visual  Fields are full. Pupils are equal, round, and reactive to light.  I had difficulty visualizing the fundus due to patient comfort and compliance. III,IV, VI: EOMI without ptosis or diploplia.  V: Facial sensation is symmetric to temperature VII: Facial movement is symmetric.  VIII: hearing is intact to voice X: Uvula elevates symmetrically XI: Shoulder shrug is symmetric. XII: tongue is midline without atrophy or fasciculations.  Motor: Tone is normal. Bulk is normal. 5/5 strength was present in all four extremities.  Sensory: Sensation is symmetric to light touch and temperature in the arms and legs. Deep Tendon Reflexes: 2+ and symmetric in the biceps and patellae.  Cerebellar: FNF and HKS are  intact bilaterally  I have reviewed labs in epic and the results pertinent to this consultation are: Ammonia 13    I have reviewed the images obtained: CTA-negative including venous sinuses  Impression: 41 year old female with headache and confusion after mild head injury.  I think that in this setting, the most likely culprit is concussion, and with her improvement that is very reassuring.  Even in the setting of concussion, forgetting one's own name and autobiographical amnesia is extremely uncommon, and there is a possibility of some psychogenic overlay.  I doubt IIH is playing any role today.  With her improvement, though I think MRI is reasonable if this is negative, I would not pursue further work-up at this time.  For her possible IIH, I would have her follow-up serially as an outpatient with ophthalmology, with referral back to neurology if papilledema is detected.  Recommendations: 1) MRI brain  2) if negative, no further work-up at this time.   Roland Rack, MD [facility] [phone]  If 7pm- 7am, please page neurology on call as listed in [place].

## 2022-05-15 NOTE — H&P (Signed)
History and Physical    Patient: Tracey Beard UVO:[id number] DOB: Apr 22, 1981 DOA: 05/14/2022 DOS: the patient was seen and examined on 05/15/2022 PCP: Dian Queen, MD  Patient coming from: Home  Chief Complaint: No chief complaint on file.  HPI: Tracey Beard is a 41 y.o. female with medical history significant of anxiety, chronic back pain, depression, gestational diabetes, headaches, hypertension, idiopathic intracranial hypertension, leukocytosis, class II obesity, pregnancy induced hypertension who presented to the emergency department via home last night due to right ankle pain and swelling after presumably falling at home.  The patient was also endorsing a headache that got progressively worse and confusion with amnesia.  She could not provide further information.  She still complains of a whole head, but with more intensity on her right frontal area.  She denied dyspnea, chest, back or abdominal pain at the moment.  ED course: Initial vital signs were temperature 98.9 F, pulse 95, respirations 18, BP 151/89 mmHg O2 sat 100% on room air.  The patient received 500 mL of normal saline bolus, prochlorperazine 10 mg IVP, a tablet of Percocet 5/325 mg, metoclopramide 10 mg IVP, haloperidol 5 mg IVP, hydromorphone 0.5 mg IVP and ketorolac 50 mg IVP.  I added ketorolac 30 mg IVP x1, metoclopramide 10 mg IVP second dose and hydromorphone 1 mg IVP.  Lab work: CBC is her white count 11.3, hemoglobin 13.6 g/dL platelets 297.  A urinalysis was cloudy but otherwise normal.  UDS positive for opiates, but she did receive opiates in the ED.  Negative ammonia, alcohol and i-STAT hCG.  Normal LFTs, TSH, B12, magnesium and phosphorus.  Her BMP was only remarkable for a potassium of 3.2 mmol/L and a nonfasting glucose of 111 mg/dL.  Imaging: CT cervical spine with no acute fracture.  CT head with no evidence of acute intracranial abnormality.  Right ankle showed a very small nondisplaced fracture  involving the distal tip of the anterior right tibia.  Right foot x-ray was negative.  Right hip x-ray was negative.  No abnormalities were seen on CTA head and neck.   Review of Systems: As mentioned in the history of present illness. All other systems reviewed and are negative. Past Medical History:  Diagnosis Date   Anxiety    Chronic back pain    Depression    Gestational diabetes    glyburide   Headache    Hypertension    Idiopathic intracranial hypertension    Leukocytosis 02/25/2015   Obesity    Pregnancy induced hypertension    labetalol   Past Surgical History:  Procedure Laterality Date   CESAREAN SECTION N/A 01/29/2019   Procedure: CESAREAN SECTION;  Surgeon: [name], DO;  Location: [facility] ORS;  Service: Obstetrics;  Laterality: N/A;   CHOLECYSTECTOMY     LUMBAR PUNCTURE  2014   5x   Social History:  reports that she has never smoked. She has never used smokeless tobacco. She reports current alcohol use. She reports that she does not use drugs.  Allergies  Allergen Reactions   Elavil [Amitriptyline Hcl] Shortness Of Breath   Restoril [Temazepam] Shortness Of Breath   Trazodone And Nefazodone Anaphylaxis   Diamox [Acetazolamide] Other (See Comments)    Reaction:  Cold chills/loss of taste    Tomato Swelling    (Lips)   Maxalt [Rizatriptan Benzoate] Other (See Comments)    Reaction:  Chest tightness    Pollen Extract     Other reaction(s): Other (See Comments)   Benzocaine Swelling,  Rash and Other (See Comments)    Reaction:  Localized swelling   Latex Rash   Novocain [Procaine] Swelling, Rash and Other (See Comments)    Reaction:  Localized swelling    Family History  Problem Relation Age of Onset   Hypertension Mother    Breast cancer Mother    Pancreatic cancer Mother    Diabetes Mother    Hypertension Father    Hypertension Maternal Grandmother    Heart disease Maternal Grandmother    Hypertension Maternal Grandfather    Diabetes Maternal  Grandfather    Colon cancer Paternal Grandmother    Heart disease Paternal Grandfather    Hypertension Maternal Aunt    Breast cancer Maternal Aunt    Diabetes Maternal Aunt    Hypertension Maternal Uncle    Liver cancer Maternal Uncle    Prostate cancer Maternal Uncle    Miscarriages / [place] Daughter     Prior to Admission medications   Medication Sig Start Date End Date Taking? Authorizing Provider  chlorhexidine (PERIDEX) 0.12 % solution SMARTSIG:By Mouth 05/10/22   [provider]  cyclobenzaprine (FLEXERIL) 10 MG tablet Take 10 mg by mouth 3 (three) times daily. 03/04/21   [provider]  dexamethasone (DECADRON) 4 MG tablet Take 4 mg by mouth 3 (three) times daily. 05/10/22   [provider]  esomeprazole (NEXIUM) 40 MG capsule Take 1 capsule (40 mg total) by mouth 2 (two) times daily before a meal. 03/24/21   Zehr, Laban Emperor, PA-C  HYDROcodone-acetaminophen (NORCO/VICODIN) 5-325 MG tablet Take 1 tablet by mouth every 6 (six) hours as needed. 05/10/22   [provider]  ibuprofen (ADVIL) 600 MG tablet Take 600 mg by mouth every 6 (six) hours as needed. 05/10/22   [provider]  ibuprofen (ADVIL) 800 MG tablet Take 1 tablet (800 mg total) by mouth every 6 (six) hours as needed for moderate pain. 02/02/19   Louretta Shorten, MD  metoCLOPramide (REGLAN) 5 MG tablet Take 1 tablet (5 mg total) by mouth 4 (four) times daily. 03/24/21   Zehr, Laban Emperor, PA-C  oxyCODONE-acetaminophen (PERCOCET/ROXICET) 5-325 MG tablet Take 1-2 tablets by mouth every 4 (four) hours as needed for moderate pain. 02/02/19   Louretta Shorten, MD  pregabalin (LYRICA) 75 MG capsule Take 75 mg by mouth 2 (two) times daily. 05/08/22   [provider]  tiZANidine (ZANAFLEX) 4 MG tablet Take 4 mg by mouth 3 (three) times daily as needed. 04/05/22   [provider]  triamterene-hydrochlorothiazide (MAXZIDE) 75-50 MG tablet Take 1 tablet by mouth daily. 01/18/21   [provider]    Physical Exam: Vitals:   05/15/22 0230 05/15/22 0315 05/15/22 0543 05/15/22 0700  BP: (!) 150/101   (!) 141/71  Pulse: (!) 113 82  75  Resp: '20 17  11  '$ Temp:   98 F (36.7 C)   TempSrc:      SpO2: 100% 100%  98%  Weight:      Height:       Physical Exam Vitals and nursing note reviewed.  Constitutional:      General: She is awake. She is not in acute distress.    Appearance: Normal appearance. She is obese. She is not ill-appearing.  HENT:     Head: Normocephalic.     Nose: Nose normal.     Mouth/Throat:     Mouth: Mucous membranes are moist.  Eyes:     General: No scleral icterus.    Pupils:  Pupils are equal, round, and reactive to light.  Neck:     Vascular: No JVD.  Cardiovascular:     Rate and Rhythm: Normal rate and regular rhythm.     Heart sounds: S1 normal and S2 normal.  Pulmonary:     Effort: Pulmonary effort is normal.     Breath sounds: No wheezing, rhonchi or rales.  Abdominal:     General: Bowel sounds are normal. There is no distension.     Palpations: Abdomen is soft.     Tenderness: There is no abdominal tenderness. There is no guarding.  Musculoskeletal:     Cervical back: Neck supple.     Right lower leg: No edema.     Left lower leg: No edema.  Skin:    General: Skin is warm and dry.  Neurological:     General: No focal deficit present.     Mental Status: She is alert. She is disoriented.  Psychiatric:        Mood and Affect: Mood normal.        Behavior: Behavior normal. Behavior is cooperative.   Data Reviewed:  Results are pending, will review when available.  Assessment and Plan: Principal Problem:   Amnesia Observation/telemetry. Neurochecks every 4 hours. Reorientation and supportive care as needed. Consult psychiatry as suggested by neurology. MRI and EEG if psychiatry think is useful. Continue analgesics for headache.  Active Problems:   Right tibial fracture Nondisplaced. Has been  immobilized. Continue analgesics as needed.    Pseudotumor cerebri Continue dexamethasone 4 mg 3 times a day.    History of clinical depression Consulting psychiatry.    Borderline diabetes mellitus Check hemoglobin A1c. Carbohydrate modified diet. CBG monitoring before meals and bedtime. Regular insulin sliding scale with meals.    Gastroparesis Continue metoclopramide 10 mg IVP every 6 hours.    Hypertensive disorder Not on antihypertensives at home. Blood pressure better with pain control. Begin lisinopril 5 mg p.o. daily. Follow-up BP, renal function electrolytes with PCP in 1 to 2 weeks     Advance Care Planning:   Code Status: Full Code   Consults: Behavioral health.  Family Communication:   Severity of Illness: The appropriate patient status for this patient is OBSERVATION. Observation status is judged to be reasonable and necessary in order to provide the required intensity of service to ensure the patient's safety. The patient's presenting symptoms, physical exam findings, and initial radiographic and laboratory data in the context of their medical condition is felt to place them at decreased risk for further clinical deterioration. Furthermore, it is anticipated that the patient will be medically stable for discharge from the hospital within 2 midnights of admission.   Author: Reubin Milan, MD 05/15/2022 7:59 AM  For on call review [email or web address].   This document was prepared using Dragon voice recognition software and may contain some unintended transcription errors.

## 2022-05-15 NOTE — ED Notes (Addendum)
Pt ambulated to the bathroom without assistance. 

## 2022-05-15 NOTE — Plan of Care (Signed)
Discussed with ED provider patient who presented with a fall and initially reported ankle pain.  Subsequently she began reporting headache and reported hitting her head when she fell.  No evidence on examination of head trauma.  Head CT personally reviewed patient did not tolerate this exam well and it was highly motion degraded and within those limits negative.  Subsequently she has become more confused and is not even oriented to self.  However her neurological examination otherwise is completely intact per ED provider including strength, sensation and cranial nerves, speaking normally and understanding commands but she is purely amnestic  EDP is inquiring about work-up.  History is very atypical for concussion given symptoms gradually progressing while in the ED, and frankly atypical for any primary neurological disorder  Recommend CTA head and neck to exclude dissection and psychiatry evaluation If patient is not returning to baseline and psychiatry is concerned could obtain MRI brain with and without and EEG if patient will tolerate these examinations   [place] [phone]

## 2022-05-15 NOTE — Consult Note (Cosign Needed Addendum)
[facility] Face-to-Face Psychiatry Consult   Reason for Consult: Suspect underlying psychiatric condition Referring Physician: Dr. [place] Crane Patient Identification: Tracey Beard MRN:  [id number] Principal Diagnosis: Amnesia Diagnosis:  Principal Problem:   Amnesia Active Problems:   Pseudotumor cerebri   Clinical depression   Borderline diabetes mellitus   Gastroparesis   Hypertensive disorder   Right tibial fracture   Total Time spent with patient: 1 hour  Subjective:   Tracey Beard is a 41 y.o. female patient admitted after a fall, and right ankle injury.   Patient is a 41 year old female who presented to the emergency department after a fall.  Upon her presentation to the emergency department, she was alert and oriented x 4, however she reportedly had a fall while in the emergency department and has been altered ever since.  Her initial workup has been within normal limits to include CT scan and labs.  Patient is able to tell me that she fell and hit her head, and did not quite feel right on her way to the emergency room by ambulance, she then reports falling again when she got to the emergency room.  Outside of the headaches patient endorses back pain, foot pain.  She describes her headache as a frontal headache, started on the right side and moved to the left.  She is unable to describe any additional details.  On initial evaluation patient endorses history of worsening headaches, increasing falls, insomnia dizziness, altered mental status, and confusion.  She endorses having a medical history of chronic back pain, headaches, hypertension, diabetes, anxiety.  Patient is unable to describe idiopathic intracranial hypertension.  While assessing orientation patient is able to identify herself, she is unable to provide her maiden name.  She tells me that she is 41 years old " I found out that I am married and I have a son."  Further information is collected, as patient is unable to accurately  recall her marriage and the birth of her child, in which she becomes very tearful stating" who does not remember that her husband and children?  I know it sounds really bad but that hurts me."  Also while answering orientation questions patient is able to identify we are currently located in a hospital " this is a [facility]."  While she is from this area she is not able to provide the name of the hospital, without looking on flyers in her room.  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  Some of the things in this room do appear to be familiar."   When assessing for psychiatric symptoms, she denies.  Although the patient is aware she is currently a poor historian, and unable to accurately identify and or answer most questions due to her current state.  She does continue to ruminate about long-term prognosis and outcome for her ongoing confusion and altered mental status.  While assessing for previous psychiatric symptoms and history, patient does endorse trauma physical and emotional.  She reports in 2018 or 2019 she lost her mother and delivered a stillborn 2 weeks apart.  She is able to describe the delivery, stating that she was alone no one was present when her daughter was delivered.  She states her daughter was born sleeping, and there was not many medical interventions made to resuscitate.  She denies any trauma, hypervigilance, nightmares, flashbacks, avoidance while giving birth  to her son 3 years ago, in which she reports was delivered by cesarean section.  Patient does express interest in having additional children.   HPI:   Medical history including chronic back pain, headaches, hypertension, diabetes, anxiety idiopathic intercranial hypertension presents  with complaints of possible fall.  HPI is limited as patient was altered during my examination, she states that she thinks she fell but is  unsure how she fell or when she fell, she notes that she has a headache feels dizzy and has nausea, she also notes that her right ankle and foot hurts.   Per triage notes patient had a fall, imaging was ordered for the right ankle and then a CT head was ordered later as she was endorsing worsening headaches and she notes that she had a head trauma.  At time of triage patient was alert and orient x4 and is able to answer all questions without difficulty  Past Psychiatric History:   Risk to Self:  Denies Risk to Others:   Denies Prior Inpatient Therapy:  Denies Prior Outpatient Therapy:   Denies  Past Medical History:  Past Medical History:  Diagnosis Date   Anxiety    Chronic back pain    Depression    Gestational diabetes    glyburide   Headache    Hypertension    Idiopathic intracranial hypertension    Leukocytosis 02/25/2015   Obesity    Pregnancy induced hypertension    labetalol    Past Surgical History:  Procedure Laterality Date   CESAREAN SECTION N/A 01/29/2019   Procedure: CESAREAN SECTION;  Surgeon: [name], DO;  Location: [facility] LD ORS;  Service: Obstetrics;  Laterality: N/A;   CHOLECYSTECTOMY     LUMBAR PUNCTURE  2014   5x   Family History:  Family History  Problem Relation Age of Onset   Hypertension Mother    Breast cancer Mother    Pancreatic cancer Mother    Diabetes Mother    Hypertension Father    Hypertension Maternal Grandmother    Heart disease Maternal Grandmother    Hypertension Maternal Grandfather    Diabetes Maternal Grandfather    Colon cancer Paternal Grandmother    Heart disease Paternal Grandfather    Hypertension Maternal Aunt    Breast cancer Maternal Aunt    Diabetes Maternal Aunt    Hypertension Maternal Uncle    Liver cancer Maternal Uncle    Prostate cancer Maternal Uncle    Miscarriages / Stillbirths Daughter    Family Psychiatric  History: Noncontributory.  Patient does report mother has latent diagnosis of headaches.  No  known psychiatric diagnoses. Social History:  Social History   Substance and Sexual Activity  Alcohol Use Yes   Comment: socially     Social History   Substance and Sexual Activity  Drug Use No    Social History   Socioeconomic History   Marital status: Legally Separated    Spouse name: Not on file   Number of children: 2   Years of education: Not on file   Highest education level: Associate degree: occupational, Hotel manager, or vocational program  Occupational History   Occupation: Nurse  Tobacco Use   Smoking status: Never   Smokeless tobacco: Never  Vaping Use   Vaping Use: Never used  Substance and Sexual Activity   Alcohol use: Yes    Comment: socially   Drug use: No   Sexual activity: Not Currently    Birth control/protection: None  Other Topics Concern  Not on file  Social History Narrative   Not on file   Social Determinants of Health   Financial Resource Strain: Low Risk  (01/21/2019)   Overall Financial Resource Strain (CARDIA)    Difficulty of Paying Living Expenses: Not hard at all  Food Insecurity: No Food Insecurity (01/21/2019)   Hunger Vital Sign    Worried About Running Out of Food in the Last Year: Never true    Ran Out of Food in the Last Year: Never true  Transportation Needs: No Transportation Needs (08/26/2020)   PRAPARE - Hydrologist (Medical): No    Lack of Transportation (Non-Medical): No  Physical Activity: Not on file  Stress: Stress Concern Present (01/21/2019)   [place]    Feeling of Stress : To some extent  Social Connections: Not on file   Additional Social History:    Allergies:   Allergies  Allergen Reactions   Elavil [Amitriptyline Hcl] Shortness Of Breath   Restoril [Temazepam] Shortness Of Breath   Trazodone And Nefazodone Anaphylaxis   Diamox [Acetazolamide] Other (See Comments)    Cold chills/loss of taste    Tomato  Swelling and Other (See Comments)    Causes lips to swell   Maxalt [Rizatriptan Benzoate] Other (See Comments)    Chest tightness and respiratory distress   Norco [Hydrocodone-Acetaminophen] Itching   Pollen Extract Other (See Comments)    Unknown reaction    Benzocaine Swelling, Rash and Other (See Comments)    Localized swelling   Latex Rash   Novocain [Procaine] Swelling, Rash and Other (See Comments)    Localized swelling    Labs:  Results for orders placed or performed during the hospital encounter of 05/14/22 (from the past 48 hour(s))  Basic metabolic panel     Status: Abnormal   Collection Time: 05/15/22  1:15 AM  Result Value Ref Range   Sodium 143 135 - 145 mmol/L   Potassium 3.2 (L) 3.5 - 5.1 mmol/L   Chloride 104 98 - 111 mmol/L   CO2 24 22 - 32 mmol/L   Glucose, Bld 111 (H) 70 - 99 mg/dL    Comment: Glucose reference range applies only to samples taken after fasting for at least 8 hours.   BUN 15 6 - 20 mg/dL   Creatinine, Ser 0.89 0.44 - 1.00 mg/dL   Calcium 9.7 8.9 - 10.3 mg/dL   GFR, Estimated >60 >60 mL/min    Comment: (NOTE) Calculated using the CKD-EPI Creatinine Equation (2021)    Anion gap 15 5 - 15    Comment: Performed at [facility], [place] [address], [place], [place] [address]  CBC with Differential     Status: Abnormal   Collection Time: 05/15/22  1:15 AM  Result Value Ref Range   WBC 11.3 (H) 4.0 - 10.5 K/uL   RBC 4.40 3.87 - 5.11 MIL/uL   Hemoglobin 13.6 12.0 - 15.0 g/dL   HCT 39.5 36.0 - 46.0 %   MCV 89.8 80.0 - 100.0 fL   MCH 30.9 26.0 - 34.0 pg   MCHC 34.4 30.0 - 36.0 g/dL   RDW 13.5 11.5 - 15.5 %   Platelets 297 150 - 400 K/uL   nRBC 0.0 0.0 - 0.2 %   Neutrophils Relative % 55 %   Neutro Abs 6.3 1.7 - 7.7 K/uL   Lymphocytes Relative 34 %   Lymphs Abs 3.9 0.7 - 4.0 K/uL  Monocytes Relative 8 %   Monocytes Absolute 0.9 0.1 - 1.0 K/uL   Eosinophils Relative 1 %   Eosinophils Absolute 0.1 0.0 - 0.5 K/uL   Basophils  Relative 1 %   Basophils Absolute 0.1 0.0 - 0.1 K/uL   Immature Granulocytes 1 %   Abs Immature Granulocytes 0.12 (H) 0.00 - 0.07 K/uL    Comment: Performed at [facility], [place] [address] [place], [place] [address]  Ethanol     Status: None   Collection Time: 05/15/22  1:15 AM  Result Value Ref Range   Alcohol, Ethyl (B) <10 <10 mg/dL    Comment: (NOTE) Lowest detectable limit for serum alcohol is 10 mg/dL.  For medical purposes only. Performed at [facility], [place] [address], [place], [place] [address]   Magnesium     Status: None   Collection Time: 05/15/22  1:15 AM  Result Value Ref Range   Magnesium 2.2 1.7 - 2.4 mg/dL    Comment: Performed at [facility], [place] [address] [place], [place] [address]  Phosphorus     Status: None   Collection Time: 05/15/22  1:15 AM  Result Value Ref Range   Phosphorus 3.6 2.5 - 4.6 mg/dL    Comment: Performed at [facility], [place] [address], [place], [place] [address]  Hepatic function panel     Status: None   Collection Time: 05/15/22  1:15 AM  Result Value Ref Range   Total Protein 7.8 6.5 - 8.1 g/dL   Albumin 4.1 3.5 - 5.0 g/dL   AST 19 15 - 41 U/L   ALT 20 0 - 44 U/L   Alkaline Phosphatase 68 38 - 126 U/L   Total Bilirubin 1.0 0.3 - 1.2 mg/dL   Bilirubin, Direct 0.1 0.0 - 0.2 mg/dL   Indirect Bilirubin 0.9 0.3 - 0.9 mg/dL    Comment: Performed at [facility], [place] [address], [place], [place] [address]  I-Stat beta hCG blood, ED     Status: None   Collection Time: 05/15/22  1:18 AM  Result Value Ref Range   I-stat hCG, quantitative <5.0 <5 mIU/mL   Comment 3            Comment:   GEST. AGE      CONC.  (mIU/mL)   <=1 WEEK        5 - 50     2 WEEKS       50 - 500     3 WEEKS       100 - 10,000     4 WEEKS     1,000 - 30,000        FEMALE AND NON-PREGNANT FEMALE:     LESS THAN 5 mIU/mL   Ammonia     Status: None   Collection Time:  05/15/22  1:33 AM  Result Value Ref Range   Ammonia 13 9 - 35 umol/L    Comment: Performed at [facility], [place] [address] [place], [place] [address]  Urinalysis, Routine w reflex microscopic     Status: Abnormal   Collection Time: 05/15/22  5:12 AM  Result Value Ref Range   Color, Urine YELLOW YELLOW   APPearance CLOUDY (A) CLEAR   Specific Gravity, Urine 1.025 1.005 - 1.030   pH 5.0 5.0 - 8.0   Glucose, UA NEGATIVE NEGATIVE mg/dL   Hgb urine dipstick NEGATIVE NEGATIVE   Bilirubin Urine NEGATIVE NEGATIVE  Ketones, ur NEGATIVE NEGATIVE mg/dL   Protein, ur NEGATIVE NEGATIVE mg/dL   Nitrite NEGATIVE NEGATIVE   Leukocytes,Ua NEGATIVE NEGATIVE    Comment: Performed at [place] [address] [place], [place] [address]  HIV Antibody (routine testing w rflx)     Status: None   Collection Time: 05/15/22  5:25 AM  Result Value Ref Range   HIV Screen 4th Generation wRfx Non Reactive Non Reactive    Comment: Performed at [facility], [place] [address], [place], [place] [address]  TSH     Status: None   Collection Time: 05/15/22  7:43 AM  Result Value Ref Range   TSH 1.432 0.350 - 4.500 uIU/mL    Comment: Performed by a 3rd Generation assay with a functional sensitivity of <=0.01 uIU/mL. Performed at [facility], [place] [address], [place], [place] [address]   Vitamin B12     Status: None   Collection Time: 05/15/22  7:43 AM  Result Value Ref Range   Vitamin B-12 303 180 - 914 pg/mL    Comment: (NOTE) This assay is not validated for testing neonatal or myeloproliferative syndrome specimens for Vitamin B12 levels. Performed at [facility], [place] [address] [place], [place] [address]   Urine rapid drug screen (hosp performed)     Status: Abnormal   Collection Time: 05/15/22  8:15 AM  Result Value Ref Range   Opiates POSITIVE (A) NONE DETECTED   Cocaine NONE DETECTED NONE DETECTED   Benzodiazepines  NONE DETECTED NONE DETECTED   Amphetamines NONE DETECTED NONE DETECTED   Tetrahydrocannabinol NONE DETECTED NONE DETECTED   Barbiturates NONE DETECTED NONE DETECTED    Comment: (NOTE) DRUG SCREEN FOR MEDICAL PURPOSES ONLY.  IF CONFIRMATION IS NEEDED FOR ANY PURPOSE, NOTIFY LAB WITHIN 5 DAYS.  LOWEST DETECTABLE LIMITS FOR URINE DRUG SCREEN Drug Class                     Cutoff (ng/mL) Amphetamine and metabolites    1000 Barbiturate and metabolites    200 Benzodiazepine                 607 Tricyclics and metabolites     300 Opiates and metabolites        300 Cocaine and metabolites        300 THC                            50 Performed at [facility], [place] [address], [place],  [address]   CBG monitoring, ED     Status: Abnormal   Collection Time: 05/15/22 11:47 AM  Result Value Ref Range   Glucose-Capillary 139 (H) 70 - 99 mg/dL    Comment: Glucose reference range applies only to samples taken after fasting for at least 8 hours.    Current Facility-Administered Medications  Medication Dose Route Frequency Provider Last Rate Last Admin   acetaminophen (TYLENOL) tablet 650 mg  650 mg Oral Q6H PRN Irene Pap N, DO       insulin aspart (novoLOG) injection 0-15 Units  0-15 Units Subcutaneous TID WC Reubin Milan, MD       ketorolac (TORADOL) 30 MG/ML injection 30 mg  30 mg Intravenous Q6H PRN Reubin Milan, MD   30 mg at 05/15/22 1420   lactated ringers 1,000 mL with potassium chloride 40 mEq infusion   Intravenous Continuous  Kayleen Memos, [place] 50 mL/hr at 05/15/22 0516 New Bag at 05/15/22 0516   melatonin tablet 5 mg  5 mg Oral QHS PRN Kayleen Memos, DO       metoCLOPramide (REGLAN) injection 10 mg  10 mg Intravenous Q6H Reubin Milan, MD   10 mg at 05/15/22 6270   oxyCODONE-acetaminophen (PERCOCET/ROXICET) 5-325 MG per tablet 1 tablet  1 tablet Oral Q4H PRN Reubin Milan, MD   1 tablet at 05/15/22 1226   pantoprazole (PROTONIX)  EC tablet 40 mg  40 mg Oral BID Reubin Milan, MD   40 mg at 05/15/22 1226   polyethylene glycol (MIRALAX / GLYCOLAX) packet 17 g  17 g Oral Daily PRN Irene Pap N, DO       pregabalin (LYRICA) capsule 75 mg  75 mg Oral BID Reubin Milan, MD   75 mg at 05/15/22 1226   prochlorperazine (COMPAZINE) injection 10 mg  10 mg Intravenous Q6H PRN Irene Pap N, DO   10 mg at 05/15/22 0805   triamterene-hydrochlorothiazide (MAXZIDE) 75-50 MG per tablet 1 tablet  1 tablet Oral Daily Reubin Milan, MD        Musculoskeletal: Strength & Muscle Tone: within normal limits Gait & Station: normal Patient leans: Front            Psychiatric Specialty Exam:  Presentation  General Appearance: Bizarre  Eye Contact:Good  Speech:Clear and Coherent; Normal Rate  Speech Volume:Decreased  Handedness:Right   Mood and Affect  Mood:Anxious  Affect:Constricted; Congruent; Tearful   Thought Process  Thought Processes:Coherent; Irrevelant; Linear  Descriptions of Associations:Loose  Orientation:Partial (Self only)  Thought Content:Logical; Scattered  History of Schizophrenia/Schizoaffective disorder:No data recorded Duration of Psychotic Symptoms:No data recorded Hallucinations:Hallucinations: None  Ideas of Reference:None  Suicidal Thoughts:Suicidal Thoughts: No  Homicidal Thoughts:Homicidal Thoughts: No   Sensorium  Memory:Immediate Poor; Recent Poor; Remote Poor  Judgment:Poor  Insight:Shallow   Executive Functions  Concentration:Poor  Attention Span:Fair  Recall:Poor  Fund of Knowledge:Poor  Language:Fair   Psychomotor Activity  Psychomotor Activity:Psychomotor Activity: Normal   Assets  Assets:Desire for Improvement; Financial Resources/Insurance; Vocational/Educational; Social Support; Resilience; Physical Health; Leisure Time   Sleep  Sleep:Sleep: Poor   Physical Exam: Physical Exam Vitals and nursing note reviewed.   Constitutional:      Appearance: Normal appearance. She is obese.  Neurological:     Mental Status: She is alert. She is disoriented.     Motor: Weakness present.     Coordination: Coordination abnormal.     Deep Tendon Reflexes: Reflexes are normal and symmetric.    ROS Blood pressure (!) 153/104, pulse 86, temperature 98 F (36.7 C), temperature source Oral, resp. rate 18, height '5\' 1"'$  (1.549 m), weight 86.2 kg, last menstrual period 04/27/2022, SpO2 100 %. Body mass index is 35.9 kg/m.  Patient was brought in [facility] via EMS, after a fall and right ankle injury.  Patient denies any acute psychiatric symptoms.  She does endorse a previous psychiatric history of depression and anxiety, that was recently stable as far as she knows.  She denies any difficulty with sleeping, despite recent chart notes from her obstetrician regarding insomnia.  On today's evaluation she appears to be more alert and coherent, however continues to lack lucidness, memory recall, and confusion.  Patient does present as tearful throughout, which is reasonable considering her circumstances. Patient is a poor historian and unable to identify her date of birth, correct age, medications, previous psychiatric  history, and or important phone numbers to obtain collateral information.  Patient denies any depression, anxiety, psychosis, paranoia.  I do not feel that this patient needs to be put under IVC, nor to have inpatient hospitalization as she can be stabilized in the community with appropriate cognitive behavioral therapy and psychiatric evaluation once altered mental status improves after about 4 weeks of stabilization.  Patient denies history of any illicit, recreational, synthetic substances that can contribute to her ongoing altered mental status.  Patient does have physical complaints of back pain, foot pain, headaches secondary to fall. Patient will be Follow-up overnight observation and reassessed in the  morning by psychiatry.  Differential diagnosis includes transient global amnesia, dissociative amnesia, MDD with psychosis, and or insomnia.  Treatment Plan Summary: Plan recommend neurology do complete evaluation to include visual eye examination.  Patient has a history of headaches/migraines, however new onset of falls, confusion, altered mental status, and worsening headache. -TLC referral for outpatient psychiatric services to include cognitive behavioral therapy it [facility]. -Discussed with admitting team, neurology evaluation recommendations  -Will maintain Q 15 minutes observation for safety.   -Patient will participate in psychotherapy:  Social and communication skill training, learning based strategies, cognitive behavioral, and family object relations individuation separation intervention psychotherapies upon discharge.  -Will continue to monitor patient's mood and behavior.  -At present there are no known barriers to prevent psychiatric discharge.   Disposition: No evidence of imminent risk to self or others at present.   Patient does not meet criteria for psychiatric inpatient admission. Supportive therapy provided about ongoing stressors. Refer to IOP. Discussed crisis plan, support from social network, calling 911, coming to the Emergency Department, and calling Suicide Hotline.  Suella Broad, FNP 05/15/2022 4:18 PM

## 2022-05-15 NOTE — ED Notes (Signed)
Dr. Olevia Bowens contacted regarding patient concern with dose of percocet.

## 2022-05-15 NOTE — ED Notes (Signed)
Patient applied her CAM boot and called for assistance to go to the bathroom. Patient also states, "I feel confused." Patient then asked for a "stronger pain med" and stated, "that medicine I had on the previous shift was better and worked." Patient received Dilaudid on the previous shift.

## 2022-05-15 NOTE — ED Notes (Signed)
Patient given diet coke.

## 2022-05-15 NOTE — ED Provider Notes (Signed)
[facility] DEPT Provider Note   CSN: [id number] Arrival date & time: 05/14/22  1802     History  No chief complaint on file.   BRIDGITTE Beard is a 41 y.o. female.  HPI   Medical history including chronic back pain, headaches, hypertension, diabetes, anxiety idiopathic intercranial hypertension presents  with complaints of possible fall.  HPI is limited as patient was altered during my examination, she states that she thinks she fell but is unsure how she fell or when she fell, she notes that she has a headache feels dizzy and has nausea, she also notes that her right ankle and foot hurts.  Per triage notes patient had a fall, imaging was ordered for the right ankle and then a CT head was ordered later as she was endorsing worsening headaches and she notes that she had a head trauma.  At time of triage patient was alert and orient x4 and is able to answer all questions without difficulty.    Home Medications Prior to Admission medications   Medication Sig Start Date End Date Taking? Authorizing Provider  chlorhexidine (PERIDEX) 0.12 % solution SMARTSIG:By Mouth 05/10/22   [provider]  cyclobenzaprine (FLEXERIL) 10 MG tablet Take 10 mg by mouth 3 (three) times daily. 03/04/21   [provider]  dexamethasone (DECADRON) 4 MG tablet Take 4 mg by mouth 3 (three) times daily. 05/10/22   [provider]  esomeprazole (NEXIUM) 40 MG capsule Take 1 capsule (40 mg total) by mouth 2 (two) times daily before a meal. 03/24/21   Zehr, Laban Emperor, PA-C  HYDROcodone-acetaminophen (NORCO/VICODIN) 5-325 MG tablet Take 1 tablet by mouth every 6 (six) hours as needed. 05/10/22   [provider]  ibuprofen (ADVIL) 600 MG tablet Take 600 mg by mouth every 6 (six) hours as needed. 05/10/22   [provider]  ibuprofen (ADVIL) 800 MG tablet Take 1 tablet (800 mg total) by mouth every 6 (six) hours as needed for moderate pain. 02/02/19    Louretta Shorten, MD  metoCLOPramide (REGLAN) 5 MG tablet Take 1 tablet (5 mg total) by mouth 4 (four) times daily. 03/24/21   Zehr, Laban Emperor, PA-C  oxyCODONE-acetaminophen (PERCOCET/ROXICET) 5-325 MG tablet Take 1-2 tablets by mouth every 4 (four) hours as needed for moderate pain. 02/02/19   Louretta Shorten, MD  pregabalin (LYRICA) 75 MG capsule Take 75 mg by mouth 2 (two) times daily. 05/08/22   [provider]  tiZANidine (ZANAFLEX) 4 MG tablet Take 4 mg by mouth 3 (three) times daily as needed. 04/05/22   [provider]  triamterene-hydrochlorothiazide (MAXZIDE) 75-50 MG tablet Take 1 tablet by mouth daily. 01/18/21   [provider]      Allergies    Elavil [amitriptyline hcl], Restoril [temazepam], Trazodone and nefazodone, Diamox [acetazolamide], Tomato, Maxalt [rizatriptan benzoate], Pollen extract, Benzocaine, Latex, and Novocain [procaine]    Review of Systems   Review of Systems  Constitutional:  Negative for chills and fever.  Respiratory:  Negative for shortness of breath.   Cardiovascular:  Negative for chest pain.  Gastrointestinal:  Positive for nausea. Negative for abdominal pain.  Neurological:  Positive for dizziness and headaches.    Physical Exam Updated Vital Signs BP (!) 150/101   Pulse 82   Temp 98.2 F (36.8 C) (Oral)   Resp 17   Ht '5\' 1"'$  (1.549 m)   Wt 86.2 kg   LMP 04/27/2022 (Approximate)   SpO2 100%   BMI 35.90 kg/m  Physical Exam Vitals and nursing note reviewed.  Constitutional:      General: She is not in acute distress.    Appearance: She is not ill-appearing.  HENT:     Head: Normocephalic and atraumatic.     Comments: There is no deformity of the head present no raccoon eyes or battle sign noted.    Nose: No congestion.     Mouth/Throat:     Mouth: Mucous membranes are moist.     Pharynx: Oropharynx is clear.     Comments: No trismus no torticollis no oral trauma Eyes:     Extraocular Movements: Extraocular movements  intact.     Conjunctiva/sclera: Conjunctivae normal.     Pupils: Pupils are equal, round, and reactive to light.  Cardiovascular:     Rate and Rhythm: Normal rate and regular rhythm.     Pulses: Normal pulses.     Heart sounds: No murmur heard.    No friction rub. No gallop.  Pulmonary:     Effort: No respiratory distress.     Breath sounds: No wheezing, rhonchi or rales.     Comments: Ribs were nontender to palpation anterior and posterior. Abdominal:     Palpations: Abdomen is soft.     Tenderness: There is no abdominal tenderness. There is no right CVA tenderness or left CVA tenderness.  Musculoskeletal:     Comments: Spine palpated was nontender to palpation no step-off or deformities noted, no pelvis instability no leg shortening, she had focal tenderness along her right trochanter, without crepitus present, she also has noted tenderness on the proximal aspect of her first and second metatarsals.  She is neurovascularly intact in the upper and lower extremities 2+ pedal pulses 2+ radial pulses she is moving all 4 extremities without difficulty.  Skin:    General: Skin is warm and dry.     Comments: No evidence of trauma present patient's chest abdomen or back.  Neurological:     Mental Status: She is alert.     Comments: Cranial nerves II through XII grossly intact no difficulty with word finding following two-step commands no unilateral weakness.  Patient is altered, she is alert to herself, she cannot tell me the day, where she is, what had happened,  Psychiatric:        Mood and Affect: Mood normal.     ED Results / Procedures / Treatments   Labs (all labs ordered are listed, but only abnormal results are displayed) Labs Reviewed  BASIC METABOLIC PANEL - Abnormal; Notable for the following components:      Result Value   Potassium 3.2 (*)    Glucose, Bld 111 (*)    All other components within normal limits  CBC WITH DIFFERENTIAL/PLATELET - Abnormal; Notable for the  following components:   WBC 11.3 (*)    Abs Immature Granulocytes 0.12 (*)    All other components within normal limits  ETHANOL  AMMONIA  RAPID URINE DRUG SCREEN, HOSP PERFORMED  URINALYSIS, ROUTINE W REFLEX MICROSCOPIC  HIV ANTIBODY (ROUTINE TESTING W REFLEX)  I-STAT BETA HCG BLOOD, ED ([facility], [facility], [facility] ONLY)    EKG EKG Interpretation  Date/Time:  Monday May 15 2022 03:32:13 EDT Ventricular Rate:  86 PR Interval:  114 QRS Duration: 91 QT Interval:  390 QTC Calculation: 467 R Axis:   54 Text Interpretation: Sinus rhythm Borderline short PR interval Borderline T abnormalities, diffuse leads Rate is faster Confirmed by Shanon Rosser [phone]) on 05/15/2022 3:35:23 AM  Radiology DG  Hip Unilat W or Wo Pelvis 2-3 Views Right  Result Date: 05/15/2022 CLINICAL DATA:  Right hip pain following fall today, initial encounter EXAM: DG HIP (WITH OR WITHOUT PELVIS) 3V RIGHT COMPARISON:  None Available. FINDINGS: Pelvic ring is intact. No acute fracture or dislocation is noted. No soft tissue abnormality is seen. IMPRESSION: No acute abnormality noted. Electronically Signed   By: Inez Catalina M.D.   On: 05/15/2022 02:49   CT Cervical Spine Wo Contrast  Result Date: 05/15/2022 CLINICAL DATA:  Fall EXAM: CT CERVICAL SPINE WITHOUT CONTRAST TECHNIQUE: Multidetector CT imaging of the cervical spine was performed without intravenous contrast. Multiplanar CT image reconstructions were also generated. RADIATION DOSE REDUCTION: This exam was performed according to the departmental dose-optimization program which includes automated exposure control, adjustment of the mA and/or kV according to patient size and/or use of iterative reconstruction technique. COMPARISON:  None Available. FINDINGS: Alignment: No static subluxation. Facets are aligned. Occipital condyles and the lateral masses of C1 and C2 are normally approximated. Skull base and vertebrae: No acute fracture. Soft tissues and spinal canal: No prevertebral  fluid or swelling. No visible canal hematoma. Disc levels: No advanced spinal canal or neural foraminal stenosis. Upper chest: No pneumothorax, pulmonary nodule or pleural effusion. Other: Normal visualized paraspinal cervical soft tissues. IMPRESSION: No acute fracture or static subluxation of the cervical spine. Electronically Signed   By: Ulyses Jarred M.D.   On: 05/15/2022 00:49   DG Foot Complete Right  Result Date: 05/15/2022 CLINICAL DATA:  Twisted ankle EXAM: RIGHT FOOT COMPLETE - 3+ VIEW COMPARISON:  None Available. FINDINGS: There is no evidence of fracture or dislocation. There is no evidence of arthropathy or other focal bone abnormality. Soft tissues are unremarkable. IMPRESSION: Negative. Electronically Signed   By: Ulyses Jarred M.D.   On: 05/15/2022 00:47   CT Head Wo Contrast  Result Date: 05/14/2022 CLINICAL DATA:  Headache, dizziness EXAM: CT HEAD WITHOUT CONTRAST TECHNIQUE: Contiguous axial images were obtained from the base of the skull through the vertex without intravenous contrast. RADIATION DOSE REDUCTION: This exam was performed according to the departmental dose-optimization program which includes automated exposure control, adjustment of the mA and/or kV according to patient size and/or use of iterative reconstruction technique. COMPARISON:  01/14/2016 FINDINGS: Motion degraded images. Brain: No evidence of acute infarction, hemorrhage, hydrocephalus, extra-axial collection or mass lesion/mass effect. Vascular: No hyperdense vessel or unexpected calcification. Skull: Normal. Negative for fracture or focal lesion. Sinuses/Orbits: The visualized paranasal sinuses are essentially clear. The mastoid air cells are unopacified. Other: None. IMPRESSION: Motion degraded images. No evidence of acute intracranial abnormality. Electronically Signed   By: Julian Hy M.D.   On: 05/14/2022 22:44   DG Ankle Complete Right  Result Date: 05/14/2022 CLINICAL DATA:  Status post fall.  EXAM: RIGHT ANKLE - COMPLETE 3+ VIEW COMPARISON:  None Available. FINDINGS: A very small cortical defect is seen along the distal tip of the anterior right tibia. There is no evidence of dislocation. A mild amount of adjacent soft tissue swelling is noted. IMPRESSION: Very small nondisplaced fracture involving the distal tip of the anterior right tibia. Electronically Signed   By: Virgina Norfolk M.D.   On: 05/14/2022 19:21    Procedures Procedures    Medications Ordered in ED Medications  iohexol (OMNIPAQUE) 350 MG/ML injection 75 mL (has no administration in time range)  lactated ringers 1,000 mL with potassium chloride 40 mEq infusion (has no administration in time range)  acetaminophen (TYLENOL) tablet 650 mg (  has no administration in time range)  prochlorperazine (COMPAZINE) injection 10 mg (has no administration in time range)  melatonin tablet 5 mg (has no administration in time range)  polyethylene glycol (MIRALAX / GLYCOLAX) packet 17 g (has no administration in time range)  oxyCODONE-acetaminophen (PERCOCET/ROXICET) 5-325 MG per tablet 1 tablet (1 tablet Oral Given 05/14/22 2302)  ondansetron (ZOFRAN-ODT) disintegrating tablet 8 mg (8 mg Oral Given 05/14/22 2302)  metoCLOPramide (REGLAN) injection 10 mg (10 mg Intravenous Given 05/15/22 0207)  ketorolac (TORADOL) 15 MG/ML injection 15 mg (15 mg Intravenous Given 05/15/22 0206)  sodium chloride 0.9 % bolus 500 mL (0 mLs Intravenous Stopped 05/15/22 0307)  prochlorperazine (COMPAZINE) tablet 10 mg (10 mg Oral Given 05/15/22 0307)  haloperidol lactate (HALDOL) injection 5 mg (5 mg Intravenous Given 05/15/22 0340)    ED Course/ Medical Decision Making/ A&P                           Medical Decision Making Amount and/or Complexity of Data Reviewed Labs: ordered. Radiology: ordered.  Risk Prescription drug management. Decision regarding hospitalization.   This patient presents to the ED for concern of fall, this involves an  extensive number of treatment options, and is a complaint that carries with it a high risk of complications and morbidity.  The differential diagnosis includes intracranial head bleed, metabolic derailments, sepsis    Additional history obtained:  Additional history obtained from triage External records from outside source obtained and reviewed including previous PCP notes   Co morbidities that complicate the patient evaluation  N/A  Social Determinants of Health:  N/A    Lab Tests:  I Ordered, and personally interpreted labs.  The pertinent results include: CBC shows slight leukocytosis 11.3, BMP shows potassium 3.2, glucose 111, ethanol less than 10, ammonia 13, i-STAT hCG less than 5   Imaging Studies ordered:  I ordered imaging studies including CT head, CT C-spine, DG of right ankle, right foot, right hip I independently visualized and interpreted imaging which showed CT head negative, right ankle reveals small anterior fracture of the distal tibia I agree with the radiologist interpretation   Cardiac Monitoring:  The patient was maintained on a cardiac monitor.  I personally viewed and interpreted the cardiac monitored which showed an underlying rhythm of: EKG without signs of ischemia   Medicines ordered and prescription drug management:  I ordered medication including antiemetics, pain medication I have reviewed the patients home medicines and have made adjustments as needed  Critical Interventions:  N/A   Reevaluation:  Presents after a fall, on arrival she was alert and orient x4 but during my examination post 6 hours she is unable to tell me her name, what had happened, where she is, she is confused, there is no neurological deficits, I suspect likely significant concussion, but due significant decrease in mental status will obtain further lab work imaging and reassess.  Patient was reassessed she continues to be confused cannot tell me how she fell or  how she got here, she will intermittently remember then forget her name.  I note no focal deficits present my exam there is no head deformities present, lab work is unremarkable, will consult with neurology for further recommendations.  Updated patient on recommendation from neurology agreement this plan will admit to hospitalist team   Consultations Obtained:  I requested consultation with the neurologist Dr. Curly Shores ,  and discussed lab and imaging findings as well as pertinent plan -  they recommend: Presentation which was described to her it seems more consistent with psychiatric problem, atypical to go from alert and orient x4 to confused and 5 hours, especially without focal deficits or evidence of head deformity, she recommends obtaining CT angio head and neck, and can admit for observation, possibly obtain EEG or MRI if psychiatry feels there is an underlying neurological abnormality Spoke with Dr. Nevada Crane will admit the patient    Test Considered:  N/A    Rule out I have low suspicion for intracranial head bleed or CVA , CT imaging is negative for acute findings, there is no focal deficits present my exam.  She is exhibiting forgetfulness, her presentation is not consistent with acute head injury there no evidence of trauma during my exam and there is no focal deficits.  Low suspicion for meningitis as there is no meningeal sign.  I have low suspicion for metabolic derailments as lab work is unremarkable.  Low suspicion for thoracic/abdominal abnormality as she is no evident trauma on my exam both which are nontender to palpation.    Dispostion and problem list  After consideration of the diagnostic results and the patients response to treatment, I feel that the patent would benefit from admission.  Altered mental status-unclear etiology, possible this is due to concussion, psychiatric disorder, or substance use.  Patient will need further evaluation and  observation.             Final Clinical Impression(s) / ED Diagnoses Final diagnoses:  Injury of head, initial encounter  Altered mental status, unspecified altered mental status type    Rx / DC Orders ED Discharge Orders     None         Marcello Fennel, PA-C 05/15/22 0430    Molpus, Jenny Reichmann, MD 05/15/22 [phone]

## 2022-05-15 NOTE — ED Notes (Addendum)
Dr. Olevia Bowens aware of patient A&Ox2 during neuro check. Per Dr. Olevia Bowens, ok to hold insulin at this time as patient has questions regarding medication for diabetes management.

## 2022-05-15 NOTE — ED Notes (Signed)
Patient ambulatory to restroom  ?

## 2022-05-16 ENCOUNTER — Inpatient Hospital Stay (HOSPITAL_COMMUNITY): Payer: Self-pay

## 2022-05-16 LAB — CBC
HCT: 37.6 % (ref 36.0–46.0)
Hemoglobin: 12.6 g/dL (ref 12.0–15.0)
MCH: 30.7 pg (ref 26.0–34.0)
MCHC: 33.5 g/dL (ref 30.0–36.0)
MCV: 91.5 fL (ref 80.0–100.0)
Platelets: 293 10*3/uL (ref 150–400)
RBC: 4.11 MIL/uL (ref 3.87–5.11)
RDW: 13.7 % (ref 11.5–15.5)
WBC: 8.8 10*3/uL (ref 4.0–10.5)
nRBC: 0 % (ref 0.0–0.2)

## 2022-05-16 LAB — BASIC METABOLIC PANEL
Anion gap: 7 (ref 5–15)
BUN: 17 mg/dL (ref 6–20)
CO2: 23 mmol/L (ref 22–32)
Calcium: 9.2 mg/dL (ref 8.9–10.3)
Chloride: 108 mmol/L (ref 98–111)
Creatinine, Ser: 0.85 mg/dL (ref 0.44–1.00)
GFR, Estimated: >60 mL/min (ref >60)
Glucose, Bld: 130 mg/dL — ABNORMAL HIGH (ref 70–99)
Potassium: 3.5 mmol/L (ref 3.5–5.1)
Sodium: 138 mmol/L (ref 135–145)

## 2022-05-16 LAB — GLUCOSE, CAPILLARY
Glucose-Capillary: 128 mg/dL — ABNORMAL HIGH (ref 70–99)
Glucose-Capillary: 92 mg/dL (ref 70–99)

## 2022-05-16 MED ORDER — OXYCODONE-ACETAMINOPHEN 10-325 MG PO TABS: 1.0000 | ORAL_TABLET | Freq: Four times a day (QID) | ORAL | Status: DC | PRN

## 2022-05-16 NOTE — Progress Notes (Signed)
Orthopedic Tech Progress Note Patient Details:  [name] 16-Feb-1981 [id number]  Patient ID: Tracey Beard, female   DOB: 09/04/1981, 41 y.o.   MRN: [id number]  Tracey Beard 05/16/2022, 10:57 AM Crutches adjusted and given to patient

## 2022-05-16 NOTE — TOC Transition Note (Signed)
Transition of Care [facility]) - CM/SW Discharge Note   Patient Details  Name: DESMA WILKOWSKI MRN: [id number] Date of Birth: 07-Dec-1980  Transition of Care [facility]) CM/SW Contact:  Leeroy Cha, RN Phone Number: 05/16/2022, 11:21 AM   Clinical Narrative:    [id number] discharged to return home.  Chart reviewed for TOC needs.  None found.  Patient self care.     Barriers to Discharge: Barriers Resolved   Patient Goals and CMS Choice Patient states their goals for this hospitalization and ongoing recovery are:: to go home CMS Medicare.gov Compare Post Acute Care list provided to:: Patient    Discharge Placement                       Discharge Plan and Services   Discharge Planning Services: CM Consult                                 Social Determinants of Health (SDOH) Interventions     Readmission Risk Interventions   No data to display

## 2022-05-16 NOTE — Progress Notes (Signed)
Chaplain received a consult that Tracey Beard wanted to discuss HCPOA. Chaplain brought her the paperwork and reviewed it with her.

## 2022-05-16 NOTE — TOC Initial Note (Signed)
Transition of Care [facility]) - Initial/Assessment Note    Patient Details  Name: Tracey Beard MRN: [id number] Date of Birth: 14-Apr-1981  Transition of Care [facility]) CM/SW Contact:    Leeroy Cha, RN Phone Number: 05/16/2022, 7:32 AM  Clinical Narrative:                  Transition of Care [facility]) Screening Note   Patient Details  Name: Tracey Beard Date of Birth: April 07, 1981   Transition of Care [facility]) CM/SW Contact:    Leeroy Cha, RN Phone Number: 05/16/2022, 7:32 AM    Transition of Care Department [facility]) has reviewed patient and no TOC needs have been identified at this time. We will continue to monitor patient advancement through interdisciplinary progression rounds. If new patient transition needs arise, please place a TOC consult.    Expected Discharge Plan: Home/Self Care Barriers to Discharge: Continued Medical Work up   Patient Goals and CMS Choice Patient states their goals for this hospitalization and ongoing recovery are:: to go home CMS Medicare.gov Compare Post Acute Care list provided to:: Patient    Expected Discharge Plan and Services Expected Discharge Plan: Home/Self Care   Discharge Planning Services: CM Consult   Living arrangements for the past 2 months: Single Family Home                                      Prior Living Arrangements/Services Living arrangements for the past 2 months: Single Family Home Lives with:: Self (legally separated) Patient language and need for interpreter reviewed:: Yes Do you feel safe going back to the place where you live?: Yes            Criminal Activity/Legal Involvement Pertinent to Current Situation/Hospitalization: No - Comment as needed  Activities of Daily Living Home Assistive Devices/Equipment: None ADL Screening (condition at time of admission) Patient's cognitive ability adequate to safely complete daily activities?: No Is the patient deaf or have difficulty hearing?: No Does  the patient have difficulty seeing, even when wearing glasses/contacts?: No Does the patient have difficulty concentrating, remembering, or making decisions?: No Patient able to express need for assistance with ADLs?: Yes Does the patient have difficulty dressing or bathing?: No Independently performs ADLs?: Yes (appropriate for developmental age) Does the patient have difficulty walking or climbing stairs?: Yes Weakness of Legs: Right Weakness of Arms/Hands: None  Permission Sought/Granted                  Emotional Assessment Appearance:: Appears stated age Attitude/Demeanor/Rapport: Engaged Affect (typically observed): Calm Orientation: : Oriented to Self, Oriented to Place, Oriented to  Time, Oriented to Situation Alcohol / Substance Use: Never Used Psych Involvement: Yes (comment)  Admission diagnosis:  Amnesia [R41.3] Injury of head, initial encounter [S09.90XA] Altered mental status, unspecified altered mental status type [R41.82] Patient Active Problem List   Diagnosis Date Noted   Amnesia 05/15/2022   Hypertensive disorder 05/15/2022   Right tibial fracture 23/53/6144   Periumbilical abdominal pain 03/24/2021   Altered bowel habits 03/24/2021   Gestational hypertension 01/29/2019   S/P cesarean section 01/29/2019   Abnormal glucose tolerance test (GTT) during pregnancy, antepartum 12/06/2018   Adjustment disorder with depressed mood 09/05/2017   Gastroparesis 07/25/2017   Nausea and vomiting 06/18/2017   Generalized abdominal pain 06/18/2017   Diarrhea 06/18/2017   Sacroiliitis ([place]) 04/07/2017   Grief reaction 01/06/2017  IUFD at 43 weeks or more of gestation 11/01/2016   Borderline diabetes mellitus 08/25/2015   Papillary thyroid carcinoma ([place]) 08/14/2015   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/2015   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   Pseudotumor cerebri 07/11/2014   Obesity 07/11/2014   Chronic back pain 07/11/2014   Elevated blood-pressure reading without diagnosis  of hypertension 05/27/2014   Dizziness 03/05/2014   Anxiety state 12/05/2013   Back ache 12/05/2013   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

## 2022-05-16 NOTE — Evaluation (Signed)
Physical Therapy One Time Evaluation Patient Details Name: Tracey Beard MRN: [id number] DOB: 09-Dec-1980 Today's Date: 05/16/2022  History of Present Illness  41 y.o. female with medical history significant of anxiety, chronic back pain, depression, gestational diabetes, idiopathic intracranial hypertension, pregnancy-induced hypertension presented with confusion/amnesia, right ankle pain after falling at home. Right ankle x-ray showed small nondisplaced fracture of the distal tip of anterior tibia.  Right foot x-ray and right hip x-ray were negative.  No abnormalities were seen on CTA head and neck.  She was evaluated by psychiatry and neurology.  Psychiatry recommended outpatient follow-up.  MRI of brain was negative for acute abnormality.  Clinical Impression  Patient evaluated by Physical Therapy with no further acute PT needs identified. All education has been completed and the patient has no further questions.  Pt fit for and educated on gait training with crutches.  Pt anticipates d/c home soon and will have assist available during evenings/nights if needed. See below for any follow-up Physical Therapy or equipment needs. PT is signing off. Thank you for this referral.        Recommendations for follow up therapy are one component of a multi-disciplinary discharge planning process, led by the attending physician.  Recommendations may be updated based on patient status, additional functional criteria and insurance authorization.  Follow Up Recommendations No PT follow up (recommended f/u with Ortho or PCP in regards to nondisplaced distal tibial fx)      Assistance Recommended at Discharge PRN  Patient can return home with the following  Help with stairs or ramp for entrance    Equipment Recommendations Crutches (ortho tech called)  Recommendations for Other Services       Functional Status Assessment Patient has had a recent decline in their functional status and demonstrates the  ability to make significant improvements in function in a reasonable and predictable amount of time.     Precautions / Restrictions Precautions Precaution Comments: R CAM boot Restrictions Weight Bearing Restrictions: No Other Position/Activity Restrictions: pt reports no restrictions, no orders      Mobility  Bed Mobility Overal bed mobility: Modified Independent                  Transfers Overall transfer level: Modified independent                      Ambulation/Gait Ambulation/Gait assistance: Supervision Gait Distance (Feet): 120 Feet Assistive device: Crutches Gait Pattern/deviations: Step-through pattern Gait velocity: decreased     General Gait Details: pt educated on gait training with crutches, crutches fit to pt and explained WBing through hands not shoulders,  Stairs            Wheelchair Mobility    Modified Rankin (Stroke Patients Only)       Balance                                             Pertinent Vitals/Pain Pain Assessment Pain Assessment: Faces Faces Pain Scale: Hurts little more Pain Location: right ankle Pain Intervention(s): Repositioned, Monitored during session (RN reports no pain meds due yet, pt encouraged to ice and elevate)    Home Living Family/patient expects to be discharged to:: Private residence Living Arrangements: Spouse/significant other     Home Access: Level entry       Home Layout: One level Home Equipment: None  Prior Function Prior Level of Function : Independent/Modified Independent             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       Hand Dominance        Extremity/Trunk Assessment        Lower Extremity Assessment Lower Extremity Assessment: Overall WFL for tasks assessed;RLE deficits/detail RLE Deficits / Details: pt able to don/doff CAM    Cervical / Trunk Assessment Cervical / Trunk Assessment: Normal  Communication    Communication: No difficulties  Cognition Arousal/Alertness: Awake/alert Behavior During Therapy: WFL for tasks assessed/performed Overall Cognitive Status: Within Functional Limits for tasks assessed                                          General Comments      Exercises     Assessment/Plan    PT Assessment Patient does not need any further PT services  PT Problem List         PT Treatment Interventions      PT Goals (Current goals can be found in the Care Plan section)  Acute Rehab PT Goals PT Goal Formulation: All assessment and education complete, DC therapy    Frequency       Co-evaluation               AM-PAC PT "6 Clicks" Mobility  Outcome Measure Help needed turning from your back to your side while in a flat bed without using bedrails?: None Help needed moving from lying on your back to sitting on the side of a flat bed without using bedrails?: None Help needed moving to and from a bed to a chair (including a wheelchair)?: None Help needed standing up from a chair using your arms (e.g., wheelchair or bedside chair)?: None Help needed to walk in hospital room?: A Little Help needed climbing 3-5 steps with a railing? : A Little 6 Click Score: 22    End of Session   Activity Tolerance: Patient tolerated treatment well Patient left: in chair;with call bell/phone within reach;with nursing/sitter in room   PT Visit Diagnosis: Difficulty in walking, not elsewhere classified (R26.2)    Time: 3570-1779 PT Time Calculation (min) (ACUTE ONLY): 37 min   Charges:   PT Evaluation $PT Eval Low Complexity: 1 Low PT Treatments $Gait Training: 8-22 mins      Jannette Spanner PT, DPT Physical Therapist Acute Rehabilitation Services Preferred contact method: Secure Chat Weekend Pager Only: [phone] Office: [place] 05/16/2022, 11:21 AM

## 2022-05-16 NOTE — Consult Note (Signed)
[facility] Face-to-Face Psychiatry Consult   Reason for Consult: Suspect underlying psychiatric condition Referring Physician: Dr. Nevada Crane Patient Identification: Tracey Beard MRN:  [id number] Principal Diagnosis: Amnesia Diagnosis:  Principal Problem:   Amnesia Active Problems:   Pseudotumor cerebri   Clinical depression   Borderline diabetes mellitus   Gastroparesis   Hypertensive disorder   Right tibial fracture   Total Time spent with patient: 1 hour  Subjective:   Tracey Beard is a 41 y.o. female patient admitted after a fall, and right ankle injury.   Tracey Beard  presented as a pleasant, down to earth female  who cooperatively participated in the interview. [name] was alert and well oriented, with no overt signs of cognitive difficulty or impairment. Her speech was of normal rate, tone, volume and fluency. She is able to recall this psychiatric nurse practitioner from yesterday evaluation, she brightens upon approach and is eager to speak with this provider. We reviewed her recent lab findings, to include MRI results. She is able to verbalize that her neurologic symptoms are likely psychogenic in nature, and was open to discussing her stressors. " I am really stressed. I have a lot going on. 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 She reports some problems in her marriage, inability to properly grieve after the loss of her mother and child. " I had to hold in all that pain, and I never let it go." She further reports being the mother of an active rambunctious 35 year old.  Her mood and affect were euthymic, modulating appropriately in accord with what he discussed. [name]'s responses to questions were relevant and to the point, with thought processes linear and goal directed, with no signs of thought disorder, flight of ideas, suicidal or paranoid ideation. She is neurological intact and shows modest improvement in her psychiatric presentation when compared to  yesterday. She is open to therapy and we reviewed several therapist at [place] that focus on CBT/DBT,trauma and stress.    HPI:   Medical history including chronic back pain, headaches, hypertension, diabetes, anxiety idiopathic intercranial hypertension presents  with complaints of possible fall.  HPI is limited as patient was altered during my examination, she states that she thinks she fell but is unsure how she fell or when she fell, she notes that she has a headache feels dizzy and has nausea, she also notes that her right ankle and foot hurts.   Per triage notes patient had a fall, imaging was ordered for the right ankle and then a CT head was ordered later as she was endorsing worsening headaches and she notes that she had a head trauma.  At time of triage patient was alert and orient x4 and is able to answer all questions without difficulty  Past Psychiatric History:   Risk to Self:  Denies Risk to Others:   Denies Prior Inpatient Therapy:  Denies Prior Outpatient Therapy:   Denies  Past Medical History:  Past Medical History:  Diagnosis Date   Anxiety    Chronic back pain    Depression    Gestational diabetes    glyburide   Headache    Hypertension    Idiopathic intracranial hypertension    Leukocytosis 02/25/2015   Obesity    Pregnancy induced hypertension    labetalol    Past Surgical History:  Procedure Laterality Date   CESAREAN SECTION N/A 01/29/2019   Procedure: CESAREAN SECTION;  Surgeon: [name], DO;  Location:  [facility] LD ORS;  Service: Obstetrics;  Laterality: N/A;   CHOLECYSTECTOMY     LUMBAR PUNCTURE  2014   5x   Family History:  Family History  Problem Relation Age of Onset   Hypertension Mother    Breast cancer Mother    Pancreatic cancer Mother    Diabetes Mother    Hypertension Father    Hypertension Maternal Grandmother    Heart disease Maternal Grandmother    Hypertension Maternal Grandfather    Diabetes Maternal Grandfather    Colon  cancer Paternal Grandmother    Heart disease Paternal Grandfather    Hypertension Maternal Aunt    Breast cancer Maternal Aunt    Diabetes Maternal Aunt    Hypertension Maternal Uncle    Liver cancer Maternal Uncle    Prostate cancer Maternal Uncle    Miscarriages / Stillbirths Daughter    Family Psychiatric  History: Noncontributory.  Patient does report mother has latent diagnosis of headaches.  No known psychiatric diagnoses. Social History:  Social History   Substance and Sexual Activity  Alcohol Use Yes   Comment: socially     Social History   Substance and Sexual Activity  Drug Use No    Social History   Socioeconomic History   Marital status: Legally Separated    Spouse name: Not on file   Number of children: 2   Years of education: Not on file   Highest education level: Associate degree: occupational, Hotel manager, or vocational program  Occupational History   Occupation: Nurse  Tobacco Use   Smoking status: Never   Smokeless tobacco: Never  Vaping Use   Vaping Use: Never used  Substance and Sexual Activity   Alcohol use: Yes    Comment: socially   Drug use: No   Sexual activity: Not Currently    Birth control/protection: None  Other Topics Concern   Not on file  Social History Narrative   Not on file   Social Determinants of Health   Financial Resource Strain: Low Risk  (01/21/2019)   Overall Financial Resource Strain (CARDIA)    Difficulty of Paying Living Expenses: Not hard at all  Food Insecurity: No Food Insecurity (01/21/2019)   Hunger Vital Sign    Worried About Running Out of Food in the Last Year: Never true    [place] in the Last Year: Never true  Transportation Needs: No Transportation Needs (08/26/2020)   PRAPARE - Hydrologist (Medical): No    Lack of Transportation (Non-Medical): No  Physical Activity: Not on file  Stress: Stress Concern Present (01/21/2019)   [place]    Feeling of Stress : To some extent  Social Connections: Not on file   Additional Social History:    Allergies:   Allergies  Allergen Reactions   Elavil [Amitriptyline Hcl] Shortness Of Breath   Restoril [Temazepam] Shortness Of Breath   Trazodone And Nefazodone Anaphylaxis   Diamox [Acetazolamide] Other (See Comments)    Cold chills/loss of taste    Tomato Swelling and Other (See Comments)    Causes lips to swell   Maxalt [Rizatriptan Benzoate] Other (See Comments)    Chest tightness and respiratory distress   Norco [Hydrocodone-Acetaminophen] Itching   Pollen Extract Other (See Comments)    Unknown reaction    Benzocaine Swelling, Rash and Other (See Comments)    Localized swelling   Latex Rash   Novocain [Procaine] Swelling, Rash  and Other (See Comments)    Localized swelling    Labs:  Results for orders placed or performed during the hospital encounter of 05/14/22 (from the past 48 hour(s))  Basic metabolic panel     Status: Abnormal   Collection Time: 05/15/22  1:15 AM  Result Value Ref Range   Sodium 143 135 - 145 mmol/L   Potassium 3.2 (L) 3.5 - 5.1 mmol/L   Chloride 104 98 - 111 mmol/L   CO2 24 22 - 32 mmol/L   Glucose, Bld 111 (H) 70 - 99 mg/dL    Comment: Glucose reference range applies only to samples taken after fasting for at least 8 hours.   BUN 15 6 - 20 mg/dL   Creatinine, Ser 0.89 0.44 - 1.00 mg/dL   Calcium 9.7 8.9 - 10.3 mg/dL   GFR, Estimated >60 >60 mL/min    Comment: (NOTE) Calculated using the CKD-EPI Creatinine Equation (2021)    Anion gap 15 5 - 15    Comment: Performed at [facility], [place] [address] [place], [place] [address]  CBC with Differential     Status: Abnormal   Collection Time: 05/15/22  1:15 AM  Result Value Ref Range   WBC 11.3 (H) 4.0 - 10.5 K/uL   RBC 4.40 3.87 - 5.11 MIL/uL   Hemoglobin 13.6 12.0 - 15.0 g/dL   HCT 39.5 36.0 - 46.0 %   MCV 89.8 80.0 - 100.0 fL    MCH 30.9 26.0 - 34.0 pg   MCHC 34.4 30.0 - 36.0 g/dL   RDW 13.5 11.5 - 15.5 %   Platelets 297 150 - 400 K/uL   nRBC 0.0 0.0 - 0.2 %   Neutrophils Relative % 55 %   Neutro Abs 6.3 1.7 - 7.7 K/uL   Lymphocytes Relative 34 %   Lymphs Abs 3.9 0.7 - 4.0 K/uL   Monocytes Relative 8 %   Monocytes Absolute 0.9 0.1 - 1.0 K/uL   Eosinophils Relative 1 %   Eosinophils Absolute 0.1 0.0 - 0.5 K/uL   Basophils Relative 1 %   Basophils Absolute 0.1 0.0 - 0.1 K/uL   Immature Granulocytes 1 %   Abs Immature Granulocytes 0.12 (H) 0.00 - 0.07 K/uL    Comment: Performed at [facility], [place] [address], [place], [place] [address]  Ethanol     Status: None   Collection Time: 05/15/22  1:15 AM  Result Value Ref Range   Alcohol, Ethyl (B) <10 <10 mg/dL    Comment: (NOTE) Lowest detectable limit for serum alcohol is 10 mg/dL.  For medical purposes only. Performed at [facility], [place] [address] [place], [place] [address]   Magnesium     Status: None   Collection Time: 05/15/22  1:15 AM  Result Value Ref Range   Magnesium 2.2 1.7 - 2.4 mg/dL    Comment: Performed at [facility], [place] [address] [place], [place] [address]  Phosphorus     Status: None   Collection Time: 05/15/22  1:15 AM  Result Value Ref Range   Phosphorus 3.6 2.5 - 4.6 mg/dL    Comment: Performed at [facility], [place] [address], [place], [place] [address]  Hepatic function panel     Status: None   Collection Time: 05/15/22  1:15 AM  Result Value Ref Range   Total Protein 7.8 6.5 - 8.1 g/dL   Albumin 4.1 3.5 - 5.0 g/dL   AST 19 15 -  41 U/L   ALT 20 0 - 44 U/L   Alkaline Phosphatase 68 38 - 126 U/L   Total Bilirubin 1.0 0.3 - 1.2 mg/dL   Bilirubin, Direct 0.1 0.0 - 0.2 mg/dL   Indirect Bilirubin 0.9 0.3 - 0.9 mg/dL    Comment: Performed at [facility], [place] [address], [place], [place] [address]  I-Stat beta hCG blood, ED      Status: None   Collection Time: 05/15/22  1:18 AM  Result Value Ref Range   I-stat hCG, quantitative <5.0 <5 mIU/mL   Comment 3            Comment:   GEST. AGE      CONC.  (mIU/mL)   <=1 WEEK        5 - 50     2 WEEKS       50 - 500     3 WEEKS       100 - 10,000     4 WEEKS     1,000 - 30,000        FEMALE AND NON-PREGNANT FEMALE:     LESS THAN 5 mIU/mL   Ammonia     Status: None   Collection Time: 05/15/22  1:33 AM  Result Value Ref Range   Ammonia 13 9 - 35 umol/L    Comment: Performed at [facility], [place] [address] [place], [place] [address]  Urinalysis, Routine w reflex microscopic     Status: Abnormal   Collection Time: 05/15/22  5:12 AM  Result Value Ref Range   Color, Urine YELLOW YELLOW   APPearance CLOUDY (A) CLEAR   Specific Gravity, Urine 1.025 1.005 - 1.030   pH 5.0 5.0 - 8.0   Glucose, UA NEGATIVE NEGATIVE mg/dL   Hgb urine dipstick NEGATIVE NEGATIVE   Bilirubin Urine NEGATIVE NEGATIVE   Ketones, ur NEGATIVE NEGATIVE mg/dL   Protein, ur NEGATIVE NEGATIVE mg/dL   Nitrite NEGATIVE NEGATIVE   Leukocytes,Ua NEGATIVE NEGATIVE    Comment: Performed at [place] [address] [place], [place] [address]  HIV Antibody (routine testing w rflx)     Status: None   Collection Time: 05/15/22  5:25 AM  Result Value Ref Range   HIV Screen 4th Generation wRfx Non Reactive Non Reactive    Comment: Performed at [facility], [place] [address], [place], [place] [address]  TSH     Status: None   Collection Time: 05/15/22  7:43 AM  Result Value Ref Range   TSH 1.432 0.350 - 4.500 uIU/mL    Comment: Performed by a 3rd Generation assay with a functional sensitivity of <=0.01 uIU/mL. Performed at [facility], [place] [address] [place], [place] [address]   Vitamin B12     Status: None   Collection Time: 05/15/22  7:43 AM  Result Value Ref Range   Vitamin B-12 303 180 - 914 pg/mL    Comment: (NOTE) This assay is  not validated for testing neonatal or myeloproliferative syndrome specimens for Vitamin B12 levels. Performed at [facility], [place] [address], [place], [place] [address]   Urine rapid drug screen (hosp performed)     Status: Abnormal   Collection Time: 05/15/22  8:15 AM  Result Value Ref Range   Opiates POSITIVE (A) NONE DETECTED   Cocaine NONE DETECTED NONE DETECTED   Benzodiazepines NONE DETECTED NONE DETECTED   Amphetamines NONE DETECTED NONE DETECTED   Tetrahydrocannabinol  NONE DETECTED NONE DETECTED   Barbiturates NONE DETECTED NONE DETECTED    Comment: (NOTE) DRUG SCREEN FOR MEDICAL PURPOSES ONLY.  IF CONFIRMATION IS NEEDED FOR ANY PURPOSE, NOTIFY LAB WITHIN 5 DAYS.  LOWEST DETECTABLE LIMITS FOR URINE DRUG SCREEN Drug Class                     Cutoff (ng/mL) Amphetamine and metabolites    1000 Barbiturate and metabolites    200 Benzodiazepine                 408 Tricyclics and metabolites     300 Opiates and metabolites        300 Cocaine and metabolites        300 THC                            50 Performed at [facility], [place] [address] [place], [place] [address]   CBG monitoring, ED     Status: Abnormal   Collection Time: 05/15/22 11:47 AM  Result Value Ref Range   Glucose-Capillary 139 (H) 70 - 99 mg/dL    Comment: Glucose reference range applies only to samples taken after fasting for at least 8 hours.  Hemoglobin A1c     Status: Abnormal   Collection Time: 05/15/22  3:49 PM  Result Value Ref Range   Hgb A1c MFr Bld 5.9 (H) 4.8 - 5.6 %    Comment: (NOTE) Pre diabetes:          5.7%-6.4%  Diabetes:              >6.4%  Glycemic control for   <7.0% adults with diabetes    Mean Plasma Glucose 122.63 mg/dL    Comment: Performed at [place] [address] [place], [place] [address]  Glucose, capillary     Status: Abnormal   Collection Time: 05/15/22  4:29 PM  Result Value Ref Range   Glucose-Capillary 134  (H) 70 - 99 mg/dL    Comment: Glucose reference range applies only to samples taken after fasting for at least 8 hours.  Glucose, capillary     Status: Abnormal   Collection Time: 05/15/22  9:22 PM  Result Value Ref Range   Glucose-Capillary 125 (H) 70 - 99 mg/dL    Comment: Glucose reference range applies only to samples taken after fasting for at least 8 hours.  Glucose, capillary     Status: Abnormal   Collection Time: 05/16/22  7:47 AM  Result Value Ref Range   Glucose-Capillary 128 (H) 70 - 99 mg/dL    Comment: Glucose reference range applies only to samples taken after fasting for at least 8 hours.  CBC     Status: None   Collection Time: 05/16/22 10:18 AM  Result Value Ref Range   WBC 8.8 4.0 - 10.5 K/uL   RBC 4.11 3.87 - 5.11 MIL/uL   Hemoglobin 12.6 12.0 - 15.0 g/dL   HCT 37.6 36.0 - 46.0 %   MCV 91.5 80.0 - 100.0 fL   MCH 30.7 26.0 - 34.0 pg   MCHC 33.5 30.0 - 36.0 g/dL   RDW 13.7 11.5 - 15.5 %   Platelets 293 150 - 400 K/uL   nRBC 0.0 0.0 - 0.2 %    Comment: Performed at [facility], [place] [address], [place], [place] [address]  Basic metabolic panel  Status: Abnormal   Collection Time: 05/16/22 10:18 AM  Result Value Ref Range   Sodium 138 135 - 145 mmol/L   Potassium 3.5 3.5 - 5.1 mmol/L    Comment: HEMOLYSIS AT THIS LEVEL MAY AFFECT RESULT   Chloride 108 98 - 111 mmol/L   CO2 23 22 - 32 mmol/L   Glucose, Bld 130 (H) 70 - 99 mg/dL    Comment: Glucose reference range applies only to samples taken after fasting for at least 8 hours.   BUN 17 6 - 20 mg/dL   Creatinine, Ser 0.85 0.44 - 1.00 mg/dL   Calcium 9.2 8.9 - 10.3 mg/dL   GFR, Estimated >60 >60 mL/min    Comment: (NOTE) Calculated using the CKD-EPI Creatinine Equation (2021)    Anion gap 7 5 - 15    Comment: Performed at [facility], [place] [address], [place], [place] [address]  Glucose, capillary     Status: None   Collection Time: 05/16/22 11:51 AM  Result Value  Ref Range   Glucose-Capillary 92 70 - 99 mg/dL    Comment: Glucose reference range applies only to samples taken after fasting for at least 8 hours.    Current Facility-Administered Medications  Medication Dose Route Frequency Provider Last Rate Last Admin   acetaminophen (TYLENOL) tablet 650 mg  650 mg Oral Q6H PRN Irene Pap N, DO       insulin aspart (novoLOG) injection 0-15 Units  0-15 Units Subcutaneous TID WC Reubin Milan, MD   2 Units at 05/16/22 2671   ketorolac (TORADOL) 30 MG/ML injection 30 mg  30 mg Intravenous Q6H PRN Reubin Milan, MD   30 mg at 05/16/22 0523   melatonin tablet 5 mg  5 mg Oral QHS PRN Irene Pap N, DO   5 mg at 05/15/22 2105   metoCLOPramide (REGLAN) injection 10 mg  10 mg Intravenous Q6H Reubin Milan, MD   10 mg at 05/16/22 1208   Oral care mouth rinse  15 mL Mouth Rinse PRN Reubin Milan, MD       oxyCODONE-acetaminophen (PERCOCET/ROXICET) 5-325 MG per tablet 1 tablet  1 tablet Oral Q4H PRN Reubin Milan, MD   1 tablet at 05/16/22 1209   And   oxyCODONE (Oxy IR/ROXICODONE) immediate release tablet 5 mg  5 mg Oral Q4H PRN Reubin Milan, MD   5 mg at 05/16/22 1209   oxyCODONE-acetaminophen (PERCOCET/ROXICET) 5-325 MG per tablet 1 tablet  1 tablet Oral Q4H PRN Reubin Milan, MD   1 tablet at 05/15/22 1226   pantoprazole (PROTONIX) EC tablet 40 mg  40 mg Oral BID Reubin Milan, MD   40 mg at 05/16/22 0901   polyethylene glycol (MIRALAX / GLYCOLAX) packet 17 g  17 g Oral Daily PRN Irene Pap N, DO       pregabalin (LYRICA) capsule 75 mg  75 mg Oral BID Reubin Milan, MD   75 mg at 05/16/22 0901   prochlorperazine (COMPAZINE) injection 10 mg  10 mg Intravenous Q6H PRN Irene Pap N, DO   10 mg at 05/15/22 0805   triamterene-hydrochlorothiazide (MAXZIDE) 75-50 MG per tablet 1 tablet  1 tablet Oral Daily Reubin Milan, MD   1 tablet at 05/16/22 0901    Musculoskeletal: Strength & Muscle Tone: within  normal limits Gait & Station: normal Patient leans: Front            Psychiatric Specialty Exam:  Presentation  General Appearance: Appropriate for Environment; Casual  Eye Contact:Fair  Speech:Clear and Coherent; Normal Rate  Speech Volume:Normal  Handedness:Right   Mood and Affect  Mood:Euthymic  Affect:Congruent; Appropriate   Thought Process  Thought Processes:Coherent; Linear  Descriptions of Associations:Intact  Orientation:Full (Time, Place and Person)  Thought Content:Logical  History of Schizophrenia/Schizoaffective disorder:No data recorded Duration of Psychotic Symptoms:No data recorded Hallucinations:Hallucinations: None  Ideas of Reference:None  Suicidal Thoughts:Suicidal Thoughts: No  Homicidal Thoughts:Homicidal Thoughts: No   Sensorium  Memory:Immediate Good; Recent Good; Remote Good  Judgment:Fair  Insight:Fair   Executive Functions  Concentration:Good  Attention Span:Fair  [place] of Knowledge:Good  Language:Good   Psychomotor Activity  Psychomotor Activity:Psychomotor Activity: Normal   Assets  Assets:Desire for Improvement; Financial Resources/Insurance; Housing; Intimacy; Social Support; Physical Health; Armed forces logistics/support/administrative officer; Resilience   Sleep  Sleep:Sleep: Fair   Physical Exam: Physical Exam Vitals and nursing note reviewed.  Constitutional:      Appearance: Normal appearance. She is obese.  Neurological:     General: No focal deficit present.     Mental Status: She is alert and oriented to person, place, and time. Mental status is at baseline.     Deep Tendon Reflexes: Reflexes are normal and symmetric.  Psychiatric:        Mood and Affect: Mood normal.        Behavior: Behavior normal.        Thought Content: Thought content normal.        Judgment: Judgment normal.    Review of Systems  Psychiatric/Behavioral:  The patient is nervous/anxious.   All other systems reviewed and are  negative.  Blood pressure 130/89, pulse 73, temperature 98 F (36.7 C), temperature source Oral, resp. rate 18, height '5\' 1"'$  (1.549 m), weight 86.2 kg, last menstrual period 04/27/2022, SpO2 100 %. Body mass index is 35.9 kg/m.  Patient was brought in to [facility] via EMS, after a fall and right ankle injury.  Patient denies any acute psychiatric symptoms.  She does endorse a previous psychiatric history of depression and anxiety, that was recently stable as far as she knows.  She presents with much improvement today, and appears to be psychiatric intact and at baseline. 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 We did review the incidence and etiology behind GTA, and ways to prevent another episode.   Treatment Plan Summary: Plan recommend neurology do complete evaluation to include visual eye examination.  Graceann Congress referral for outpatient psychiatric services to include cognitive behavioral therapy it [facility]. -Discussed with admitting team, neurology evaluation recommendations.  -Patient will participate in psychotherapy:  AVS updated to reflect [facility] [place] -Will continue to monitor patient's mood and behavior.  -At present there are no known barriers to prevent psychiatric discharge.  Disposition: No evidence of imminent risk to self or others at present.   Patient does not meet criteria for psychiatric inpatient admission. Supportive therapy provided about ongoing stressors. Refer to IOP. Discussed crisis plan, support from social network, calling 911, coming to the Emergency Department, and calling Suicide Hotline.  Suella Broad, FNP 05/16/2022 12:34 PM

## 2022-05-16 NOTE — Discharge Summary (Signed)
Physician Discharge Summary  Tracey Beard KPT:[id number] DOB: 1981/05/27 DOA: 05/14/2022  PCP: Dian Queen, MD  Admit date: 05/14/2022 Discharge date: 05/16/2022  Admitted From: Home Disposition: Home  Recommendations for Outpatient Follow-up:  Follow up with PCP in 1 week  Outpatient follow-up with orthopedics Outpatient follow-up with ophthalmology Recommend outpatient follow-up with psychiatry Follow up in ED if symptoms worsen or new appear   Home Health: No Equipment/Devices: None  Discharge Condition: Stable CODE STATUS: Full Diet recommendation: Regular  brief/Interim Summary: 41 y.o. female with medical history significant of anxiety, chronic back pain, depression, gestational diabetes, idiopathic intracranial hypertension, pregnancy-induced hypertension presented with confusion/amnesia, right ankle pain after falling at home.  On presentation, UDS was positive for opiates; ammonia/alcohol levels were negative; TSH/B12 were normal and LFTs were normal.  CT of the head and cervical spine did not show any acute abnormality.  Right ankle x-ray showed small nondisplaced fracture of the distal tip of anterior tibia.  Right foot x-ray and right hip x-ray were negative.  No abnormalities were seen on CTA head and neck.  She was evaluated by psychiatry and neurology.  Psychiatry recommended outpatient follow-up.  MRI of brain was negative for acute abnormality.  Neurology has cleared the patient for discharge.  She will be discharged home today with outpatient follow-up with psychiatry along with orthopedics.  Discharge Diagnoses:   Amnesia, possibly from concussion -Memory much improved as per the patient.  Feels ready to go home today. -Work-up including imaging unremarkable as above. -Neurology has cleared the patient for discharge.  Psychiatry also has cleared the patient for discharge. -Discharge patient home today.  Idiopathic intracranial hypertension -Neurology  recommends outpatient follow-up with ophthalmology and referral back to neurology if papilledema is detected -Currently not taking dexamethasone.  Outpatient follow-up  Right tibial fracture -Nondisplaced.  Continue boot.  Outpatient follow-up with orthopedics.  Fall at home -PT consulted.  Hypertension -Blood pressure stable.  Continue home regimen.  Obesity -Outpatient follow-up    Discharge Instructions  Discharge Instructions     Diet general   Complete by: As directed    Increase activity slowly   Complete by: As directed       Allergies as of 05/16/2022       Reactions   Elavil [amitriptyline Hcl] Shortness Of Breath   Restoril [temazepam] Shortness Of Breath   Trazodone And Nefazodone Anaphylaxis   Diamox [acetazolamide] Other (See Comments)   Cold chills/loss of taste    Tomato Swelling, Other (See Comments)   Causes lips to swell   Maxalt [rizatriptan Benzoate] Other (See Comments)   Chest tightness and respiratory distress   Norco [hydrocodone-acetaminophen] Itching   Pollen Extract Other (See Comments)   Unknown reaction    Benzocaine Swelling, Rash, Other (See Comments)   Localized swelling   Latex Rash   Novocain [procaine] Swelling, Rash, Other (See Comments)   Localized swelling        Medication List     STOP taking these medications    dexamethasone 4 MG tablet Commonly known as: DECADRON       TAKE these medications    BETAMETHASONE DIPROPIONATE EX Apply 1 Application topically 2 (two) times daily as needed (Lycan plantis).   chlorhexidine 0.12 % solution Commonly known as: PERIDEX Use as directed 15 mLs in the mouth or throat 2 (two) times daily.   esomeprazole 40 MG capsule Commonly known as: NEXIUM Take 1 capsule (40 mg total) by mouth 2 (two) times daily before a meal.  ibuprofen 800 MG tablet Commonly known as: ADVIL Take 1 tablet (800 mg total) by mouth every 6 (six) hours as needed for moderate pain.    oxyCODONE-acetaminophen 10-325 MG tablet Commonly known as: PERCOCET Take 1 tablet by mouth every 6 (six) hours as needed for pain. What changed: how much to take   pregabalin 75 MG capsule Commonly known as: LYRICA Take 75 mg by mouth 2 (two) times daily.   tiZANidine 4 MG tablet Commonly known as: ZANAFLEX Take 4 mg by mouth 3 (three) times daily as needed for muscle spasms.   triamterene-hydrochlorothiazide 75-50 MG tablet Commonly known as: MAXZIDE Take 1 tablet by mouth daily.        Follow-up Information     Dian Queen, MD. Schedule an appointment as soon as possible for a visit in 1 week(s).   Specialty: Obstetrics and Gynecology Contact information: [address] [place] [address] [phone]         Ophthalmologist. Schedule an appointment as soon as possible for a visit in 1 week(s).                 Allergies  Allergen Reactions   Elavil [Amitriptyline Hcl] Shortness Of Breath   Restoril [Temazepam] Shortness Of Breath   Trazodone And Nefazodone Anaphylaxis   Diamox [Acetazolamide] Other (See Comments)    Cold chills/loss of taste    Tomato Swelling and Other (See Comments)    Causes lips to swell   Maxalt [Rizatriptan Benzoate] Other (See Comments)    Chest tightness and respiratory distress   Norco [Hydrocodone-Acetaminophen] Itching   Pollen Extract Other (See Comments)    Unknown reaction    Benzocaine Swelling, Rash and Other (See Comments)    Localized swelling   Latex Rash   Novocain [Procaine] Swelling, Rash and Other (See Comments)    Localized swelling    Consultations: Neurology/psychiatry   Procedures/Studies: MR BRAIN WO CONTRAST  Result Date: 05/16/2022 CLINICAL DATA:  Mental status change with unknown cause EXAM: MRI HEAD WITHOUT CONTRAST TECHNIQUE: Multiplanar, multiecho pulse sequences of the brain and surrounding structures were obtained without intravenous contrast. COMPARISON:  CTA of the  head neck from earlier today FINDINGS: Brain: No acute infarction, hemorrhage, hydrocephalus, extra-axial collection or mass lesion. Vascular: Normal flow voids. Skull and upper cervical spine: Normal marrow signal. Sinuses/Orbits: Negative. Other: Fast brain protocol was used. IMPRESSION: Negative brain MRI. Electronically Signed   By: Jorje Guild M.D.   On: 05/16/2022 07:48   CT ANGIO HEAD NECK W WO CM  Result Date: 05/15/2022 CLINICAL DATA:  Altered mental status with fall. Neck trauma, dangerous injury mechanism. EXAM: CT ANGIOGRAPHY HEAD AND NECK TECHNIQUE: Multidetector CT imaging of the head and neck was performed using the standard protocol during bolus administration of intravenous contrast. Multiplanar CT image reconstructions and MIPs were obtained to evaluate the vascular anatomy. Carotid stenosis measurements (when applicable) are obtained utilizing NASCET criteria, using the distal internal carotid diameter as the denominator. RADIATION DOSE REDUCTION: This exam was performed according to the departmental dose-optimization program which includes automated exposure control, adjustment of the mA and/or kV according to patient size and/or use of iterative reconstruction technique. CONTRAST:  39m OMNIPAQUE IOHEXOL 350 MG/ML SOLN COMPARISON:  Head and cervical spine CT from yesterday FINDINGS: CT HEAD FINDINGS Brain: Swirling type low-density over the left cerebral hemisphere is attributed to artifact. This area is again covered on CTA with non persistence. No evidence of acute infarct, hemorrhage, hydrocephalus, or mass.  Hair appliance causes streak artifact. Vascular: See below Skull: Negative Sinuses/Orbits: Negative Review of the MIP images confirms the above findings CTA NECK FINDINGS Aortic arch: Normal Right carotid system: Vessels are smooth and widely patent without atheromatous changes Left carotid system: Vessels are smooth and widely patent without atheromatous changes. Vertebral  arteries: Proximal subclavian and vertebral arteries are widely patent and smoothly contoured. Skeleton: No acute finding Other neck: No visible injury Upper chest: Clear apical lungs Review of the MIP images confirms the above findings CTA HEAD FINDINGS Anterior circulation: No branch occlusion, beading, aneurysm, or proximal and flow limiting stenosis. Posterior circulation: No significant stenosis, proximal occlusion, aneurysm, or vascular malformation. Venous sinuses: Diffusely patent Anatomic variants: None significant Review of the MIP images confirms the above findings IMPRESSION: Negative CTA of the head and neck. Electronically Signed   By: Jorje Guild M.D.   On: 05/15/2022 05:24   DG Hip Unilat W or Wo Pelvis 2-3 Views Right  Result Date: 05/15/2022 CLINICAL DATA:  Right hip pain following fall today, initial encounter EXAM: DG HIP (WITH OR WITHOUT PELVIS) 3V RIGHT COMPARISON:  None Available. FINDINGS: Pelvic ring is intact. No acute fracture or dislocation is noted. No soft tissue abnormality is seen. IMPRESSION: No acute abnormality noted. Electronically Signed   By: Inez Catalina M.D.   On: 05/15/2022 02:49   CT Cervical Spine Wo Contrast  Result Date: 05/15/2022 CLINICAL DATA:  Fall EXAM: CT CERVICAL SPINE WITHOUT CONTRAST TECHNIQUE: Multidetector CT imaging of the cervical spine was performed without intravenous contrast. Multiplanar CT image reconstructions were also generated. RADIATION DOSE REDUCTION: This exam was performed according to the departmental dose-optimization program which includes automated exposure control, adjustment of the mA and/or kV according to patient size and/or use of iterative reconstruction technique. COMPARISON:  None Available. FINDINGS: Alignment: No static subluxation. Facets are aligned. Occipital condyles and the lateral masses of C1 and C2 are normally approximated. Skull base and vertebrae: No acute fracture. Soft tissues and spinal canal: No  prevertebral fluid or swelling. No visible canal hematoma. Disc levels: No advanced spinal canal or neural foraminal stenosis. Upper chest: No pneumothorax, pulmonary nodule or pleural effusion. Other: Normal visualized paraspinal cervical soft tissues. IMPRESSION: No acute fracture or static subluxation of the cervical spine. Electronically Signed   By: Ulyses Jarred M.D.   On: 05/15/2022 00:49   DG Foot Complete Right  Result Date: 05/15/2022 CLINICAL DATA:  Twisted ankle EXAM: RIGHT FOOT COMPLETE - 3+ VIEW COMPARISON:  None Available. FINDINGS: There is no evidence of fracture or dislocation. There is no evidence of arthropathy or other focal bone abnormality. Soft tissues are unremarkable. IMPRESSION: Negative. Electronically Signed   By: Ulyses Jarred M.D.   On: 05/15/2022 00:47   CT Head Wo Contrast  Result Date: 05/14/2022 CLINICAL DATA:  Headache, dizziness EXAM: CT HEAD WITHOUT CONTRAST TECHNIQUE: Contiguous axial images were obtained from the base of the skull through the vertex without intravenous contrast. RADIATION DOSE REDUCTION: This exam was performed according to the departmental dose-optimization program which includes automated exposure control, adjustment of the mA and/or kV according to patient size and/or use of iterative reconstruction technique. COMPARISON:  01/14/2016 FINDINGS: Motion degraded images. Brain: No evidence of acute infarction, hemorrhage, hydrocephalus, extra-axial collection or mass lesion/mass effect. Vascular: No hyperdense vessel or unexpected calcification. Skull: Normal. Negative for fracture or focal lesion. Sinuses/Orbits: The visualized paranasal sinuses are essentially clear. The mastoid air cells are unopacified. Other: None. IMPRESSION: Motion degraded images. No evidence  of acute intracranial abnormality. Electronically Signed   By: Julian Hy M.D.   On: 05/14/2022 22:44   DG Ankle Complete Right  Result Date: 05/14/2022 CLINICAL DATA:  Status  post fall. EXAM: RIGHT ANKLE - COMPLETE 3+ VIEW COMPARISON:  None Available. FINDINGS: A very small cortical defect is seen along the distal tip of the anterior right tibia. There is no evidence of dislocation. A mild amount of adjacent soft tissue swelling is noted. IMPRESSION: Very small nondisplaced fracture involving the distal tip of the anterior right tibia. Electronically Signed   By: Virgina Norfolk M.D.   On: 05/14/2022 19:21      Subjective: Patient seen and examined at bedside.  No overnight fever, vomiting, chest pain reported.  Feels that her memory is improving.  Feels okay to go home today.  Discharge Exam: Vitals:   05/16/22 0016 05/16/22 0428  BP: (!) 124/93 130/89  Pulse: 96 73  Resp:  18  Temp: 97.8 F (36.6 C) 98 F (36.7 C)  SpO2: 100% 100%    General: Pt is alert, awake, not in acute distress.  Currently on room air. Cardiovascular: rate controlled, S1/S2 + Respiratory: bilateral decreased breath sounds at bases Abdominal: Soft, obese, NT, ND, bowel sounds + Extremities: no edema, no cyanosis    The results of significant diagnostics from this hospitalization (including imaging, microbiology, ancillary and laboratory) are listed below for reference.     Microbiology: No results found for this or any previous visit (from the past 240 hour(s)).   Labs: BNP (last 3 results) No results for input(s): "BNP" in the last 8760 hours. Basic Metabolic Panel: Recent Labs  Lab 05/15/22 0115  NA 143  K 3.2*  CL 104  CO2 24  GLUCOSE 111*  BUN 15  CREATININE 0.89  CALCIUM 9.7  MG 2.2  PHOS 3.6   Liver Function Tests: Recent Labs  Lab 05/15/22 0115  AST 19  ALT 20  ALKPHOS 68  BILITOT 1.0  PROT 7.8  ALBUMIN 4.1   No results for input(s): "LIPASE", "AMYLASE" in the last 168 hours. Recent Labs  Lab 05/15/22 0133  AMMONIA 13   CBC: Recent Labs  Lab 05/15/22 0115  WBC 11.3*  NEUTROABS 6.3  HGB 13.6  HCT 39.5  MCV 89.8  PLT 297    Cardiac Enzymes: No results for input(s): "CKTOTAL", "CKMB", "CKMBINDEX", "TROPONINI" in the last 168 hours. BNP: Invalid input(s): "POCBNP" CBG: Recent Labs  Lab 05/15/22 1147 05/15/22 1629 05/15/22 2122 05/16/22 0747  GLUCAP 139* 134* 125* 128*   D-Dimer No results for input(s): "DDIMER" in the last 72 hours. Hgb A1c Recent Labs    05/15/22 1549  HGBA1C 5.9*   Lipid Profile No results for input(s): "CHOL", "HDL", "LDLCALC", "TRIG", "CHOLHDL", "LDLDIRECT" in the last 72 hours. Thyroid function studies Recent Labs    05/15/22 0743  TSH 1.432   Anemia work up Recent Labs    05/15/22 0743  VITAMINB12 303   Urinalysis    Component Value Date/Time   COLORURINE YELLOW 05/15/2022 0512   APPEARANCEUR CLOUDY (A) 05/15/2022 0512   LABSPEC 1.025 05/15/2022 0512   PHURINE 5.0 05/15/2022 0512   GLUCOSEU NEGATIVE 05/15/2022 0512   HGBUR NEGATIVE 05/15/2022 0512   BILIRUBINUR NEGATIVE 05/15/2022 0512   KETONESUR NEGATIVE 05/15/2022 0512   PROTEINUR NEGATIVE 05/15/2022 0512   UROBILINOGEN 0.2 01/13/2012 2101   NITRITE NEGATIVE 05/15/2022 0512   LEUKOCYTESUR NEGATIVE 05/15/2022 0512   Sepsis Labs Recent Labs  Lab 05/15/22 0115  WBC 11.3*   Microbiology No results found for this or any previous visit (from the past 240 hour(s)).   Time coordinating discharge: 35 minutes  SIGNED:   Aline August, MD  [facility] 05/16/2022, 10:54 AM

## 2022-05-17 LAB — VITAMIN B1: Vitamin B1 (Thiamine): 140.8 nmol/L (ref 66.5–200.0)

## 2022-05-22 ENCOUNTER — Other Ambulatory Visit: Payer: Self-pay

## 2022-05-22 ENCOUNTER — Emergency Department (HOSPITAL_BASED_OUTPATIENT_CLINIC_OR_DEPARTMENT_OTHER)
Admission: EM | Admit: 2022-05-22 | Discharge: 2022-05-22 | Disposition: A | Discharge status: Home / Self Care | Payer: Self-pay | Attending: Emergency Medicine | Admitting: Emergency Medicine

## 2022-05-22 DIAGNOSIS — R197 Diarrhea, unspecified: Secondary | ICD-10-CM | POA: Insufficient documentation

## 2022-05-22 DIAGNOSIS — F1193 Opioid use, unspecified with withdrawal: Secondary | ICD-10-CM | POA: Insufficient documentation

## 2022-05-22 DIAGNOSIS — Z91040 Latex allergy status: Secondary | ICD-10-CM | POA: Insufficient documentation

## 2022-05-22 MED ORDER — ONDANSETRON HCL 4 MG PO TABS
4.0000 mg | ORAL_TABLET | Freq: Once | ORAL | Status: AC
  Administered 2022-05-22: 4 mg via ORAL
  Filled 2022-05-22: qty 1

## 2022-05-22 MED ORDER — OXYCODONE HCL 5 MG PO TABS
10.0000 mg | ORAL_TABLET | Freq: Once | ORAL | Status: AC
  Administered 2022-05-22: 10 mg via ORAL
  Filled 2022-05-22: qty 2

## 2022-05-22 MED ORDER — ONDANSETRON HCL 4 MG PO TABS: 4.0000 mg | ORAL_TABLET | Freq: Four times a day (QID) | ORAL | 0 refills | Status: AC

## 2022-05-22 MED ORDER — ACETAMINOPHEN 160 MG/5ML PO SOLN: 650.0000 mg | Freq: Once | ORAL | Status: DC

## 2022-05-22 MED ORDER — ACETAMINOPHEN 325 MG PO TABS
650.0000 mg | ORAL_TABLET | Freq: Once | ORAL | Status: AC
  Administered 2022-05-22: 650 mg via ORAL
  Filled 2022-05-22: qty 2

## 2022-05-22 MED ORDER — OXYCODONE HCL 5 MG PO TABS: 5.0000 mg | ORAL_TABLET | Freq: Four times a day (QID) | ORAL | 0 refills | Status: DC | PRN

## 2022-05-22 NOTE — ED Provider Notes (Signed)
[facility] Provider Note   CSN: [id number] Arrival date & time: 05/22/22  1914     History  Chief Complaint  Patient presents with   Emesis   Diarrhea    Tracey Beard is a 41 y.o. female.  Patient here with nausea, vomiting, diarrhea for the last several days since being out of her opioid pain medicine.  Patient was recently admitted to the hospital after injury but lost her medications while at her hospital stay.  She follows with the pain clinic but unable to get in with them for refill.  She is not due for refill for 2 weeks.  She has been trying to space out pain medications that were provided to her by primary care doctor but does not know what to do.  She is having withdrawal symptoms.  She denies any fevers or chills.  No abdominal pain.  Denies any weakness or numbness.  The history is provided by the patient.       Home Medications Prior to Admission medications   Medication Sig Start Date End Date Taking? Authorizing Provider  ondansetron (ZOFRAN) 4 MG tablet Take 1 tablet (4 mg total) by mouth every 6 (six) hours. 05/22/22  Yes Tracey Brinkley, DO  oxyCODONE (ROXICODONE) 5 MG immediate release tablet Take 1 tablet (5 mg total) by mouth every 6 (six) hours as needed for up to 30 doses for severe pain. 05/22/22  Yes Tracey Asay, DO  BETAMETHASONE DIPROPIONATE EX Apply 1 Application topically 2 (two) times daily as needed (Lycan plantis).    [provider]  chlorhexidine (PERIDEX) 0.12 % solution Use as directed 15 mLs in the mouth or throat 2 (two) times daily. 05/10/22   [provider]  esomeprazole (NEXIUM) 40 MG capsule Take 1 capsule (40 mg total) by mouth 2 (two) times daily before a meal. 03/24/21   Zehr, Janett Billow D, PA-C  ibuprofen (ADVIL) 800 MG tablet Take 1 tablet (800 mg total) by mouth every 6 (six) hours as needed for moderate pain. 02/02/19   Louretta Shorten, MD  oxyCODONE-acetaminophen (PERCOCET) 10-325 MG tablet Take  1 tablet by mouth every 6 (six) hours as needed for pain. 05/16/22   Aline August, MD  pregabalin (LYRICA) 75 MG capsule Take 75 mg by mouth 2 (two) times daily. 05/08/22   [provider]  tiZANidine (ZANAFLEX) 4 MG tablet Take 4 mg by mouth 3 (three) times daily as needed for muscle spasms. 04/05/22   [provider]  triamterene-hydrochlorothiazide (MAXZIDE) 75-50 MG tablet Take 1 tablet by mouth daily. 01/18/21   [provider]      Allergies    Elavil [amitriptyline hcl], Restoril [temazepam], Trazodone and nefazodone, Diamox [acetazolamide], Tomato, Maxalt [rizatriptan benzoate], Norco [hydrocodone-acetaminophen], Pollen extract, Benzocaine, Latex, and Novocain [procaine]    Review of Systems   Review of Systems  Physical Exam Updated Vital Signs BP (!) 155/95 (BP Location: Left Arm)   Pulse 96   Temp 100 F (37.8 C)   Resp 18   Ht '5\' 1"'$  (1.549 m)   Wt 86.2 kg   LMP 04/27/2022 (Approximate)   SpO2 100%   BMI 35.90 kg/m  Physical Exam Vitals and nursing note reviewed.  Constitutional:      General: She is not in acute distress.    Appearance: She is well-developed.  HENT:     Head: Normocephalic and atraumatic.     Nose: Nose normal.     Mouth/Throat:     Mouth:  Mucous membranes are moist.  Eyes:     Extraocular Movements: Extraocular movements intact.     Conjunctiva/sclera: Conjunctivae normal.     Pupils: Pupils are equal, round, and reactive to light.  Cardiovascular:     Rate and Rhythm: Normal rate and regular rhythm.     Pulses: Normal pulses.     Heart sounds: Normal heart sounds. No murmur heard. Pulmonary:     Effort: Pulmonary effort is normal. No respiratory distress.     Breath sounds: Normal breath sounds.  Abdominal:     Palpations: Abdomen is soft.     Tenderness: There is no abdominal tenderness.  Musculoskeletal:        General: No swelling.     Cervical back: Neck supple.  Skin:    General: Skin is warm and dry.      Capillary Refill: Capillary refill takes less than 2 seconds.  Neurological:     Mental Status: She is alert.  Psychiatric:        Mood and Affect: Mood normal.     ED Results / Procedures / Treatments   Labs (all labs ordered are listed, but only abnormal results are displayed) Labs Reviewed - No data to display  EKG None  Radiology No results found.  Procedures Procedures    Medications Ordered in ED Medications  ondansetron (ZOFRAN) tablet 4 mg (4 mg Oral Given 05/22/22 2016)  oxyCODONE (Oxy IR/ROXICODONE) immediate release tablet 10 mg (10 mg Oral Given 05/22/22 2016)    ED Course/ Medical Decision Making/ A&P                           Medical Decision Making Risk Prescription drug management.   Tracey Beard is here for medication refill, withdrawal symptoms.  Patient with history of chronic back pain, gets narcotic pain prescription every month from which I could see for the last several months.  Recently switched to a chronic pain team after primarily being dealt with by primary care doctor.  She has been without her narcotic pain medicine for the last day or 2 and having nausea, vomiting, diarrhea.  She has unremarkable vitals.  She is well-appearing.  She was recently admitted after some traumatic injuries.  She lost her medications while at the hospital.  Overall, this patient has received consistent narcotic scripts.  There have not been needs for prescription in between her monthly prescriptions over the course of the past year.  Ultimately had a long discussion with her about how to navigate this process.  Strongly urged her to talk to her chronic pain doctor about trying to help her out here.  Ultimately I felt very safe prescribing her narcotic pain medicine.  I will prescribe 5 mg tablets of oxycodone.  Recommend that she try to stretch out this medication is much as possible.  Ultimately I cannot prescribe much more than I am but she will be in significant  withdrawals without some narcotic pain medicine given the amount that she has been on for at least a year.  I feel that the patient is trustworthy.  I had a long discussion with her about how I am trusting her with this medication to use it appropriately.  Hopefully she can have her chronic pain team her primary care doctors help with her pain management until she can get her monthly prescription refilled.  This chart was dictated using voice recognition software.  Despite best efforts to proofread,  errors can occur which can change the documentation meaning.         Final Clinical Impression(s) / ED Diagnoses Final diagnoses:  Opioid use with withdrawal ([place])    Rx / DC Orders ED Discharge Orders          Ordered    oxyCODONE (ROXICODONE) 5 MG immediate release tablet  Every 6 hours PRN        05/22/22 2016    ondansetron (ZOFRAN) 4 MG tablet  Every 6 hours        05/22/22 2016              Lennice Sites, DO 05/22/22 2020

## 2022-05-22 NOTE — ED Triage Notes (Signed)
POV, pt sts that she recently lost her pain meds during hospital stay. 9/3 took last '20mg'$  dose of meds and since then she's been N/V/D, irritable, restless legs.   Takes '20mg'$  oxycodone q 6hrs, pain clinic pt for chronic back pain.  Pt alert and oriented x4, ambulatory with crutches due to recent injury/fall.

## 2022-05-22 NOTE — ED Notes (Signed)
Pt agreeable with d/c plan as discussed by provider - this nurse has verbally reinforced d/c instructions and provided pt with written copy - pt acknowledges verbal understanding and denies any add'l questions, concerns, needs -pt ambulatory independently at d/c using personal crutches with cam boot to L foot. No acute distress/changes at d/c

## 2022-11-15 ENCOUNTER — Other Ambulatory Visit: Payer: Self-pay | Admitting: Family

## 2022-11-15 DIAGNOSIS — N644 Mastodynia: Secondary | ICD-10-CM

## 2022-11-15 DIAGNOSIS — N6001 Solitary cyst of right breast: Secondary | ICD-10-CM

## 2022-11-15 DIAGNOSIS — N6002 Solitary cyst of left breast: Secondary | ICD-10-CM

## 2022-11-29 ENCOUNTER — Ambulatory Visit: Payer: Self-pay

## 2022-11-29 ENCOUNTER — Ambulatory Visit
Admission: RE | Admit: 2022-11-29 | Discharge: 2022-11-29 | Disposition: A | Discharge status: Home / Self Care | Payer: No Typology Code available for payment source | Source: Ambulatory Visit | Attending: Family | Admitting: Family

## 2022-11-29 DIAGNOSIS — N644 Mastodynia: Secondary | ICD-10-CM

## 2022-11-29 DIAGNOSIS — N6001 Solitary cyst of right breast: Secondary | ICD-10-CM

## 2022-11-29 DIAGNOSIS — N6002 Solitary cyst of left breast: Secondary | ICD-10-CM

## 2023-01-10 ENCOUNTER — Ambulatory Visit: Payer: No Typology Code available for payment source | Admitting: Internal Medicine

## 2023-01-10 ENCOUNTER — Encounter: Payer: Self-pay | Admitting: Internal Medicine

## 2023-01-10 VITALS — BP 160/102 | HR 105 | Temp 99.9°F | Ht 61.0 in | Wt 233.8 lb

## 2023-01-10 DIAGNOSIS — F411 Generalized anxiety disorder: Secondary | ICD-10-CM | POA: Diagnosis not present

## 2023-01-10 DIAGNOSIS — R7302 Impaired glucose tolerance (oral): Secondary | ICD-10-CM

## 2023-01-10 DIAGNOSIS — G932 Benign intracranial hypertension: Secondary | ICD-10-CM | POA: Diagnosis not present

## 2023-01-10 DIAGNOSIS — I10 Essential (primary) hypertension: Secondary | ICD-10-CM

## 2023-01-10 DIAGNOSIS — R5383 Other fatigue: Secondary | ICD-10-CM | POA: Diagnosis not present

## 2023-01-10 DIAGNOSIS — G4700 Insomnia, unspecified: Secondary | ICD-10-CM

## 2023-01-10 DIAGNOSIS — F331 Major depressive disorder, recurrent, moderate: Secondary | ICD-10-CM | POA: Diagnosis not present

## 2023-01-10 MED ORDER — TIZANIDINE HCL 4 MG PO TABS: 4.0000 mg | ORAL_TABLET | Freq: Three times a day (TID) | ORAL | 1 refills | Status: DC | PRN

## 2023-01-10 NOTE — Progress Notes (Signed)
New Patient Office Visit     CC/Reason for Visit: Establish care, discuss chronic and acute concerns Previous PCP: Unknown Last Visit: Sees GYN but unknown last contact with PCP  HPI: Tracey Beard is a 42 y.o. female who is coming in today for the above mentioned reasons. 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  She has been prescribed Maxide for hypertension but has not been taking it.  She does not smoke, she does not drink, she has had a C-section and a cholecystectomy.  She has been complaining of a lot of insomnia and fatigue.  She has not seen an eye doctor in years.  She has neck and shoulder spasms and is requesting a refill of tizanidine.  She gets her methadone at [facility].  Blood pressure is noted to be elevated today.   Past Medical/Surgical History: Past Medical History:  Diagnosis Date   Anxiety    Chronic back pain    Depression    Gestational diabetes    glyburide   Headache    Hypertension    Idiopathic intracranial hypertension    Leukocytosis 02/25/2015   Obesity    Pregnancy induced hypertension    labetalol    Past Surgical History:  Procedure Laterality Date   CESAREAN SECTION N/A 01/29/2019   Procedure: CESAREAN SECTION;  Surgeon: Mitchel Honour, DO;  Location: [facility] LD ORS;  Service: Obstetrics;  Laterality: N/A;   CHOLECYSTECTOMY     LUMBAR PUNCTURE  2014   5x    Social History:  reports that she has never smoked. She has never used smokeless tobacco. She reports current alcohol use. She reports that she does not use drugs.  Allergies: Allergies  Allergen Reactions   Elavil [Amitriptyline Hcl] Shortness Of Breath   Restoril [Temazepam] Shortness Of Breath   Trazodone And Nefazodone Anaphylaxis   Diamox  [Acetazolamide] Other (See Comments)    Cold chills/loss of taste    Tomato Swelling and Other (See Comments)    Causes lips to swell   Maxalt [Rizatriptan Benzoate] Other (See Comments)    Chest tightness and respiratory distress   Norco [Hydrocodone-Acetaminophen] Itching   Pollen Extract Other (See Comments)    Unknown reaction    Benzocaine Swelling, Rash and Other (See Comments)    Localized swelling   Latex Rash   Novocain [Procaine] Swelling, Rash and Other (See Comments)    Localized swelling    Family History:  Family History  Problem Relation Age of Onset   Hypertension Mother    Breast cancer Mother    Pancreatic cancer Mother    Diabetes Mother    Hypertension Father    Hypertension Maternal Grandmother    Heart disease Maternal Grandmother    Hypertension Maternal Grandfather    Diabetes Maternal Grandfather    Colon cancer Paternal Grandmother    Heart disease Paternal Grandfather    Hypertension Maternal Aunt    Breast cancer Maternal Aunt    Diabetes Maternal Aunt    Hypertension Maternal Uncle    Liver cancer Maternal Uncle    Prostate cancer Maternal Uncle    Miscarriages / [place] Daughter      Current Outpatient Medications:    esomeprazole (NEXIUM) 40 MG capsule, Take 1 capsule (40 mg total) by mouth 2 (two) times daily before a meal., Disp:  60 capsule, Rfl: 5   ibuprofen (ADVIL) 800 MG tablet, Take 1 tablet (800 mg total) by mouth every 6 (six) hours as needed for moderate pain., Disp: 30 tablet, Rfl: 0   ondansetron (ZOFRAN) 4 MG tablet, Take 1 tablet (4 mg total) by mouth every 6 (six) hours., Disp: 12 tablet, Rfl: 0   triamterene-hydrochlorothiazide (MAXZIDE) 75-50 MG tablet, Take 1 tablet by mouth daily., Disp: , Rfl:    tiZANidine (ZANAFLEX) 4 MG tablet, Take 1 tablet (4 mg total) by mouth 3 (three) times daily as needed for muscle spasms., Disp: 30 tablet, Rfl: 1  Review of Systems:  Negative except as indicated in HPI.   Physical  Exam: Vitals:   01/10/23 1338 01/10/23 1343  BP: (!) 140/100 (!) 160/102  Pulse: (!) 105   Temp: 99.9 F (37.7 C)   TempSrc: Oral   SpO2: 98%   Weight: 233 lb 12.8 oz (106.1 kg)   Height:  (1.549 m)    Body mass index is 44.18 kg/m.  Physical Exam Vitals reviewed.  Constitutional:      Appearance: Normal appearance.  HENT:     Head: Normocephalic and atraumatic.  Eyes:     Conjunctiva/sclera: Conjunctivae normal.     Pupils: Pupils are equal, round, and reactive to light.  Cardiovascular:     Rate and Rhythm: Normal rate and regular rhythm.  Pulmonary:     Effort: Pulmonary effort is normal.     Breath sounds: Normal breath sounds.  Skin:    General: Skin is warm and dry.  Neurological:     General: No focal deficit present.     Mental Status: She is alert and oriented to person, place, and time.  Psychiatric:        Mood and Affect: Mood normal.        Behavior: Behavior normal.        Thought Content: Thought content normal.        Judgment: Judgment normal.     Flowsheet Row Office Visit from 01/10/2023 in [facility] [place]  PHQ-9 Total Score 18        Impression and Plan:  Anxiety state  Moderate episode of recurrent major depressive disorder  Primary hypertension - Plan: CBC with Differential/Platelet, Comprehensive metabolic panel  Pseudotumor cerebri - Plan: Ambulatory referral to Ophthalmology  Insomnia, unspecified type - Plan: Ambulatory referral to Neurology  Fatigue, unspecified type - Plan: Magnesium, TSH, Vitamin B12, VITAMIN D 25 Hydroxy (Vit-D Deficiency, Fractures), tiZANidine (ZANAFLEX) 4 MG tablet  IGT (impaired glucose tolerance) - Plan: Hemoglobin A1c, Lipid panel  -Blood pressure is uncontrolled, she will resume taking Maxide and return in 6 weeks for follow-up. -She has a history of idiopathic intracranial hypertension/pseudotumor cerebri and papilledema.  She is having vision issues again.  I will  place an urgent referral to ophthalmology.  She states she has had an allergy to Diamox in the past. -She is having a lot of fatigue, I will check TSH and vitamin B12 amongst other labs. -She has known impaired glucose tolerance, check A1c. -I think it is also important to send her for sleep study given her insomnia before we consider other habit-forming medication.   Time spent: 47 minutes reviewing chart, interviewing and examining patient and formulating plan of care.        Chaya Jan, MD [facility]

## 2023-01-11 LAB — CBC WITH DIFFERENTIAL/PLATELET
Basophils Absolute: 0.1 10*3/uL (ref 0.0–0.1)
Basophils Relative: 0.7 % (ref 0.0–3.0)
Eosinophils Absolute: 0.1 10*3/uL (ref 0.0–0.7)
Eosinophils Relative: 0.6 % (ref 0.0–5.0)
HCT: 43.2 % (ref 36.0–46.0)
Hemoglobin: 14.7 g/dL (ref 12.0–15.0)
Lymphocytes Relative: 36.6 % (ref 12.0–46.0)
Lymphs Abs: 3.8 10*3/uL (ref 0.7–4.0)
MCHC: 34.1 g/dL (ref 30.0–36.0)
MCV: 86.8 fl (ref 78.0–100.0)
Monocytes Absolute: 0.6 10*3/uL (ref 0.1–1.0)
Monocytes Relative: 5.4 % (ref 3.0–12.0)
Neutro Abs: 5.8 10*3/uL (ref 1.4–7.7)
Neutrophils Relative %: 56.7 % (ref 43.0–77.0)
Platelets: 253 10*3/uL (ref 150.0–400.0)
RBC: 4.98 Mil/uL (ref 3.87–5.11)
RDW: 13.9 % (ref 11.5–15.5)
WBC: 10.3 10*3/uL (ref 4.0–10.5)

## 2023-01-11 LAB — LIPID PANEL
Cholesterol: 181 mg/dL (ref 0–200)
HDL: 69.5 mg/dL (ref >39.00)
LDL Cholesterol: 84 mg/dL (ref 0–99)
NonHDL: 111.38
Total CHOL/HDL Ratio: 3
Triglycerides: 139 mg/dL (ref 0.0–149.0)
VLDL: 27.8 mg/dL (ref 0.0–40.0)

## 2023-01-11 LAB — COMPREHENSIVE METABOLIC PANEL
ALT: 83 U/L — ABNORMAL HIGH (ref 0–35)
AST: 62 U/L — ABNORMAL HIGH (ref 0–37)
Albumin: 4.7 g/dL (ref 3.5–5.2)
Alkaline Phosphatase: 111 U/L (ref 39–117)
BUN: 8 mg/dL (ref 6–23)
CO2: 24 mEq/L (ref 19–32)
Calcium: 9.8 mg/dL (ref 8.4–10.5)
Chloride: 98 mEq/L (ref 96–112)
Creatinine, Ser: 0.76 mg/dL (ref 0.40–1.20)
GFR: 98.45 mL/min (ref >60.00)
Glucose, Bld: 260 mg/dL — ABNORMAL HIGH (ref 70–99)
Potassium: 3.9 mEq/L (ref 3.5–5.1)
Sodium: 134 mEq/L — ABNORMAL LOW (ref 135–145)
Total Bilirubin: 0.5 mg/dL (ref 0.2–1.2)
Total Protein: 8.4 g/dL — ABNORMAL HIGH (ref 6.0–8.3)

## 2023-01-11 LAB — TSH: TSH: 1.28 u[IU]/mL (ref 0.35–5.50)

## 2023-01-11 LAB — HEMOGLOBIN A1C: Hgb A1c MFr Bld: 9.8 % — ABNORMAL HIGH (ref 4.6–6.5)

## 2023-01-11 LAB — VITAMIN D 25 HYDROXY (VIT D DEFICIENCY, FRACTURES): VITD: 20.34 ng/mL — ABNORMAL LOW (ref 30.00–100.00)

## 2023-01-11 LAB — MAGNESIUM: Magnesium: 1.7 mg/dL (ref 1.5–2.5)

## 2023-01-11 LAB — VITAMIN B12: Vitamin B-12: 590 pg/mL (ref 211–911)

## 2023-01-15 ENCOUNTER — Telehealth: Payer: Self-pay | Admitting: Internal Medicine

## 2023-01-15 ENCOUNTER — Other Ambulatory Visit: Payer: Self-pay | Admitting: Internal Medicine

## 2023-01-15 ENCOUNTER — Encounter: Payer: Self-pay | Admitting: Internal Medicine

## 2023-01-15 DIAGNOSIS — E119 Type 2 diabetes mellitus without complications: Secondary | ICD-10-CM | POA: Insufficient documentation

## 2023-01-15 DIAGNOSIS — E559 Vitamin D deficiency, unspecified: Secondary | ICD-10-CM

## 2023-01-15 DIAGNOSIS — E1169 Type 2 diabetes mellitus with other specified complication: Secondary | ICD-10-CM | POA: Insufficient documentation

## 2023-01-15 DIAGNOSIS — R350 Frequency of micturition: Secondary | ICD-10-CM

## 2023-01-15 DIAGNOSIS — R7401 Elevation of levels of liver transaminase levels: Secondary | ICD-10-CM

## 2023-01-15 MED ORDER — METFORMIN HCL 500 MG PO TABS
500.0000 mg | ORAL_TABLET | Freq: Two times a day (BID) | ORAL | 1 refills | Status: DC
Start: 2023-01-15 — End: 2023-01-15

## 2023-01-15 MED ORDER — ATORVASTATIN CALCIUM 20 MG PO TABS
20.0000 mg | ORAL_TABLET | Freq: Every day | ORAL | 1 refills | Status: DC
Start: 2023-01-15 — End: 2023-04-13

## 2023-01-15 MED ORDER — VITAMIN D (ERGOCALCIFEROL) 1.25 MG (50000 UNIT) PO CAPS: 50000.0000 [IU] | ORAL_CAPSULE | ORAL | 0 refills | Status: DC

## 2023-01-15 MED ORDER — ATORVASTATIN CALCIUM 20 MG PO TABS
20.0000 mg | ORAL_TABLET | Freq: Every day | ORAL | 1 refills | Status: DC
Start: 2023-01-15 — End: 2023-01-15

## 2023-01-15 MED ORDER — METFORMIN HCL 500 MG PO TABS
500.0000 mg | ORAL_TABLET | Freq: Two times a day (BID) | ORAL | 1 refills | Status: DC
Start: 2023-01-15 — End: 2023-01-24

## 2023-01-15 NOTE — Telephone Encounter (Signed)
Patient is aware and Rx sent. Patient states that she may have a UTI.  A virtual visit was scheduled.  The patient will come by and give a urine sample. Patient would like to try Ozempic if possible?

## 2023-01-15 NOTE — Telephone Encounter (Signed)
Pt called to inform MD that her prescriptions were sent to the wrong pharmacy.  atorvastatin metFORMIN (GLUCOPHAGE) 500 MG tablet (Pt states the last time she used this medication, her stomach was really upset and so she is wondering if the dosage should be lowered?)  Pt's Rxs should have been sent to   [facility] [place], [place] - [address] Phone: [phone]  Fax: [phone]     Also, Pt is asking if MD will send her Rx for: Vitamin D

## 2023-01-16 NOTE — Telephone Encounter (Signed)
Attempted to call the patient, but unable to leave a message 

## 2023-01-17 ENCOUNTER — Telehealth (INDEPENDENT_AMBULATORY_CARE_PROVIDER_SITE_OTHER): Payer: No Typology Code available for payment source | Admitting: Internal Medicine

## 2023-01-17 ENCOUNTER — Encounter: Payer: Self-pay | Admitting: Internal Medicine

## 2023-01-17 VITALS — Wt 240.0 lb

## 2023-01-17 DIAGNOSIS — R350 Frequency of micturition: Secondary | ICD-10-CM

## 2023-01-17 DIAGNOSIS — E1169 Type 2 diabetes mellitus with other specified complication: Secondary | ICD-10-CM | POA: Diagnosis not present

## 2023-01-17 NOTE — Telephone Encounter (Signed)
Patient is aware.  Dr Ardyth Harps discussed with patient at [facility] 01/17/23.

## 2023-01-17 NOTE — Progress Notes (Signed)
Virtual Visit via Video Note  I connected with Tracey Beard on 01/17/23 at  7:00 AM EDT by a video enabled telemedicine application and verified that I am speaking with the correct person using two identifiers.  Location patient: home Location provider: work office Persons participating in the virtual visit: patient, provider  I discussed the limitations of evaluation and management by telemedicine and the availability of in person appointments. The patient expressed understanding and agreed to proceed.   HPI: She has been experiencing increased urinary frequency and urgency, no dysuria, no suprapubic pain.  She was just diagnosed with new onset diabetes 2 days ago with an A1c above 9.  She was prescribed metformin which she has not yet started.   ROS: Negative unless indicated in HPI.  Past Medical History:  Diagnosis Date   Anxiety    Chronic back pain    Depression    Gestational diabetes    glyburide   Headache    Hypertension    Idiopathic intracranial hypertension    Leukocytosis 02/25/2015   Obesity    Pregnancy induced hypertension    labetalol    Past Surgical History:  Procedure Laterality Date   CESAREAN SECTION N/A 01/29/2019   Procedure: CESAREAN SECTION;  Surgeon: Mitchel Honour, DO;  Location: [facility] LD [facility]S;  Service: Obstetrics;  Laterality: N/A;   CHOLECYSTECTOMY     LUMBAR PUNCTURE  2014   5x    Family History  Problem Relation Age of Onset   Hypertension Mother    Breast cancer Mother    Pancreatic cancer Mother    Diabetes Mother    Hypertension Father    Hypertension Maternal Grandmother    Heart disease Maternal Grandmother    Hypertension Maternal Grandfather    Diabetes Maternal Grandfather    Colon cancer Paternal Grandmother    Heart disease Paternal Grandfather    Hypertension Maternal Aunt    Breast cancer Maternal Aunt    Diabetes Maternal Aunt    Hypertension Maternal Uncle    Liver cancer Maternal Uncle    Prostate cancer  Maternal Uncle    Miscarriages / Stillbirths Daughter     SOCIAL HX:   reports that she has never smoked. She has never used smokeless tobacco. She reports current alcohol use. She reports that she does not use drugs.   Current Outpatient Medications:    atorvastatin (LIPITOR) 20 MG tablet, Take 1 tablet (20 mg total) by mouth daily., Disp: 90 tablet, Rfl: 1   esomeprazole (NEXIUM) 40 MG capsule, Take 1 capsule (40 mg total) by mouth 2 (two) times daily before a meal., Disp: 60 capsule, Rfl: 5   ibuprofen (ADVIL) 800 MG tablet, Take 1 tablet (800 mg total) by mouth every 6 (six) hours as needed for moderate pain., Disp: 30 tablet, Rfl: 0   metFORMIN (GLUCOPHAGE) 500 MG tablet, Take 1 tablet (500 mg total) by mouth 2 (two) times daily with a meal., Disp: 180 tablet, Rfl: 1   ondansetron (ZOFRAN) 4 MG tablet, Take 1 tablet (4 mg total) by mouth every 6 (six) hours., Disp: 12 tablet, Rfl: 0   tiZANidine (ZANAFLEX) 4 MG tablet, Take 1 tablet (4 mg total) by mouth 3 (three) times daily as needed for muscle spasms., Disp: 30 tablet, Rfl: 1   triamterene-hydrochlorothiazide (MAXZIDE) 75-50 MG tablet, Take 1 tablet by mouth daily., Disp: , Rfl:    Vitamin D, Ergocalciferol, (DRISDOL) 1.25 MG (50000 UNIT) CAPS capsule, Take 1 capsule (50,000 Units total) by  mouth every 7 (seven) days., Disp: 12 capsule, Rfl: 0  EXAM:   VITALS per patient if applicable: None reported  GENERAL: alert, oriented, appears well and in no acute distress  HEENT: atraumatic, conjunttiva clear, no obvious abnormalities on inspection of external nose and ears  NECK: normal movements of the head and neck  LUNGS: on inspection no signs of respiratory distress, breathing rate appears normal, no obvious gross increased work of breathing, gasping or wheezing  CV: no obvious cyanosis  MS: moves all visible extremities without noticeable abnormality  PSYCH/NEURO: pleasant and cooperative, no obvious depression or anxiety,  speech and thought processing grossly intact  ASSESSMENT AND PLAN:   Urine frequency - Plan: Urinalysis with Reflex Microscopic, Urine Culture  Type 2 diabetes mellitus with other specified complication, without long-term current use of insulin (HCC) - Plan: Urine microalbumin-creatinine with uACR  -I think her urinary frequency is likely related to glucosuria from her newly diagnosed diabetes, absence of dysuria makes a UTI less likely; nonetheless we will await urine sample for further analysis.   I discussed the assessment and treatment plan with the patient. The patient was provided an opportunity to ask questions and all were answered. The patient agreed with the plan and demonstrated an understanding of the instructions.   The patient was advised to call back or seek an in-person evaluation if the symptoms worsen or if the condition fails to improve as anticipated.    Chaya Jan, MD  [facility] at [facility]

## 2023-01-18 ENCOUNTER — Telehealth: Payer: Self-pay | Admitting: Internal Medicine

## 2023-01-18 ENCOUNTER — Encounter: Payer: Self-pay | Admitting: Internal Medicine

## 2023-01-18 NOTE — Telephone Encounter (Signed)
Forwarded to [employer].

## 2023-01-18 NOTE — Telephone Encounter (Signed)
An OPHTHALMOLOGY Referral was created for Pt on 01/10/23.  [facility] called to inform MD that they are not In Network with [employer].

## 2023-01-18 NOTE — Telephone Encounter (Signed)
Patient is aware and will call her insurance company.

## 2023-01-19 ENCOUNTER — Other Ambulatory Visit: Payer: Self-pay | Admitting: Internal Medicine

## 2023-01-19 DIAGNOSIS — R5383 Other fatigue: Secondary | ICD-10-CM

## 2023-01-24 ENCOUNTER — Telehealth: Payer: Self-pay | Admitting: *Deleted

## 2023-01-24 ENCOUNTER — Other Ambulatory Visit: Payer: Self-pay | Admitting: Internal Medicine

## 2023-01-24 ENCOUNTER — Encounter: Payer: Self-pay | Admitting: Internal Medicine

## 2023-01-24 DIAGNOSIS — E1169 Type 2 diabetes mellitus with other specified complication: Secondary | ICD-10-CM

## 2023-01-24 LAB — HM DIABETES EYE EXAM

## 2023-01-24 MED ORDER — OZEMPIC (0.25 OR 0.5 MG/DOSE) 2 MG/3ML ~~LOC~~ SOPN
0.5000 mg | PEN_INJECTOR | SUBCUTANEOUS | 0 refills | Status: DC
Start: 2023-01-24 — End: 2023-02-18

## 2023-01-24 NOTE — Telephone Encounter (Signed)
Prior Tracey Beard has been started for  Semaglutide,0.25 or 0.5MG /DOS, (OZEMPIC, 0.25 OR 0.5 MG/DOSE,) 2 MG/3ML SOPN  Key: [id number]

## 2023-01-24 NOTE — Telephone Encounter (Signed)
"  Your information has been submitted to [employer]"

## 2023-01-25 NOTE — Telephone Encounter (Signed)
This request has received an approval.  Patient is aware via MyChart.

## 2023-01-28 ENCOUNTER — Encounter: Payer: Self-pay | Admitting: Internal Medicine

## 2023-01-29 ENCOUNTER — Other Ambulatory Visit: Payer: Self-pay | Admitting: Internal Medicine

## 2023-01-29 DIAGNOSIS — R5383 Other fatigue: Secondary | ICD-10-CM

## 2023-01-29 NOTE — Telephone Encounter (Signed)
Pt called to FU on this refill request, stating that she will be going out of town tomorrow and really needs to take it with her.

## 2023-02-01 ENCOUNTER — Encounter: Payer: Self-pay | Admitting: Internal Medicine

## 2023-02-06 ENCOUNTER — Encounter: Payer: Self-pay | Admitting: Neurology

## 2023-02-06 ENCOUNTER — Ambulatory Visit: Payer: No Typology Code available for payment source | Admitting: Neurology

## 2023-02-06 ENCOUNTER — Other Ambulatory Visit: Payer: Self-pay | Admitting: Internal Medicine

## 2023-02-06 VITALS — BP 144/87 | HR 117 | Ht 63.0 in | Wt 216.0 lb

## 2023-02-06 DIAGNOSIS — R5383 Other fatigue: Secondary | ICD-10-CM

## 2023-02-06 DIAGNOSIS — F5104 Psychophysiologic insomnia: Secondary | ICD-10-CM | POA: Diagnosis not present

## 2023-02-06 DIAGNOSIS — Z711 Person with feared health complaint in whom no diagnosis is made: Secondary | ICD-10-CM | POA: Diagnosis not present

## 2023-02-06 DIAGNOSIS — F419 Anxiety disorder, unspecified: Secondary | ICD-10-CM | POA: Diagnosis not present

## 2023-02-06 DIAGNOSIS — F32A Depression, unspecified: Secondary | ICD-10-CM | POA: Insufficient documentation

## 2023-02-06 MED ORDER — BUSPIRONE HCL 5 MG PO TABS: 5.0000 mg | ORAL_TABLET | Freq: Three times a day (TID) | ORAL | 0 refills | Status: DC

## 2023-02-06 NOTE — Addendum Note (Signed)
Addended by: Melvyn Novas on: 02/06/2023 12:28 PM   Modules accepted: Orders

## 2023-02-06 NOTE — Progress Notes (Addendum)
SLEEP MEDICINE CLINIC    Provider:  Melvyn Novas, MD  Primary Care Physician:  Tracey Beard, Tracey Patricia, MD [address] [place] [address]     Referring Provider: Philip Beard, Tracey Patricia, Md [address] [place],  [place] [address]          Chief Complaint according to patient   Patient presents with:     New Patient (Initial Visit)           HISTORY OF PRESENT ILLNESS:  Tracey Beard is a 42 y.o. female patient who is seen upon referral on 02/06/2023 from Dr Ardyth HarpsLoreta Ave, MD. for a sleep evaluation. She was just diagnosed with DM2, recent eye exam with dr Dione Booze. was normal.  Chief concern according to patient :  " chronic insomnia for years, affecting my mental health"     Tracey Beard was seen on 02/06/23, she is the mother of a 53 year old son and has tried all kinds of  insomnia OTC aids. She carries a diagnosis of depression and anxiety and is remaining untreated. dx with PTSD, 2018 , but no follow up.    She  had a paradoxical response to melatonin, and is allergic to trazodone. She gets cluster migraines. She was on Topiramate and Maxalt - both gave her chest tightness. Anxiety. She is taking tizanidine. Failed zoloft and cymbalta, has not been seen by mental health-   The patient is here to be evaluated for an organic sleep disorder. She had a sleep study in [place] , she didn't sleep enough to be valid.    Sleep relevant medical history: insomnia since 2012-2014 . Was hallucinating after not sleeping at all for 3 days,  urgent care gave acute relief: benzodiazepine.  Sleeping not all for some nights, often  not sleeping before son is ready to go to school. Then she may sleep a couple of hours, chronic pain.  PTSD - daytime flash backs, vivid dreams, racing mind , wisdom teeth extraction. Bruxism. Was a third shift worker.    Tracey Beard. 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  She has been prescribed Maxide for hypertension but has not been taking it.  She does not smoke, she does not drink, she has had a C-section and a cholecystectomy.  She has been complaining of a lot of insomnia and fatigue.  She has not seen an eye doctor in years.  She has neck and shoulder spasms and is requesting a refill of tizanidine.  She gets her [facility]adone at [facility].  Blood pressure is noted to be elevated today.     Family medical /sleep history: NO other family member on CPAP with OSA, with insomnia, with sleep walking.    Social history:  Patient is working as Public house manager, currently out on medical leave- she has been a third Education officer, museum for years.   She lives in a household with husband and son. Husband is concerned about her tension and mental health.  The patient  used to work in shifts( night/ rotating,) Tobacco use; none.  ETOH use ; none,  Caffeine intake in form of Coffee( 1 cup every other day) Soda( /) Tea ( /) or energy drinks.        Sleep habits are as follows: The patient's dinner time is  between 6 PM. The patient goes to bed at 9.30 PM  ( goes with son to bed )and continues to be awake until the morning hours,  Falling asleep after 4-5  AM.  Wakes up at 7 .   The preferred sleep position is laterally , with the support of 3 pillows.  Dreams are reportedly frequent/vivid/ scary/.   The patient wakes up spontaneously/ 6.30  AM is the usual rise time. She reports not feeling refreshed or restored in AM, with symptoms such as dry mouth, morning headaches, and residual fatigue.  Headaches often wake her out of sleep, too. Naps are not taken.       Review of Systems: Out of a complete 14 system review, the patient complains of only the following symptoms, and all other reviewed systems are  negative.:  Fatigue, sleepiness , snoring, fragmented sleep, over a decade of Insomnia, PTSD- still born daughter, mother and grandmother passed away over a few months.     How likely are you to doze in the following situations: 0 = not likely, 1 = slight chance, 2 = moderate chance, 3 = high chance   Sitting and Reading? Watching Television? Sitting inactive in a public place (theater or meeting)? As a passenger in a car for an hour without a break? Lying down in the afternoon when circumstances permit? Sitting and talking to someone? Sitting quietly after lunch without alcohol? In a car, while stopped for a few minutes in traffic?   Total = 3/ 24 points   FSS endorsed at 63/ 63 points.   Social History   Socioeconomic History   Marital status: Married    Spouse name: Not on file   Number of children: 2   Years of education: Not on file   Highest education level: Associate degree: occupational, Scientist, product/process development, or vocational program  Occupational History   Occupation: Nurse  Tobacco Use   Smoking status: Never   Smokeless tobacco: Never  Vaping Use   Vaping Use: Never used  Substance and Sexual Activity   Alcohol use: Yes    Comment: socially   Drug use: No   Sexual activity: Not Currently    Birth control/protection: None  Other Topics Concern   Not on file  Social History Narrative   Not on file   Social Determinants of Health   Financial Resource Strain: Low Risk  (01/21/2019)   Overall Financial Resource Strain (CARDIA)    Difficulty of Paying Living Expenses: Not hard at all  Food Insecurity: No Food Insecurity (01/21/2019)   Hunger Vital Sign    Worried About Running Out of Food in the Last Year: Never true    Ran Out of Food in the Last Year: Never true  Transportation Needs: No Transportation Needs (08/26/2020)   PRAPARE - Administrator, Civil Service (Medical): No    Lack of Transportation (Non-Medical): No  Physical Activity: Not on file  Stress:  Stress Concern Present (01/21/2019)   [employer] of Occupational Health - Occupational Stress Questionnaire    Feeling of Stress : To some extent  Social Connections: Not on file    Family History  Problem Relation Age of Onset   Hypertension Mother    Breast cancer Mother    Pancreatic cancer Mother    Diabetes Mother    Hypertension Father    Hypertension Maternal Grandmother    Heart disease Maternal Grandmother    Hypertension Maternal Grandfather    Diabetes Maternal Grandfather  Colon cancer Paternal Grandmother    Heart disease Paternal Grandfather    Hypertension Maternal Aunt    Breast cancer Maternal Aunt    Diabetes Maternal Aunt    Hypertension Maternal Uncle    Liver cancer Maternal Uncle    Prostate cancer Maternal Uncle    Miscarriages / Stillbirths Daughter     Past Medical History:  Diagnosis Date   Anxiety    Chronic back pain    Depression    Gestational diabetes    glyburide   Headache    Hypertension    Idiopathic intracranial hypertension    Leukocytosis 02/25/2015   Obesity    Pregnancy induced hypertension    labetalol    Past Surgical History:  Procedure Laterality Date   CESAREAN SECTION N/A 01/29/2019   Procedure: CESAREAN SECTION;  Surgeon: Mitchel Honour, DO;  Location: [facility] LD ORS;  Service: Obstetrics;  Laterality: N/A;   CHOLECYSTECTOMY     LUMBAR PUNCTURE  2014   5x     Current Outpatient Medications on File Prior to Visit  Medication Sig Dispense Refill   atorvastatin (LIPITOR) 20 MG tablet Take 1 tablet (20 mg total) by mouth daily. 90 tablet 1   esomeprazole (NEXIUM) 40 MG capsule Take 1 capsule (40 mg total) by mouth 2 (two) times daily before a meal. 60 capsule 5   ibuprofen (ADVIL) 800 MG tablet Take 1 tablet (800 mg total) by mouth every 6 (six) hours as needed for moderate pain. 30 tablet 0   ondansetron (ZOFRAN) 4 MG tablet Take 1 tablet (4 mg total) by mouth every 6 (six) hours. 12 tablet 0   Semaglutide,0.25 or  0.5MG /DOS, (OZEMPIC, 0.25 OR 0.5 MG/DOSE,) 2 MG/3ML SOPN Inject 0.5 mg into the skin once a week. 3 mL 0   triamterene-hydrochlorothiazide (MAXZIDE) 75-50 MG tablet Take 1 tablet by mouth daily.     Vitamin D, Ergocalciferol, (DRISDOL) 1.25 MG (50000 UNIT) CAPS capsule Take 1 capsule (50,000 Units total) by mouth every 7 (seven) days. 12 capsule 0   No current facility-administered medications on file prior to visit.    Allergies  Allergen Reactions   Elavil [Amitriptyline Hcl] Shortness Of Breath   Restoril [Temazepam] Shortness Of Breath   Trazodone And Nefazodone Anaphylaxis   Diamox [Acetazolamide] Other (See Comments)    Cold chills/loss of taste    Tomato Swelling and Other (See Comments)    Causes lips to swell   Maxalt [Rizatriptan Benzoate] Other (See Comments)    Chest tightness and respiratory distress   Norco [Hydrocodone-Acetaminophen] Itching   Pollen Extract Other (See Comments)    Unknown reaction    Benzocaine Swelling, Rash and Other (See Comments)    Localized swelling   Latex Rash   Novocain [Procaine] Swelling, Rash and Other (See Comments)    Localized swelling     DIAGNOSTIC DATA (LABS, IMAGING, TESTING) - I reviewed patient records, labs, notes, testing and imaging myself where available.  Lab Results  Component Value Date   WBC 10.3 01/10/2023   HGB 14.7 01/10/2023   HCT 43.2 01/10/2023   MCV 86.8 01/10/2023   PLT 253.0 01/10/2023      Component Value Date/Time   NA 134 (L) 01/10/2023 1414   K 3.9 01/10/2023 1414   CL 98 01/10/2023 1414   CO2 24 01/10/2023 1414   GLUCOSE 260 (H) 01/10/2023 1414   BUN 8 01/10/2023 1414   CREATININE 0.76 01/10/2023 1414   CALCIUM 9.8 01/10/2023 1414   PROT  8.4 (H) 01/10/2023 1414   ALBUMIN 4.7 01/10/2023 1414   AST 62 (H) 01/10/2023 1414   ALT 83 (H) 01/10/2023 1414   ALKPHOS 111 01/10/2023 1414   BILITOT 0.5 01/10/2023 1414   GFRNONAA >60 05/16/2022 1018   GFRAA >60 04/30/2020 2046   Lab Results   Component Value Date   CHOL 181 01/10/2023   HDL 69.50 01/10/2023   LDLCALC 84 01/10/2023   TRIG 139.0 01/10/2023   CHOLHDL 3 01/10/2023   Lab Results  Component Value Date   HGBA1C 9.8 (H) 01/10/2023   Lab Results  Component Value Date   VITAMINB12 590 01/10/2023   Lab Results  Component Value Date   TSH 1.28 01/10/2023    PHYSICAL EXAM:  Today's Vitals   02/06/23 1103  BP: (!) 144/87  Pulse: (!) 117  Weight: 216 lb (98 kg)  Height: 5\' 3"  (1.6 m)   Body mass index is 38.26 kg/m.   Wt Readings from Last 3 Encounters:  02/06/23 216 lb (98 kg)  01/17/23 240 lb (108.9 kg)  01/10/23 233 lb 12.8 oz (106.1 kg)     Ht Readings from Last 3 Encounters:  02/06/23 5\' 3"  (1.6 m)  01/10/23 5\' 1"  (1.549 m)  05/22/22 5\' 1"  (1.549 m)      General: The patient is awake, alert and appears distressed. The patient is well groomed. Head: Normocephalic, atraumatic. Neck is supple. Mallampati 3,  neck circumference:14 inches . Nasal airflow congested .  Retrognathia is not seen.  Dental status:  biological  Cardiovascular:  Regular rate and cardiac rhythm by pulse,  without distended neck veins. Respiratory: Lungs are clear to auscultation.  Skin:  Without evidence of ankle edema, or rash. Trunk: The patient's posture is erect.   NEUROLOGIC EXAM: The patient is awake and alert, oriented to place and time.   Memory subjective described as intact.  Attention span & concentration ability appears normal.  Speech is fluent,  without  dysarthria, dysphonia or aphasia.  Mood and affect are appropriate.   Cranial nerves: no loss of smell or taste reported  Pupils are equal and briskly reactive to light. Funduscopic exam deferred.  Extraocular movements in vertical and horizontal planes were intact and without nystagmus. No Diplopia. Visual fields by finger perimetry are intact. Hearing was intact to soft voice and finger rubbing.    Facial sensation intact to fine touch.  Facial  motor strength is symmetric and tongue was midline.  Neck ROM : rotation, tilt and flexion extension were normal for age and shoulder shrug was symmetrical.    Motor exam:  Symmetric bulk, tone and ROM.   Normal tone without cog -wheeling, symmetric grip strength .   Sensory:  Fine touch, vibration were intact  Proprioception tested in the upper extremities was normal.   Coordination: Rapid alternating movements in the fingers/hands were of normal speed.  The Finger-to-nose maneuver was intact without evidence of ataxia, dysmetria or tremor.   Gait and station: Patient could rise unassisted from a seated position, walked without assistive device.  Stance is of normal width/ base turned with 3 steps ( observation) .  Toe and heel walk were deferred.  Deep tendon reflexes: in the  upper and lower extremities are symmetric and intact.  Babinski response was deferred     ASSESSMENT AND PLAN 42 y.o. year old female  here with:    1)  chronic insomnia ( sleep initiation ) due to anxiety and depression and PTSD .  Please refer to behavioral health.   2)  uncontrolled Diabetes mellitus , new onset. She had gestational diabetes 5 years ago, just started on    ozempic. Now following glucose measured at home. Nocturia is likely related and will imprve with DM control.    3) rare snoring. High fatigue not sleepy.  Sometimes unable to sleep for more than 48 hours (!)  Allergic to multiple medications. Exhaustion from  sleep deprivation.    I do not see a way to obtain a valid sleep test with the symptoms as described and will refer to behavioral health for anxiety and depression/ PTSD treatment. Mind is racing, she is hypervigilant and she is not taking care of herself at this time. Overwhelmed by anxiety/ panic, physical symptoms ,somatization.    I plan to follow up once depression / anxiety and PTSD  care are established. PRN .  I gave her 30 days of buspar; no refills and this is a one  time bridge -over   I would like to thank Tracey Beard, Tracey Patricia, MD and Tracey Beard, Tracey Patricia, Md [address] [place],  [place] [address] for allowing me to meet with and to take care of this pleasant patient.    After spending a total time of  40  minutes face to face and additional time for physical and neurologic examination, review of laboratory studies,  personal review of imaging studies, reports and results of other testing and review of referral information / records as far as provided in visit,   Electronically signed by: Tracey Novas, MD 02/06/2023 11:51 AM  [facility] Board certified by The [employer] of Sleep Medicine and Diplomate of the [employer] of Sleep Medicine. Board certified In Neurology through the ABPN, Fellow of the [employer] of Neurology. Medical Director of [facility].

## 2023-02-06 NOTE — Patient Instructions (Addendum)
Buspirone Tablets What is this medication? BUSPIRONE (byoo SPYE rone) treats anxiety. It works by balancing the levels of dopamine and serotonin in your brain, substances that help regulate mood. This medicine may be used for other purposes; ask your health care provider or pharmacist if you have questions. COMMON BRAND NAME(S): BuSpar, Buspar Dividose What should I tell my care team before I take this medication? They need to know if you have any of these conditions: Kidney or liver disease An unusual or allergic reaction to buspirone, other medications, foods, dyes, or preservatives Pregnant or trying to get pregnant Breast-feeding How should I use this medication? Take this medication by mouth with a glass of water. Follow the directions on the prescription label. You may take this medication with or without food. To ensure that this medication always works the same way for you, you should take it either always with or always without food. Take your doses at regular intervals. Do not take your medication more often than directed. Do not stop taking except on the advice of your care team. Talk to your care team about the use of this medication in children. Special care may be needed. Overdosage: If you think you have taken too much of this medicine contact a poison control center or emergency room at once. NOTE: This medicine is only for you. Do not share this medicine with others. What if I miss a dose? If you miss a dose, take it as soon as you can. If it is almost time for your next dose, take only that dose. Do not take double or extra doses. What may interact with this medication? Do not take this medication with any of the following: Linezolid MAOIs like Carbex, Eldepryl, Marplan, Nardil, and Parnate Methylene blue Procarbazine This medication may also interact with the following: Diazepam Digoxin Diltiazem Erythromycin Grapefruit juice Haloperidol Medications for mental  depression or mood problems Medications for seizures like carbamazepine, phenobarbital and phenytoin Nefazodone Other medications for anxiety Rifampin Ritonavir Some antifungal medications like itraconazole, ketoconazole, and voriconazole Verapamil Warfarin This list may not describe all possible interactions. Give your health care provider a list of all the medicines, herbs, non-prescription drugs, or dietary supplements you use. Also tell them if you smoke, drink alcohol, or use illegal drugs. Some items may interact with your medicine. What should I watch for while using this medication? Visit your care team for regular checks on your progress. It may take 1 to 2 weeks before your anxiety gets better. This medication may affect your coordination, reaction time, or judgment. Do not drive or operate machinery until you know how this medication affects you. Sit up or stand slowly to reduce the risk of dizzy or fainting spells. Drinking alcohol with this medication can increase the risk of these side effects. What side effects may I notice from receiving this medication? Side effects that you should report to your care team as soon as possible: Allergic reactions--skin rash, itching, hives, swelling of the face, lips, tongue, or throat Irritability, confusion, fast or irregular heartbeat, muscle stiffness, twitching muscles, sweating, high fever, seizure, chills, vomiting, diarrhea, which may be signs of serotonin syndrome Side effects that usually do not require medical attention (report to your care team if they continue or are bothersome): Anxiety, nervousness Dizziness Drowsiness Headache Nausea Trouble sleeping This list may not describe all possible side effects. Call your doctor for medical advice about side effects. You may report side effects to FDA at 1-800-FDA-1088. Where should I keep  my medication? Keep out of the reach of children. Store at room temperature below 30 degrees C  (86 degrees F). Protect from light. Keep container tightly closed. Throw away any unused medication after the expiration date. NOTE: This sheet is a summary. It may not cover all possible information. If you have questions about this medicine, talk to your doctor, pharmacist, or health care provider.  2023 Elsevier/Gold Standard (2022-03-27 00:00:00)    Quality Sleep Information, Adult Quality sleep is important for your mental and physical health. It also improves your quality of life. Quality sleep means you: Are asleep for most of the time you are in bed. Fall asleep within 30 minutes. Wake up no more than once a night. Are awake for no longer than 20 minutes if you do wake up during the night. Most adults need 6-8 hours of quality sleep each night. How can poor sleep affect me? If you do not get enough quality sleep, you may have: Mood swings. Daytime sleepiness. Decreased alertness, reaction time, and concentration. Sleep disorders, such as insomnia and sleep apnea. Difficulty with: Solving problems. Coping with stress. Paying attention. These issues may affect your performance and productivity at work, school, and home. Lack of sleep may also put you at higher risk for accidents, suicide, and risky behaviors. If you do not get quality sleep, you may also be at higher risk for several health problems, including: Infections. Type 2 diabetes. Heart disease. High blood pressure. Obesity. Worsening of long-term conditions, like arthritis, kidney disease, depression, Parkinson's disease, and epilepsy. What actions can I take to get more quality sleep? Sleep schedule and routine Stick to a sleep schedule. Go to sleep and wake up at about the same time each day. Do not try to sleep less on weekdays and make up for lost sleep on weekends. This does not work. Limit naps during the day to 30 minutes or less. Do not take naps in the late afternoon. Make time to relax before bed.  Reading, listening to music, or taking a hot bath promotes quality sleep. Make your bedroom a place that promotes quality sleep. Keep your bedroom dark, quiet, and at a comfortable room temperature. Make sure your bed is comfortable. Avoid using electronic devices that give off bright blue light for 30 minutes before bedtime. Your brain perceives bright blue light as sunlight. This includes television, phones, and computers. If you are lying awake in bed for longer than 20 minutes, get up and do a relaxing activity until you feel sleepy. Lifestyle     Try to get at least 30 minutes of exercise on most days. Do not exercise 2-3 hours before going to bed. Do not use any products that contain nicotine or tobacco. These products include cigarettes, chewing tobacco, and vaping devices, such as e-cigarettes. If you need help quitting, ask your health care provider. Do not drink caffeinated beverages for at least 8 hours before going to bed. Coffee, tea, and some sodas contain caffeine. Do not drink alcohol or eat large meals close to bedtime. Try to get at least 30 minutes of sunlight every day. Morning sunlight is best. Medical concerns Work with your health care provider to treat medical conditions that may affect sleeping, such as: Nasal obstruction. Snoring. Sleep apnea and other sleep disorders. Talk to your health care provider if you think any of your prescription medicines may cause you to have difficulty falling or staying asleep. If you have sleep problems, talk with a sleep consultant. If you  think you have a sleep disorder, talk with your health care provider about getting evaluated by a specialist. Where to find more information Sleep Foundation: sleepfoundation.org American Academy of Sleep Medicine: aasm.org Centers for Disease Control and Prevention (CDC): TonerPromos.no Contact a health care provider if: You have trouble getting to sleep or staying asleep. You often wake up very early  in the morning and cannot get back to sleep. You have daytime sleepiness. You have daytime sleep attacks of suddenly falling asleep and sudden muscle weakness (narcolepsy). You have a tingling sensation in your legs with a strong urge to move your legs (restless legs syndrome). You stop breathing briefly during sleep (sleep apnea). You think you have a sleep disorder or are taking a medicine that is affecting your quality of sleep. Summary Most adults need 7-8 hours of quality sleep each night. Getting enough quality sleep is important for your mental and physical health. Make your bedroom a place that promotes quality sleep, and avoid things that may cause you to have poor sleep, such as alcohol, caffeine, smoking, or large meals. Talk to your health care provider if you have trouble falling asleep or staying asleep. This information is not intended to replace advice given to you by your health care provider. Make sure you discuss any questions you have with your health care provider. Document Revised: 12/28/2021 Document Reviewed: 12/28/2021 Elsevier Patient Education  2023 Elsevier Inc.    Insomnia is a sleep disorder that makes it difficult to fall asleep or stay asleep. Insomnia can cause fatigue, low energy, difficulty concentrating, mood swings, and poor performance at work or school. There are three different ways to classify insomnia: Difficulty falling asleep. Difficulty staying asleep. Waking up too early in the morning. Any type of insomnia can be long-term (chronic) or short-term (acute). Both are common. Short-term insomnia usually lasts for 3 months or less. Chronic insomnia occurs at least three times a week for longer than 3 months. What are the causes? Insomnia may be caused by another condition, situation, or substance, such as: Having certain mental health conditions, such as anxiety and depression. Using caffeine, alcohol, tobacco, or drugs. Having gastrointestinal  conditions, such as gastroesophageal reflux disease (GERD). Having certain medical conditions. These include: Asthma. Alzheimer's disease. Stroke. Chronic pain. An overactive thyroid gland (hyperthyroidism). Other sleep disorders, such as restless legs syndrome and sleep apnea. Menopause. Sometimes, the cause of insomnia may not be known. What increases the risk? Risk factors for insomnia include: Gender. Females are affected more often than males. Age. Insomnia is more common as people get older. Stress and certain medical and mental health conditions. Lack of exercise. Having an irregular work schedule. This may include working night shifts and traveling between different time zones. What are the signs or symptoms? If you have insomnia, the main symptom is having trouble falling asleep or having trouble staying asleep. This may lead to other symptoms, such as: Feeling tired or having low energy. Feeling nervous about going to sleep. Not feeling rested in the morning. Having trouble concentrating. Feeling irritable, anxious, or depressed. How is this diagnosed? This condition may be diagnosed based on: Your symptoms and medical history. Your health care provider may ask about: Your sleep habits. Any medical conditions you have. Your mental health. A physical exam. How is this treated? Treatment for insomnia depends on the cause. Treatment may focus on treating an underlying condition that is causing the insomnia. Treatment may also include: Medicines to help you sleep. Counseling or therapy.  Lifestyle adjustments to help you sleep better. Follow these instructions at home: Eating and drinking  Limit or avoid alcohol, caffeinated beverages, and products that contain nicotine and tobacco, especially close to bedtime. These can disrupt your sleep. Do not eat a large meal or eat spicy foods right before bedtime. This can lead to digestive discomfort that can make it hard for you  to sleep. Sleep habits  Keep a sleep diary to help you and your health care provider figure out what could be causing your insomnia. Write down: When you sleep. When you wake up during the night. How well you sleep and how rested you feel the next day. Any side effects of medicines you are taking. What you eat and drink. Make your bedroom a dark, comfortable place where it is easy to fall asleep. Put up shades or blackout curtains to block light from outside. Use a white noise machine to block noise. Keep the temperature cool. Limit screen use before bedtime. This includes: Not watching TV. Not using your smartphone, tablet, or computer. Stick to a routine that includes going to bed and waking up at the same times every day and night. This can help you fall asleep faster. Consider making a quiet activity, such as reading, part of your nighttime routine. Try to avoid taking naps during the day so that you sleep better at night. Get out of bed if you are still awake after 15 minutes of trying to sleep. Keep the lights down, but try reading or doing a quiet activity. When you feel sleepy, go back to bed. General instructions Take over-the-counter and prescription medicines only as told by your health care provider. Exercise regularly as told by your health care provider. However, avoid exercising in the hours right before bedtime. Use relaxation techniques to manage stress. Ask your health care provider to suggest some techniques that may work well for you. These may include: Breathing exercises. Routines to release muscle tension. Visualizing peaceful scenes. Make sure that you drive carefully. Do not drive if you feel very sleepy. Keep all follow-up visits. This is important. Contact a health care provider if: You are tired throughout the day. You have trouble in your daily routine due to sleepiness. You continue to have sleep problems, or your sleep problems get worse. Get help right  away if: You have thoughts about hurting yourself or someone else. Get help right away if you feel like you may hurt yourself or others, or have thoughts about taking your own life. Go to your nearest emergency room or: Call 911. Call the National Suicide Prevention Lifeline at (850)152-9123 or 988. This is open 24 hours a day. Text the Crisis Text Line at (438)518-1052. Summary Insomnia is a sleep disorder that makes it difficult to fall asleep or stay asleep. Insomnia can be long-term (chronic) or short-term (acute). Treatment for insomnia depends on the cause. Treatment may focus on treating an underlying condition that is causing the insomnia. Keep a sleep diary to help you and your health care provider figure out what could be causing your insomnia. This information is not intended to replace advice given to you by your health care provider. Make sure you discuss any questions you have with your health care provider. Document Revised: 08/15/2021 Document Reviewed: 08/15/2021 Elsevier Patient Education  2023 [employer].

## 2023-02-18 ENCOUNTER — Other Ambulatory Visit: Payer: Self-pay | Admitting: Internal Medicine

## 2023-02-18 DIAGNOSIS — E1169 Type 2 diabetes mellitus with other specified complication: Secondary | ICD-10-CM

## 2023-02-19 MED ORDER — TRIAMTERENE-HCTZ 75-50 MG PO TABS: 1.0000 | ORAL_TABLET | Freq: Every day | ORAL | 0 refills | Status: DC

## 2023-02-19 MED ORDER — OZEMPIC (0.25 OR 0.5 MG/DOSE) 2 MG/3ML ~~LOC~~ SOPN
0.5000 mg | PEN_INJECTOR | SUBCUTANEOUS | 0 refills | Status: DC
Start: 2023-02-19 — End: 2023-02-21

## 2023-02-20 ENCOUNTER — Ambulatory Visit: Payer: No Typology Code available for payment source | Admitting: Internal Medicine

## 2023-02-21 ENCOUNTER — Ambulatory Visit (INDEPENDENT_AMBULATORY_CARE_PROVIDER_SITE_OTHER): Payer: No Typology Code available for payment source | Admitting: Internal Medicine

## 2023-02-21 ENCOUNTER — Encounter (HOSPITAL_COMMUNITY): Payer: Self-pay | Admitting: Psychiatry

## 2023-02-21 ENCOUNTER — Encounter: Payer: Self-pay | Admitting: Internal Medicine

## 2023-02-21 ENCOUNTER — Ambulatory Visit (HOSPITAL_BASED_OUTPATIENT_CLINIC_OR_DEPARTMENT_OTHER): Payer: No Typology Code available for payment source | Admitting: Psychiatry

## 2023-02-21 ENCOUNTER — Other Ambulatory Visit: Payer: Self-pay

## 2023-02-21 VITALS — BP 137/89 | HR 76 | Ht 61.0 in | Wt 209.0 lb

## 2023-02-21 VITALS — BP 141/96 | HR 125 | Temp 98.9°F | Wt 217.6 lb

## 2023-02-21 DIAGNOSIS — B3731 Acute candidiasis of vulva and vagina: Secondary | ICD-10-CM

## 2023-02-21 DIAGNOSIS — E785 Hyperlipidemia, unspecified: Secondary | ICD-10-CM

## 2023-02-21 DIAGNOSIS — E559 Vitamin D deficiency, unspecified: Secondary | ICD-10-CM | POA: Diagnosis not present

## 2023-02-21 DIAGNOSIS — Z7984 Long term (current) use of oral hypoglycemic drugs: Secondary | ICD-10-CM

## 2023-02-21 DIAGNOSIS — I10 Essential (primary) hypertension: Secondary | ICD-10-CM | POA: Diagnosis not present

## 2023-02-21 DIAGNOSIS — F320 Major depressive disorder, single episode, mild: Secondary | ICD-10-CM | POA: Diagnosis not present

## 2023-02-21 DIAGNOSIS — F411 Generalized anxiety disorder: Secondary | ICD-10-CM

## 2023-02-21 DIAGNOSIS — E1169 Type 2 diabetes mellitus with other specified complication: Secondary | ICD-10-CM

## 2023-02-21 DIAGNOSIS — R5383 Other fatigue: Secondary | ICD-10-CM | POA: Diagnosis not present

## 2023-02-21 DIAGNOSIS — R7401 Elevation of levels of liver transaminase levels: Secondary | ICD-10-CM

## 2023-02-21 MED ORDER — TEMAZEPAM 15 MG PO CAPS: ORAL_CAPSULE | ORAL | 4 refills | Status: DC

## 2023-02-21 MED ORDER — FREESTYLE LIBRE 3 SENSOR MISC
1.0000 | 3 refills | Status: AC
Start: 2023-02-21 — End: ?

## 2023-02-21 MED ORDER — FLUCONAZOLE 150 MG PO TABS
150.0000 mg | ORAL_TABLET | Freq: Once | ORAL | 0 refills | Status: AC
Start: 2023-02-21 — End: 2023-02-21

## 2023-02-21 MED ORDER — SEMAGLUTIDE (1 MG/DOSE) 4 MG/3ML ~~LOC~~ SOPN
1.0000 mg | PEN_INJECTOR | SUBCUTANEOUS | 2 refills | Status: DC
Start: 2023-02-21 — End: 2023-04-18

## 2023-02-21 MED ORDER — BUPROPION HCL ER (XL) 150 MG PO TB24
ORAL_TABLET | ORAL | 3 refills | Status: DC
Start: 2023-02-21 — End: 2023-04-24

## 2023-02-21 MED ORDER — AMLODIPINE BESYLATE 5 MG PO TABS
5.0000 mg | ORAL_TABLET | Freq: Every day | ORAL | 1 refills | Status: DC
Start: 2023-02-21 — End: 2023-05-22

## 2023-02-21 MED ORDER — TIZANIDINE HCL 4 MG PO TABS
4.0000 mg | ORAL_TABLET | Freq: Three times a day (TID) | ORAL | 1 refills | Status: DC | PRN
Start: 2023-02-21 — End: 2023-03-21

## 2023-02-21 MED ORDER — BUSPIRONE HCL 5 MG PO TABS: 15.0000 mg | ORAL_TABLET | Freq: Two times a day (BID) | ORAL | 1 refills | Status: DC

## 2023-02-21 MED ORDER — FREESTYLE LIBRE 3 READER DEVI
1.0000 | 3 refills | Status: AC
Start: 2023-02-21 — End: ?

## 2023-02-21 NOTE — Assessment & Plan Note (Signed)
Seen by Dr. Vickey Huger with sleep medicine. Started on buspar. Referral to mental health has been placed and appointment scheduled for July. Lack of sleep is her main concern.

## 2023-02-21 NOTE — Progress Notes (Signed)
Established Patient Office Visit     CC/Reason for Visit: 6-week follow-up chronic medical conditions  HPI: Tracey Beard is a 42 y.o. female who is coming in today for the above mentioned reasons. Past Medical History is significant for: Newly diagnosed diabetes, hypertension, transaminitis, hyperlipidemia, vitamin D deficiency.  She was recently seen by sleep medicine and referred to behavioral health, appointment has been scheduled for July.  She is believed to have significant anxiety and PTSD causing her sleep disorder.  She was started on buspirone about 10 days ago.  Still no significant improvement.  Blood pressure has not been well-controlled, her CBGs have improved.  She believes she has a vaginal yeast infection and is requesting treatment.  She is very discouraged by her difficulty sleeping.   Past Medical/Surgical History: Past Medical History:  Diagnosis Date   Anxiety    Chronic back pain    Depression    Gestational diabetes    glyburide   Headache    Hypertension    Idiopathic intracranial hypertension    Leukocytosis 02/25/2015   Obesity    Pregnancy induced hypertension    labetalol    Past Surgical History:  Procedure Laterality Date   CESAREAN SECTION N/A 01/29/2019   Procedure: CESAREAN SECTION;  Surgeon: Mitchel Honour, DO;  Location: [facility];  Service: Obstetrics;  Laterality: N/A;   CHOLECYSTECTOMY     LUMBAR PUNCTURE  2014   5x    Social History:  reports that she has never smoked. She has never used smokeless tobacco. She reports current alcohol use. She reports that she does not use drugs.  Allergies: Allergies  Allergen Reactions   Elavil [Amitriptyline Hcl] Shortness Of Breath   Restoril [Temazepam] Shortness Of Breath   Trazodone And Nefazodone Anaphylaxis   Diamox [Acetazolamide] Other (See Comments)    Cold chills/loss of taste    Tomato Swelling and Other (See Comments)    Causes lips to swell   Maxalt [Rizatriptan Benzoate]  Other (See Comments)    Chest tightness and respiratory distress   Norco [Hydrocodone-Acetaminophen] Itching   Pollen Extract Other (See Comments)    Unknown reaction    Benzocaine Swelling, Rash and Other (See Comments)    Localized swelling   Latex Rash   Novocain [Procaine] Swelling, Rash and Other (See Comments)    Localized swelling    Family History:  Family History  Problem Relation Age of Onset   Hypertension Mother    Breast cancer Mother    Pancreatic cancer Mother    Diabetes Mother    Hypertension Father    Hypertension Maternal Grandmother    Heart disease Maternal Grandmother    Hypertension Maternal Grandfather    Diabetes Maternal Grandfather    Colon cancer Paternal Grandmother    Heart disease Paternal Grandfather    Hypertension Maternal Aunt    Breast cancer Maternal Aunt    Diabetes Maternal Aunt    Hypertension Maternal Uncle    Liver cancer Maternal Uncle    Prostate cancer Maternal Uncle    Miscarriages / [place] Daughter      Current Outpatient Medications:    amLODipine (NORVASC) 5 MG tablet, Take 1 tablet (5 mg total) by mouth daily., Disp: 90 tablet, Rfl: 1   atorvastatin (LIPITOR) 20 MG tablet, Take 1 tablet (20 mg total) by mouth daily., Disp: 90 tablet, Rfl: 1   busPIRone (BUSPAR) 5 MG tablet, Take 1 tablet (5 mg total) by mouth 3 (three) times daily.,  Disp: 90 tablet, Rfl: 0   Continuous Glucose Receiver (FREESTYLE LIBRE 3 READER) DEVI, 1 each by Does not apply route See admin instructions., Disp: 2 each, Rfl: 3   Continuous Glucose Sensor (FREESTYLE LIBRE 3 SENSOR) MISC, 1 each by Does not apply route See admin instructions. Place 1 sensor on the skin every 14 days. Use to check glucose continuously, Disp: 2 each, Rfl: 3   esomeprazole (NEXIUM) 40 MG capsule, Take 1 capsule (40 mg total) by mouth 2 (two) times daily before a meal., Disp: 60 capsule, Rfl: 5   fluconazole (DIFLUCAN) 150 MG tablet, Take 1 tablet (150 mg total) by mouth once  for 1 dose., Disp: 1 tablet, Rfl: 0   ibuprofen (ADVIL) 800 MG tablet, Take 1 tablet (800 mg total) by mouth every 6 (six) hours as needed for moderate pain., Disp: 30 tablet, Rfl: 0   ondansetron (ZOFRAN) 4 MG tablet, Take 1 tablet (4 mg total) by mouth every 6 (six) hours., Disp: 12 tablet, Rfl: 0   Semaglutide, 1 MG/DOSE, 4 MG/3ML SOPN, Inject 1 mg as directed once a week., Disp: 3 mL, Rfl: 2   triamterene-hydrochlorothiazide (MAXZIDE) 75-50 MG tablet, Take 1 tablet by mouth daily., Disp: 90 tablet, Rfl: 0   Vitamin D, Ergocalciferol, (DRISDOL) 1.25 MG (50000 UNIT) CAPS capsule, Take 1 capsule (50,000 Units total) by mouth every 7 (seven) days., Disp: 12 capsule, Rfl: 0   tiZANidine (ZANAFLEX) 4 MG tablet, Take 1 tablet (4 mg total) by mouth 3 (three) times daily as needed for muscle spasms., Disp: 30 tablet, Rfl: 1  Review of Systems:  Negative unless indicated in HPI.   Physical Exam: Vitals:   02/21/23 0659 02/21/23 0702  BP: (!) 142/103 (!) 141/96  Pulse: (!) 125   Temp: 98.9 F (37.2 C)   TempSrc: Oral   SpO2: 98%   Weight: 217 lb 9.6 oz (98.7 kg)     Body mass index is 38.55 kg/m.   Physical Exam Vitals reviewed.  Constitutional:      Appearance: Normal appearance.  HENT:     Head: Normocephalic and atraumatic.  Eyes:     Conjunctiva/sclera: Conjunctivae normal.     Pupils: Pupils are equal, round, and reactive to light.  Cardiovascular:     Rate and Rhythm: Normal rate and regular rhythm.  Pulmonary:     Effort: Pulmonary effort is normal.     Breath sounds: Normal breath sounds.  Skin:    General: Skin is warm and dry.  Neurological:     General: No focal deficit present.     Mental Status: She is alert and oriented to person, place, and time.  Psychiatric:        Mood and Affect: Mood normal.        Behavior: Behavior normal.        Thought Content: Thought content normal.        Judgment: Judgment normal.      Impression and Plan:  Type 2 diabetes  mellitus with other specified complication, without long-term current use of insulin (HCC) Assessment & Plan: Tolerating ozempic well. Too early to recheck A1c. This is a new diagnosis from April. Increase ozempic to 1 mg weekly with next refill.  Orders: -     Microalbumin / creatinine urine ratio -     FreeStyle Libre 3 Reader; 1 each by Does not apply route See admin instructions.  Dispense: 2 each; Refill: 3 -     FreeStyle Libre 3 Sensor;  1 each by Does not apply route See admin instructions. Place 1 sensor on the skin every 14 days. Use to check glucose continuously  Dispense: 2 each; Refill: 3 -     Semaglutide (1 MG/DOSE); Inject 1 mg as directed once a week.  Dispense: 3 mL; Refill: 2  Fatigue, unspecified type -     tiZANidine HCl; Take 1 tablet (4 mg total) by mouth 3 (three) times daily as needed for muscle spasms.  Dispense: 30 tablet; Refill: 1  Hyperlipidemia associated with type 2 diabetes mellitus (HCC) Assessment & Plan: Tolerating atorvastatin well. Recheck lipids next visit.   Vitamin D deficiency Assessment & Plan: On high dose supplementation. Recheck levels next visit.   Primary hypertension Assessment & Plan: BP remains uncontrolled despite use of maxzide. Add amlodipine 5 mg daily. Return in 6 weeks for follow up.  Orders: -     amLODIPine Besylate; Take 1 tablet (5 mg total) by mouth daily.  Dispense: 90 tablet; Refill: 1  GAD (generalized anxiety disorder) Assessment & Plan: Seen by Dr. Vickey Huger with sleep medicine. Started on buspar. Referral to mental health has been placed and appointment scheduled for July. Lack of sleep is her main concern.   Transaminitis Assessment & Plan: She will DC tylenol. Recheck next visit. If still elevated consider RUQ [place]. ?fatty liver.   Vaginal yeast infection -     Fluconazole; Take 1 tablet (150 mg total) by mouth once for 1 dose.  Dispense: 1 tablet; Refill: 0   -Empiric treatment with fluconazole for presumed  vaginal yeast. If no improvement consider wet prep next visit.  Time spent:34 minutes reviewing chart, interviewing and examining patient and formulating plan of care.     Chaya Jan, MD [facility]

## 2023-02-21 NOTE — Assessment & Plan Note (Signed)
On high dose supplementation. Recheck levels next visit.

## 2023-02-21 NOTE — Assessment & Plan Note (Signed)
Tolerating atorvastatin well. Recheck lipids next visit.

## 2023-02-21 NOTE — Assessment & Plan Note (Signed)
BP remains uncontrolled despite use of maxzide. Add amlodipine 5 mg daily. Return in 6 weeks for follow up.
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
## 2023-02-21 NOTE — Assessment & Plan Note (Signed)
Tolerating ozempic well. Too early to recheck A1c. This is a new diagnosis from April. Increase ozempic to 1 mg weekly with next refill.

## 2023-02-21 NOTE — Assessment & Plan Note (Signed)
She will DC tylenol. Recheck next visit. If still elevated consider RUQ [place]. ?fatty liver.

## 2023-02-26 ENCOUNTER — Other Ambulatory Visit: Payer: Self-pay | Admitting: Internal Medicine

## 2023-02-26 DIAGNOSIS — R5383 Other fatigue: Secondary | ICD-10-CM

## 2023-03-06 ENCOUNTER — Ambulatory Visit (INDEPENDENT_AMBULATORY_CARE_PROVIDER_SITE_OTHER): Payer: No Typology Code available for payment source | Admitting: Licensed Clinical Social Worker

## 2023-03-06 DIAGNOSIS — F4310 Post-traumatic stress disorder, unspecified: Secondary | ICD-10-CM | POA: Diagnosis not present

## 2023-03-13 ENCOUNTER — Other Ambulatory Visit (HOSPITAL_COMMUNITY): Payer: Self-pay

## 2023-03-13 ENCOUNTER — Ambulatory Visit (HOSPITAL_COMMUNITY): Payer: No Typology Code available for payment source | Admitting: Licensed Clinical Social Worker

## 2023-03-13 DIAGNOSIS — F5105 Insomnia due to other mental disorder: Secondary | ICD-10-CM

## 2023-03-13 MED ORDER — ZOLPIDEM TARTRATE 10 MG PO TABS
10.0000 mg | ORAL_TABLET | Freq: Every evening | ORAL | 0 refills | Status: DC | PRN
Start: 2023-03-13 — End: 2023-07-10

## 2023-03-20 ENCOUNTER — Ambulatory Visit (INDEPENDENT_AMBULATORY_CARE_PROVIDER_SITE_OTHER): Payer: No Typology Code available for payment source | Admitting: Licensed Clinical Social Worker

## 2023-03-20 DIAGNOSIS — F331 Major depressive disorder, recurrent, moderate: Secondary | ICD-10-CM

## 2023-03-21 ENCOUNTER — Other Ambulatory Visit: Payer: Self-pay | Admitting: Internal Medicine

## 2023-03-21 ENCOUNTER — Ambulatory Visit: Payer: Self-pay | Admitting: Internal Medicine

## 2023-03-21 DIAGNOSIS — R5383 Other fatigue: Secondary | ICD-10-CM

## 2023-03-27 ENCOUNTER — Ambulatory Visit (INDEPENDENT_AMBULATORY_CARE_PROVIDER_SITE_OTHER): Payer: No Typology Code available for payment source | Admitting: Licensed Clinical Social Worker

## 2023-03-27 DIAGNOSIS — F331 Major depressive disorder, recurrent, moderate: Secondary | ICD-10-CM

## 2023-03-27 DIAGNOSIS — F4321 Adjustment disorder with depressed mood: Secondary | ICD-10-CM | POA: Diagnosis not present

## 2023-03-28 ENCOUNTER — Encounter (HOSPITAL_COMMUNITY): Payer: Self-pay | Admitting: Licensed Clinical Social Worker

## 2023-03-28 ENCOUNTER — Ambulatory Visit (HOSPITAL_COMMUNITY): Payer: Self-pay | Admitting: Psychiatry

## 2023-03-28 ENCOUNTER — Encounter (HOSPITAL_COMMUNITY): Payer: Self-pay
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
## 2023-04-03 ENCOUNTER — Ambulatory Visit (HOSPITAL_COMMUNITY): Payer: No Typology Code available for payment source | Admitting: Licensed Clinical Social Worker

## 2023-04-03 ENCOUNTER — Encounter (HOSPITAL_COMMUNITY): Payer: Self-pay | Admitting: Licensed Clinical Social Worker

## 2023-04-03 ENCOUNTER — Encounter (HOSPITAL_COMMUNITY): Payer: Self-pay

## 2023-04-03 NOTE — Progress Notes (Signed)
   THERAPIST PROGRESS NOTE  Session Time: 10:00am-10:55am  Participation Level: Active  Behavioral Response: Well GroomedAlertDepressed  Type of Therapy: Individual Therapy  Treatment Goals addressed: LTG: Reduce frequency, intensity, and duration of depression symptoms so that daily functioning is improved (OP Depression) Disciplines:  Interdisciplinary, PROVIDER Expected end:  08/28/23 LTG: Increase coping skills to manage depression and improve ability to perform daily activities (OP Depression) Disciplines:  Interdisciplinary, PROVIDER Expected end:  08/28/23  ProgressTowards Goals: Progressing  Interventions: CBT  Summary: Nithya Meriweather is a 42 y.o. female who presents with MDD, recurrent, moderate.   Suicidal/Homicidal: Nowithout intent/plan  Therapist Response: Kyria engaged well in individual session with clinician. Clinician utilized CBT to process thoughts, feelings, and interactions with husband, friend, and child. Clinician assisted in processing recent interactions with friend, who has been more interested in having an intimate relationship than just friendship. Clinician processed concerns about this, particularly since he seems to be very possessive, demanding, and manipulative. Clinician provided psychoeducation about this personality type, noting this is similar to husband. Clinician also noted that narcissistic individuals tend to seek out people who are more co-dependent and emotionally weak due to their ability to receive support and comfort, as well as validation for their misbehavior.   Plan: Return again in 1-2 weeks.  Diagnosis: MDD (major depressive disorder), recurrent episode, moderate (HCC)  Collaboration of Care: Patient refused AEB none required  Patient/Guardian was advised Release of Information must be obtained prior to any record release in order to collaborate their care with an outside provider. Patient/Guardian was advised if they have not  already done so to contact the registration department to sign all necessary forms in order for [place] to release information regarding their care.   Consent: Patient/Guardian gives verbal consent for treatment and assignment of benefits for services provided during this visit. Patient/Guardian expressed understanding and agreed to proceed.   [name] [place], LCSW 04/03/2023

## 2023-04-10 ENCOUNTER — Ambulatory Visit (INDEPENDENT_AMBULATORY_CARE_PROVIDER_SITE_OTHER): Payer: No Typology Code available for payment source | Admitting: Licensed Clinical Social Worker

## 2023-04-10 DIAGNOSIS — F331 Major depressive disorder, recurrent, moderate: Secondary | ICD-10-CM

## 2023-04-10 DIAGNOSIS — F4321 Adjustment disorder with depressed mood: Secondary | ICD-10-CM

## 2023-04-13 ENCOUNTER — Other Ambulatory Visit: Payer: Self-pay | Admitting: Internal Medicine

## 2023-04-13 ENCOUNTER — Other Ambulatory Visit (HOSPITAL_COMMUNITY): Payer: Self-pay | Admitting: Psychiatry

## 2023-04-13 DIAGNOSIS — E1169 Type 2 diabetes mellitus with other specified complication: Secondary | ICD-10-CM

## 2023-04-16 ENCOUNTER — Encounter (HOSPITAL_COMMUNITY): Payer: Self-pay | Admitting: Licensed Clinical Social Worker

## 2023-04-16 NOTE — Progress Notes (Signed)
   THERAPIST PROGRESS NOTE  Session Time: 3:30pm-4:30pm  Participation Level: Active  Behavioral Response: Well GroomedAlertDepressed  Type of Therapy: Individual Therapy  Treatment Goals addressed:  LTG: Reduce frequency, intensity, and duration of depression symptoms so that daily functioning is improved (OP Depression)  Disciplines:  Interdisciplinary, PROVIDER    Expected end:  08/28/23          LTG: Increase coping skills to manage depression and improve ability to perform daily activities (OP Depression)  Disciplines:  Interdisciplinary, PROVIDER    Expected end:  08/28/23       ProgressTowards Goals: Progressing  Interventions: CBT  Summary: Tracey Beard is a 42 y.o. female who presents with MDD, recurrent, moderate and grief.   Suicidal/Homicidal: Nowithout intent/plan  Therapist Response: [name] engaged well in individual session with clinician. Clinician utilized CBT to process thoughts, feelings, and behaviors at home and in community. Clinician assessed for readiness in talking about grief over loss of stillborn baby and her mother within the same 30 days. Clinician provided time and space for [name] to share her [place] and to cry as needed throughout the telling. Clinician offered supportive counseling, as well as normalized the experience of feeling guilty, ashamed, heartbroken, devastated, and depressed. [name] also shared feeling guilt for not being present for mom while she was in hospice, but she was able to say goodbye, tell mom her truth, and be there when mom took her last breath. Clinician validated feelings and encouraged self forgiveness and self-compassion.   Plan: Return again in 1 weeks.  Diagnosis: MDD (major depressive disorder), recurrent episode, moderate (HCC)  Grief  Collaboration of Care: Patient refused AEB none required  Patient/Guardian was advised Release of Information must be obtained prior to any record release in order to  collaborate their care with an outside provider. Patient/Guardian was advised if they have not already done so to contact the registration department to sign all necessary forms in order for [place] to release information regarding their care.   Consent: Patient/Guardian gives verbal consent for treatment and assignment of benefits for services provided during this visit. Patient/Guardian expressed understanding and agreed to proceed.   [name] [place], LCSW 04/16/2023

## 2023-04-17 ENCOUNTER — Ambulatory Visit (INDEPENDENT_AMBULATORY_CARE_PROVIDER_SITE_OTHER): Payer: No Typology Code available for payment source | Admitting: Licensed Clinical Social Worker

## 2023-04-17 ENCOUNTER — Encounter (HOSPITAL_COMMUNITY): Payer: Self-pay | Admitting: Licensed Clinical Social Worker

## 2023-04-17 DIAGNOSIS — F4321 Adjustment disorder with depressed mood: Secondary | ICD-10-CM | POA: Diagnosis not present

## 2023-04-17 DIAGNOSIS — F331 Major depressive disorder, recurrent, moderate: Secondary | ICD-10-CM

## 2023-04-17 NOTE — Progress Notes (Signed)
   THERAPIST PROGRESS NOTE  Session Time: 9:00am-10:00am  Participation Level: Active  Behavioral Response: Well GroomedAlertDepressed  Type of Therapy: Individual Therapy  Treatment Goals addressed:  LTG: Reduce frequency, intensity, and duration of depression symptoms so that daily functioning is improved (OP Depression)  Disciplines:  Interdisciplinary, PROVIDER    Expected end:  08/28/23          LTG: Increase coping skills to manage depression and improve ability to perform daily activities (OP Depression)  Disciplines:  Interdisciplinary, PROVIDER    Expected end:  08/28/23         ProgressTowards Goals: Progressing  Interventions: CBT  Summary: Tracey Beard is a 42 y.o. female who presents with MDD, recurrent, moderate. grief.   Suicidal/Homicidal: Nowithout intent/plan  Therapist Response: Aaralyn engaged well in individual session with clinician. Clinician utilized CBT to process thoughts, feelings, and interactions with family and friends since last week. Clinician reviewed outcomes of last week's session and noted that the grief had been ignored for so long, it was almost as if the deaths happened recently. Clinician processed current thoughts and feelings about her new vibe, which has been drawing others toward her over the past few weeks. Tracey Beard shared that she has been receiving calls from past boyfriends, made contact with a younger man when out at the [facility], and also her husband is being more attentive. Clinician explored the vibe she is sending out and noted that she may be more confident, less weighed down since she is not holding in so much pain, and she is feeling better about herself.    Plan: Return again in 1 weeks.  Diagnosis: MDD (major depressive disorder), recurrent episode, moderate (HCC)  Grief  Collaboration of Care: Patient refused AEB none required  Patient/Guardian was advised Release of Information must be obtained prior to any record  release in order to collaborate their care with an outside provider. Patient/Guardian was advised if they have not already done so to contact the registration department to sign all necessary forms in order for [place] to release information regarding their care.   Consent: Patient/Guardian gives verbal consent for treatment and assignment of benefits for services provided during this visit. Patient/Guardian expressed understanding and agreed to proceed.   [name] [place], LCSW 04/17/2023

## 2023-04-18 ENCOUNTER — Other Ambulatory Visit: Payer: Self-pay | Admitting: Internal Medicine

## 2023-04-18 DIAGNOSIS — E1169 Type 2 diabetes mellitus with other specified complication: Secondary | ICD-10-CM

## 2023-04-19 ENCOUNTER — Telehealth: Payer: Self-pay | Admitting: *Deleted

## 2023-04-19 ENCOUNTER — Ambulatory Visit: Payer: No Typology Code available for payment source | Admitting: Internal Medicine

## 2023-04-19 ENCOUNTER — Other Ambulatory Visit: Payer: No Typology Code available for payment source

## 2023-04-19 ENCOUNTER — Encounter: Payer: Self-pay | Admitting: Internal Medicine

## 2023-04-19 ENCOUNTER — Encounter: Payer: Self-pay | Admitting: *Deleted

## 2023-04-19 ENCOUNTER — Other Ambulatory Visit: Payer: Self-pay | Admitting: Internal Medicine

## 2023-04-19 VITALS — BP 120/84 | HR 84 | Temp 98.6°F | Wt 198.0 lb

## 2023-04-19 DIAGNOSIS — E875 Hyperkalemia: Secondary | ICD-10-CM

## 2023-04-19 DIAGNOSIS — F32A Depression, unspecified: Secondary | ICD-10-CM

## 2023-04-19 DIAGNOSIS — R7401 Elevation of levels of liver transaminase levels: Secondary | ICD-10-CM | POA: Diagnosis not present

## 2023-04-19 DIAGNOSIS — I10 Essential (primary) hypertension: Secondary | ICD-10-CM

## 2023-04-19 DIAGNOSIS — R350 Frequency of micturition: Secondary | ICD-10-CM

## 2023-04-19 DIAGNOSIS — E785 Hyperlipidemia, unspecified: Secondary | ICD-10-CM

## 2023-04-19 DIAGNOSIS — F5104 Psychophysiologic insomnia: Secondary | ICD-10-CM | POA: Diagnosis not present

## 2023-04-19 DIAGNOSIS — F419 Anxiety disorder, unspecified: Secondary | ICD-10-CM | POA: Diagnosis not present

## 2023-04-19 DIAGNOSIS — E1169 Type 2 diabetes mellitus with other specified complication: Secondary | ICD-10-CM | POA: Diagnosis not present

## 2023-04-19 DIAGNOSIS — E559 Vitamin D deficiency, unspecified: Secondary | ICD-10-CM | POA: Diagnosis not present

## 2023-04-19 DIAGNOSIS — Z7985 Long-term (current) use of injectable non-insulin antidiabetic drugs: Secondary | ICD-10-CM

## 2023-04-19 LAB — COMPREHENSIVE METABOLIC PANEL
ALT: 43 U/L — ABNORMAL HIGH (ref 0–35)
AST: 26 U/L (ref 0–37)
Albumin: 5.2 g/dL (ref 3.5–5.2)
Alkaline Phosphatase: 92 U/L (ref 39–117)
BUN: 10 mg/dL (ref 6–23)
CO2: 26 mEq/L (ref 19–32)
Calcium: 10.6 mg/dL — ABNORMAL HIGH (ref 8.4–10.5)
Chloride: 97 mEq/L (ref 96–112)
Creatinine, Ser: 0.99 mg/dL (ref 0.40–1.20)
GFR: 71.55 mL/min (ref >60.00)
Glucose, Bld: 115 mg/dL — ABNORMAL HIGH (ref 70–99)
Potassium: 2.8 mEq/L — CL (ref 3.5–5.1)
Sodium: 137 mEq/L (ref 135–145)
Total Bilirubin: 0.8 mg/dL (ref 0.2–1.2)
Total Protein: 8.6 g/dL — ABNORMAL HIGH (ref 6.0–8.3)

## 2023-04-19 LAB — URINALYSIS, ROUTINE W REFLEX MICROSCOPIC
Bilirubin Urine: NEGATIVE
Hgb urine dipstick: NEGATIVE
Ketones, ur: NEGATIVE
Leukocytes,Ua: NEGATIVE
Nitrite: NEGATIVE
Specific Gravity, Urine: 1.025 (ref 1.000–1.030)
Total Protein, Urine: NEGATIVE
Urine Glucose: NEGATIVE
Urobilinogen, UA: 2 — AB (ref 0.0–1.0)
pH: 6 (ref 5.0–8.0)

## 2023-04-19 LAB — LIPID PANEL
Cholesterol: 137 mg/dL (ref 0–200)
HDL: 58.2 mg/dL (ref >39.00)
LDL Cholesterol: 54 mg/dL (ref 0–99)
NonHDL: 78.62
Total CHOL/HDL Ratio: 2
Triglycerides: 123 mg/dL (ref 0.0–149.0)
VLDL: 24.6 mg/dL (ref 0.0–40.0)

## 2023-04-19 LAB — MICROALBUMIN / CREATININE URINE RATIO
Creatinine,U: 367.1 mg/dL
Microalb Creat Ratio: 0.7 mg/g (ref 0.0–30.0)
Microalb, Ur: 2.6 mg/dL — ABNORMAL HIGH (ref 0.0–1.9)

## 2023-04-19 LAB — VITAMIN D 25 HYDROXY (VIT D DEFICIENCY, FRACTURES): VITD: 37.57 ng/mL (ref 30.00–100.00)

## 2023-04-19 LAB — POCT GLYCOSYLATED HEMOGLOBIN (HGB A1C): Hemoglobin A1C: 6.7 % — AB (ref 4.0–5.6)

## 2023-04-19 LAB — MAGNESIUM: Magnesium: 2 mg/dL (ref 1.5–2.5)

## 2023-04-19 MED ORDER — POTASSIUM CHLORIDE CRYS ER 20 MEQ PO TBCR
20.0000 meq | EXTENDED_RELEASE_TABLET | Freq: Two times a day (BID) | ORAL | 0 refills | Status: DC
Start: 2023-04-19 — End: 2024-02-05

## 2023-04-19 NOTE — Telephone Encounter (Signed)
Faxed  lab to add Magnesium level. Added labs   Sent potasium 20 meq BID for 5 days also called the patient to make her aware and to come in to  recheck BMP next week.

## 2023-04-19 NOTE — Assessment & Plan Note (Signed)
Excellent control with an A1c at 6.7 down from 9.8 only 3 months prior.  Continue Ozempic 1 mg weekly.

## 2023-04-19 NOTE — Telephone Encounter (Signed)
Lab appointment scheduled 

## 2023-04-19 NOTE — Telephone Encounter (Signed)
CRITICAL VALUE STICKER  CRITICAL VALUE: Potassium 2.8  RECEIVER (on-site recipient of call): Fleet Contras CMA  DATE & TIME NOTIFIED: 04/19/23   MESSENGER (representative from lab): [facility]  MD NOTIFIED: Dr Ardyth Harps  TIME OF NOTIFICATION:12:56 pm  RESPONSE:  waiting on response

## 2023-04-19 NOTE — Assessment & Plan Note (Signed)
Check lipids after being on atorvastatin for 3 months.

## 2023-04-19 NOTE — Assessment & Plan Note (Signed)
On BuSpar, Wellbutrin, followed by psychiatry, improved.

## 2023-04-19 NOTE — Assessment & Plan Note (Signed)
Recheck levels today

## 2023-04-19 NOTE — Progress Notes (Signed)
Established Patient Office Visit     CC/Reason for Visit: Follow-up chronic conditions  HPI: Tracey Beard is a 42 y.o. female who is coming in today for the above mentioned reasons. Past Medical History is significant for: Newly diagnosed type 2 diabetes, hypertension, hyperlipidemia, vitamin D deficiency, morbid obesity, transaminitis.  She has been working on lifestyle changes.  She was also diagnosed with depression and insomnia by psychiatry and has been started on treatment.  She has lost about 10 pounds since her last visit.   Past Medical/Surgical History: Past Medical History:  Diagnosis Date   Anxiety    Chronic back pain    Depression    Gestational diabetes    glyburide   Headache    Hypertension    Idiopathic intracranial hypertension    Leukocytosis 02/25/2015   Obesity    Pregnancy induced hypertension    labetalol    Past Surgical History:  Procedure Laterality Date   CESAREAN SECTION N/A 01/29/2019   Procedure: CESAREAN SECTION;  Surgeon: Mitchel Honour, DO;  Location: [facility];  Service: Obstetrics;  Laterality: N/A;   CHOLECYSTECTOMY     LUMBAR PUNCTURE  2014   5x    Social History:  reports that she has never smoked. She has never used smokeless tobacco. She reports current alcohol use. She reports that she does not use drugs.  Allergies: Allergies  Allergen Reactions   Elavil [Amitriptyline Hcl] Shortness Of Breath   Restoril [Temazepam] Shortness Of Breath   Trazodone And Nefazodone Anaphylaxis   Diamox [Acetazolamide] Other (See Comments)    Cold chills/loss of taste    Tomato Swelling and Other (See Comments)    Causes lips to swell   Maxalt [Rizatriptan Benzoate] Other (See Comments)    Chest tightness and respiratory distress   Norco [Hydrocodone-Acetaminophen] Itching   Pollen Extract Other (See Comments)    Unknown reaction    Benzocaine Swelling, Rash and Other (See Comments)    Localized swelling   Latex Rash   Novocain  [Procaine] Swelling, Rash and Other (See Comments)    Localized swelling    Family History:  Family History  Problem Relation Age of Onset   Hypertension Mother    Breast cancer Mother    Pancreatic cancer Mother    Diabetes Mother    Hypertension Father    Hypertension Maternal Grandmother    Heart disease Maternal Grandmother    Hypertension Maternal Grandfather    Diabetes Maternal Grandfather    Colon cancer Paternal Grandmother    Heart disease Paternal Grandfather    Hypertension Maternal Aunt    Breast cancer Maternal Aunt    Diabetes Maternal Aunt    Hypertension Maternal Uncle    Liver cancer Maternal Uncle    Prostate cancer Maternal Uncle    Miscarriages / [place] Daughter      Current Outpatient Medications:    amLODipine (NORVASC) 5 MG tablet, Take 1 tablet (5 mg total) by mouth daily., Disp: 90 tablet, Rfl: 1   atorvastatin (LIPITOR) 20 MG tablet, TAKE ONE TABLET BY MOUTH EVERY DAY, Disp: 90 tablet, Rfl: 1   buPROPion (WELLBUTRIN XL) 150 MG 24 hr tablet, 1 qam  for 1 week then 2 qam, Disp: 60 tablet, Rfl: 3   busPIRone (BUSPAR) 5 MG tablet, Take 3 tablets (15 mg total) by mouth 2 (two) times daily., Disp: 180 tablet, Rfl: 1   Continuous Glucose Receiver (FREESTYLE LIBRE 3 READER) DEVI, 1 each by Does not apply  route See admin instructions., Disp: 2 each, Rfl: 3   Continuous Glucose Sensor (FREESTYLE LIBRE 3 SENSOR) MISC, 1 each by Does not apply route See admin instructions. Place 1 sensor on the skin every 14 days. Use to check glucose continuously, Disp: 2 each, Rfl: 3   esomeprazole (NEXIUM) 40 MG capsule, Take 1 capsule (40 mg total) by mouth 2 (two) times daily before a meal., Disp: 60 capsule, Rfl: 5   ibuprofen (ADVIL) 800 MG tablet, Take 1 tablet (800 mg total) by mouth every 6 (six) hours as needed for moderate pain., Disp: 30 tablet, Rfl: 0   ondansetron (ZOFRAN) 4 MG tablet, Take 1 tablet (4 mg total) by mouth every 6 (six) hours., Disp: 12 tablet,  Rfl: 0   OZEMPIC, 1 MG/DOSE, 4 MG/3ML SOPN, Inject 1 mg as directed once a week., Disp: 3 mL, Rfl: 2   tiZANidine (ZANAFLEX) 4 MG tablet, Take 1 tablet (4 mg total) by mouth 3 (three) times daily as needed for muscle spasms., Disp: 30 tablet, Rfl: 1   triamterene-hydrochlorothiazide (MAXZIDE) 75-50 MG tablet, Take 1 tablet by mouth daily., Disp: 90 tablet, Rfl: 0   Vitamin D, Ergocalciferol, (DRISDOL) 1.25 MG (50000 UNIT) CAPS capsule, Take 1 capsule (50,000 Units total) by mouth every 7 (seven) days., Disp: 12 capsule, Rfl: 0   zolpidem (AMBIEN) 10 MG tablet, Take 1 tablet (10 mg total) by mouth at bedtime as needed for sleep., Disp: 30 tablet, Rfl: 0  Review of Systems:  Negative unless indicated in HPI.   Physical Exam: Vitals:   04/19/23 0701  BP: 120/84  Pulse: 84  Temp: 98.6 F (37 C)  TempSrc: Oral  SpO2: 98%  Weight: 198 lb (89.8 kg)    Body mass index is 37.41 kg/m.   Physical Exam Vitals reviewed.  Constitutional:      Appearance: Normal appearance.  HENT:     Head: Normocephalic and atraumatic.  Eyes:     Conjunctiva/sclera: Conjunctivae normal.     Pupils: Pupils are equal, round, and reactive to light.  Cardiovascular:     Rate and Rhythm: Normal rate and regular rhythm.  Pulmonary:     Effort: Pulmonary effort is normal.     Breath sounds: Normal breath sounds.  Skin:    General: Skin is warm and dry.  Neurological:     General: No focal deficit present.     Mental Status: She is alert and oriented to person, place, and time.  Psychiatric:        Mood and Affect: Mood normal.        Behavior: Behavior normal.        Thought Content: Thought content normal.        Judgment: Judgment normal.      Impression and Plan:  Type 2 diabetes mellitus with other specified complication, without long-term current use of insulin (HCC) Assessment & Plan: Excellent control with an A1c at 6.7 down from 9.8 only 3 months prior.  Continue Ozempic 1 mg  weekly.  Orders: -     Microalbumin / creatinine urine ratio; Future -     POCT glycosylated hemoglobin (Hb A1C)  Anxiety and depression Assessment & Plan: On BuSpar, Wellbutrin, followed by psychiatry, improved.   Chronic insomnia Assessment & Plan: Now followed by psychiatry, significantly improved on Restoril.   Transaminitis Assessment & Plan: Suspect related to new diagnosis of diabetes, obesity and hyperlipidemia, recheck LFTs today.  Orders: -     Comprehensive metabolic panel; Future  Vitamin D deficiency Assessment & Plan: Recheck levels today.  Orders: -     VITAMIN D 25 Hydroxy (Vit-D Deficiency, Fractures); Future  Hyperlipidemia associated with type 2 diabetes mellitus (HCC) Assessment & Plan: Check lipids after being on atorvastatin for 3 months.  Orders: -     Lipid panel; Future  Primary hypertension Assessment & Plan: Well-controlled on current.   Urine frequency -     Urine Culture -     Urinalysis, Routine w reflex microscopic     Time spent:33 minutes reviewing chart, interviewing and examining patient and formulating plan of care.     Chaya Jan, MD  Primary Care at [facility]

## 2023-04-19 NOTE — Assessment & Plan Note (Signed)
Now followed by psychiatry, significantly improved on Restoril.

## 2023-04-19 NOTE — Assessment & Plan Note (Signed)
Suspect related to new diagnosis of diabetes, obesity and hyperlipidemia, recheck LFTs today.

## 2023-04-19 NOTE — Assessment & Plan Note (Signed)
Well controlled on current

## 2023-04-24 ENCOUNTER — Ambulatory Visit (HOSPITAL_COMMUNITY): Payer: No Typology Code available for payment source | Admitting: Licensed Clinical Social Worker

## 2023-04-24 ENCOUNTER — Other Ambulatory Visit: Payer: Self-pay

## 2023-04-24 ENCOUNTER — Ambulatory Visit (HOSPITAL_BASED_OUTPATIENT_CLINIC_OR_DEPARTMENT_OTHER): Payer: No Typology Code available for payment source | Admitting: Psychiatry

## 2023-04-24 ENCOUNTER — Other Ambulatory Visit (HOSPITAL_COMMUNITY): Payer: Self-pay | Admitting: *Deleted

## 2023-04-24 ENCOUNTER — Encounter (HOSPITAL_COMMUNITY): Payer: Self-pay | Admitting: Psychiatry

## 2023-04-24 VITALS — Ht 61.0 in | Wt 200.0 lb

## 2023-04-24 DIAGNOSIS — F325 Major depressive disorder, single episode, in full remission: Secondary | ICD-10-CM

## 2023-04-24 MED ORDER — BUPROPION HCL ER (XL) 150 MG PO TB24: ORAL_TABLET | ORAL | 3 refills | Status: DC

## 2023-04-24 MED ORDER — BUSPIRONE HCL 5 MG PO TABS: 15.0000 mg | ORAL_TABLET | Freq: Two times a day (BID) | ORAL | 1 refills | Status: DC

## 2023-04-24 MED ORDER — BUPROPION HCL ER (XL) 300 MG PO TB24: ORAL_TABLET | ORAL | 5 refills | Status: DC

## 2023-04-24 MED ORDER — TEMAZEPAM 15 MG PO CAPS: ORAL_CAPSULE | ORAL | 4 refills | Status: DC

## 2023-04-24 NOTE — Progress Notes (Signed)
Psychiatric Initial Adult Assessment   Patient Identification: Tracey Beard MRN:  [id number] Date of Evaluation:  04/24/2023 Referral Source: Dr. Porfirio Mylar Dohmeier Chief Complaint: Depression Visit Diagnosis:   History of Present Illness:   Today the patient is doing much better.  She is sleeping well taking Restoril.  Her anxiety is better.  Her mood is much improved.  She is enjoying things again.  She goes to the [place] and [address]  The patient is eating well.  Suicidal thinking as long.  She continues taking Wellbutrin as prescribed.  She is doing well in therapy.  She is really trying to work on herself.  Her son is age 69 is doing well and her husband is beginning to react differently.  He at apparently is being more attentive. Associated Signs/Symptoms: Depression Symptoms:   (Hypo) Manic Symptoms:   Anxiety Symptoms:   Psychotic Symptoms:   PTSD Symptoms: NA  Past Psychiatric History:   Previous Psychotropic Medications: Yes   Substance Abuse History in the last 12 months:  Yes.    Consequences of Substance Abuse:   Past Medical History:  Past Medical History:  Diagnosis Date   Anxiety    Chronic back pain    Depression    Gestational diabetes    glyburide   Headache    Hypertension    Idiopathic intracranial hypertension    Leukocytosis 02/25/2015   Obesity    Pregnancy induced hypertension    labetalol    Past Surgical History:  Procedure Laterality Date   CESAREAN SECTION N/A 01/29/2019   Procedure: CESAREAN SECTION;  Surgeon: Mitchel Honour, DO;  Location: [facility];  Service: Obstetrics;  Laterality: N/A;   CHOLECYSTECTOMY     LUMBAR PUNCTURE  2014   5x    Family Psychiatric History:   Family History:  Family History  Problem Relation Age of Onset   Hypertension Mother    Breast cancer Mother    Pancreatic cancer Mother    Diabetes Mother    Hypertension Father    Hypertension Maternal Grandmother    Heart disease Maternal Grandmother     Hypertension Maternal Grandfather    Diabetes Maternal Grandfather    Colon cancer Paternal Grandmother    Heart disease Paternal Grandfather    Hypertension Maternal Aunt    Breast cancer Maternal Aunt    Diabetes Maternal Aunt    Hypertension Maternal Uncle    Liver cancer Maternal Uncle    Prostate cancer Maternal Uncle    Miscarriages / [place] Daughter     Social History:   Social History   Socioeconomic History   Marital status: Married    Spouse name: Not on file   Number of children: 2   Years of education: Not on file   Highest education level: Associate degree: occupational, Scientist, product/process development, or vocational program  Occupational History   Occupation: Nurse  Tobacco Use   Smoking status: Never   Smokeless tobacco: Never  Vaping Use   Vaping status: Never Used  Substance and Sexual Activity   Alcohol use: Yes    Comment: socially   Drug use: No   Sexual activity: Not Currently    Birth control/protection: None  Other Topics Concern   Not on file  Social History Narrative   Not on file   Social Determinants of Health   Financial Resource Strain: Low Risk  (02/20/2023)   Overall Financial Resource Strain (CARDIA)    Difficulty of Paying Living Expenses: Not very hard  Food Insecurity:  No Food Insecurity (02/20/2023)   Hunger Vital Sign    Worried About Running Out of Food in the Last Year: Never true    Ran Out of Food in the Last Year: Never true  Transportation Needs: No Transportation Needs (02/20/2023)   PRAPARE - Administrator, Civil Service (Medical): No    Lack of Transportation (Non-Medical): No  Physical Activity: Insufficiently Active (02/20/2023)   Exercise Vital Sign    Days of Exercise per Week: 3 days    Minutes of Exercise per Session: 30 min  Stress: Stress Concern Present (02/20/2023)   [employer] of Occupational Health - Occupational Stress Questionnaire    Feeling of Stress : Very much  Social Connections: Unknown (02/20/2023)    Social Connection and Isolation Panel [NHANES]    Frequency of Communication with Friends and Family: Patient declined    Frequency of Social Gatherings with Friends and Family: Never    Attends Religious Services: Patient declined    Database administrator or Organizations: No    Attends Engineer, structural: Not on file    Marital Status: Married    Additional Social History:   Allergies:   Allergies  Allergen Reactions   Elavil [Amitriptyline Hcl] Shortness Of Breath   Restoril [Temazepam] Shortness Of Breath   Trazodone And Nefazodone Anaphylaxis   Diamox [Acetazolamide] Other (See Comments)    Cold chills/loss of taste    Tomato Swelling and Other (See Comments)    Causes lips to swell   Maxalt [Rizatriptan Benzoate] Other (See Comments)    Chest tightness and respiratory distress   Norco [Hydrocodone-Acetaminophen] Itching   Pollen Extract Other (See Comments)    Unknown reaction    Benzocaine Swelling, Rash and Other (See Comments)    Localized swelling   Latex Rash   Novocain [Procaine] Swelling, Rash and Other (See Comments)    Localized swelling    Metabolic Disorder Labs: Lab Results  Component Value Date   HGBA1C 6.7 (A) 04/19/2023   MPG 122.63 05/15/2022   No results found for: "PROLACTIN" Lab Results  Component Value Date   CHOL 137 04/19/2023   TRIG 123.0 04/19/2023   HDL 58.20 04/19/2023   CHOLHDL 2 04/19/2023   VLDL 24.6 04/19/2023   LDLCALC 54 04/19/2023   LDLCALC 84 01/10/2023   Lab Results  Component Value Date   TSH 1.28 01/10/2023    Therapeutic Level Labs: No results found for: "LITHIUM" No results found for: "CBMZ" No results found for: "VALPROATE"  Current Medications: Current Outpatient Medications  Medication Sig Dispense Refill   amLODipine (NORVASC) 5 MG tablet Take 1 tablet (5 mg total) by mouth daily. 90 tablet 1   atorvastatin (LIPITOR) 20 MG tablet TAKE ONE TABLET BY MOUTH EVERY DAY 90 tablet 1   Continuous  Glucose Receiver (FREESTYLE LIBRE 3 READER) DEVI 1 each by Does not apply route See admin instructions. 2 each 3   Continuous Glucose Sensor (FREESTYLE LIBRE 3 SENSOR) MISC 1 each by Does not apply route See admin instructions. Place 1 sensor on the skin every 14 days. Use to check glucose continuously 2 each 3   esomeprazole (NEXIUM) 40 MG capsule Take 1 capsule (40 mg total) by mouth 2 (two) times daily before a meal. 60 capsule 5   ibuprofen (ADVIL) 800 MG tablet Take 1 tablet (800 mg total) by mouth every 6 (six) hours as needed for moderate pain. 30 tablet 0   ondansetron (ZOFRAN) 4 MG tablet  Take 1 tablet (4 mg total) by mouth every 6 (six) hours. 12 tablet 0   OZEMPIC, 1 MG/DOSE, 4 MG/3ML SOPN Inject 1 mg as directed once a week. 3 mL 2   potassium chloride SA (KLOR-CON M) 20 MEQ tablet Take 1 tablet (20 mEq total) by mouth 2 (two) times daily for 5 days. 10 tablet 0   temazepam (RESTORIL) 15 MG capsule 2 qhs 30 capsule 4   tiZANidine (ZANAFLEX) 4 MG tablet Take 1 tablet (4 mg total) by mouth 3 (three) times daily as needed for muscle spasms. 30 tablet 1   triamterene-hydrochlorothiazide (MAXZIDE) 75-50 MG tablet Take 1 tablet by mouth daily. 90 tablet 0   Vitamin D, Ergocalciferol, (DRISDOL) 1.25 MG (50000 UNIT) CAPS capsule Take 1 capsule (50,000 Units total) by mouth every 7 (seven) days. 12 capsule 0   buPROPion (WELLBUTRIN XL) 300 MG 24 hr tablet 1 qam  for 1 week then 2 qam 30 tablet 5   busPIRone (BUSPAR) 5 MG tablet Take 3 tablets (15 mg total) by mouth 2 (two) times daily. 180 tablet 1   zolpidem (AMBIEN) 10 MG tablet Take 1 tablet (10 mg total) by mouth at bedtime as needed for sleep. 30 tablet 0   No current facility-administered medications for this visit.    Musculoskeletal: Strength & Muscle Tone: within normal limits Gait & Station: normal Patient leans: N/A  Psychiatric Specialty Exam: Review of Systems  Height 5\' 1"  (1.549 m), weight 200 lb (90.7 kg).Body mass index  is 37.79 kg/m.  General Appearance: Casual  Eye Contact:  Good  Speech:  NA  Volume:  Normal  Mood:  Anxious  Affect:  Appropriate  Thought Process:  Coherent  Orientation:  Full (Time, Place, and Person)  Thought Content:  WDL  Suicidal Thoughts:  No  Homicidal Thoughts:  No  Memory:  NA  Judgement:  Good  Insight:  Good  Psychomotor Activity:  Normal  Concentration:    Recall:  NA  Fund of Knowledge:Good  Language: Good  Akathisia:  NA  Handed:  Left  AIMS (if indicated):  not done  Assets:  Desire for Improvement  ADL's:  Intact  Cognition: WNL  Sleep:  Poor   Screenings: GAD-7    Flowsheet Row Office Visit from 04/19/2023 in [facility] [place] Office Visit from 02/21/2023 in [facility] [place] Office Visit from 01/10/2023 in [facility] [place] [place]  Total GAD-7 Score 7 16 15       PHQ2-9    Flowsheet Row Office Visit from 04/19/2023 in [facility] [place] Office Visit from 02/21/2023 in [facility] [place] Office Visit from 01/10/2023 in [facility] [place] Nutrition from 12/04/2018 in [facility] Total Score 2 6 4 2   PHQ-9 Total Score 6 18 18  --      Flowsheet Row ED from 05/22/2022 in [facility] Emergency Department at [facility] ED to Hosp-Admission (Discharged) from 05/14/2022 in [facility] 4TH FLOOR PROGRESSIVE CARE [facility] UROLOGY ED from 04/16/2021 in [facility] Emergency Department at [facility]  C-SSRS RISK CATEGORY No Risk No Risk No Risk       Assessment and Plan:     This patient has 2 problems.  The first 1 is that of major depression.  She will continue taking 300 mg of Wellbutrin.  Her second problem is insomnia.  The  patient is up to Restoril taking 15 mg 1 or 2.  But the last few days she has been sleeping so well she has skipped the  Restoril.  She also takes BuSpar 15 mg 3 times daily for associated anxiety.  Patient will be seen again in 2-1/2 months.  She will continue in one-to-one therapy.  Collaboration of Care:   Patient/Guardian was advised Release of Information must be obtained prior to any record release in order to collaborate their care with an outside provider. Patient/Guardian was advised if they have not already done so to contact the registration department to sign all necessary forms in order for [place] to release information regarding their care.   Consent: Patient/Guardian gives verbal consent for treatment and assignment of benefits for services provided during this visit. Patient/Guardian expressed understanding and agreed to proceed.   Gypsy Balsam, MD 8/6/20243:28 PM

## 2023-04-25 ENCOUNTER — Ambulatory Visit (INDEPENDENT_AMBULATORY_CARE_PROVIDER_SITE_OTHER): Payer: No Typology Code available for payment source | Admitting: Licensed Clinical Social Worker

## 2023-04-25 DIAGNOSIS — F331 Major depressive disorder, recurrent, moderate: Secondary | ICD-10-CM

## 2023-04-26 ENCOUNTER — Telehealth: Payer: Self-pay | Admitting: Internal Medicine

## 2023-04-26 ENCOUNTER — Other Ambulatory Visit (INDEPENDENT_AMBULATORY_CARE_PROVIDER_SITE_OTHER): Payer: No Typology Code available for payment source

## 2023-04-26 ENCOUNTER — Encounter (HOSPITAL_COMMUNITY): Payer: Self-pay | Admitting: Licensed Clinical Social Worker

## 2023-04-26 DIAGNOSIS — E875 Hyperkalemia: Secondary | ICD-10-CM | POA: Diagnosis not present

## 2023-04-26 LAB — BASIC METABOLIC PANEL
BUN: 11 mg/dL (ref 6–23)
CO2: 26 mEq/L (ref 19–32)
Calcium: 10.1 mg/dL (ref 8.4–10.5)
Chloride: 99 mEq/L (ref 96–112)
Creatinine, Ser: 0.86 mg/dL (ref 0.40–1.20)
GFR: 84.7 mL/min (ref >60.00)
Glucose, Bld: 112 mg/dL — ABNORMAL HIGH (ref 70–99)
Potassium: 3.5 mEq/L (ref 3.5–5.1)
Sodium: 136 mEq/L (ref 135–145)

## 2023-04-26 NOTE — Telephone Encounter (Signed)
Pt states she is waiting for a call to discuss her lab results from 8/1. She is worried about the results and would like to speak to someone about them. Call back number: [phone].

## 2023-04-26 NOTE — Telephone Encounter (Signed)
CALLED PATIENT ABOUT LABS FROM 04/19/2023, LEFT A VOICEMAIL TO RETURN MY CALL

## 2023-04-26 NOTE — Progress Notes (Signed)
Virtual Visit via Video Note  I connected with Everlean Kutscher on 04/26/23 at  9:00 AM EDT by a video enabled telemedicine application and verified that I am speaking with the correct person using two identifiers.  Location: Patient: car Provider: home office   I discussed the limitations of evaluation and management by telemedicine and the availability of in person appointments. The patient expressed understanding and agreed to proceed.   I discussed the assessment and treatment plan with the patient. The patient was provided an opportunity to ask questions and all were answered. The patient agreed with the plan and demonstrated an understanding of the instructions.   The patient was advised to call back or seek an in-person evaluation if the symptoms worsen or if the condition fails to improve as anticipated.  I provided 55 minutes of non-face-to-face time during this encounter.   Veneda Melter, LCSW   THERAPIST PROGRESS NOTE  Session Time: 9:00am-9:55am  Participation Level: Active  Behavioral Response: NeatAlertEuthymic  Type of Therapy: Individual Therapy  Treatment Goals addressed:  LTG: Reduce frequency, intensity, and duration of depression symptoms so that daily functioning is improved (OP Depression)  Disciplines:  Interdisciplinary, PROVIDER    Expected end:  08/28/23          LTG: Increase coping skills to manage depression and improve ability to perform daily activities (OP Depression)  Disciplines:  Interdisciplinary, PROVIDER    Expected end:  08/28/23         ProgressTowards Goals: Progressing  Interventions: CBT  Summary: Cacia Covalt is a 42 y.o. female who presents with MDD, recurrent, moderate.   Suicidal/Homicidal: Nowithout intent/plan  Therapist Response: Saleah engaged well in individual virtual session with Facilities manager. Clinician utilized CBT to process thoughts, feelings, and behaviors since last session. Rayya provided updates in  interactions, behaviors, and communication with husband. Clinician reflected Aanchal's ongoing improvement in attitude and noted that due to improved mood, she is attracting attention from men in the community and from her past. Tomiko shared this is surprising, and while she is not interested in having a different relationship at this time, she feels flattered and more confident. Clinician discussed the importance of self care, and noted that over the past month, Pattiann has been doing much more for herself. She also shared that meds have been very helpful in letting her get the rest she needs and she is crying less due to being able to voice her thoughts in therapy.   Plan: Return again in 1 weeks.  Diagnosis: MDD (major depressive disorder), recurrent episode, moderate (HCC)  Collaboration of Care: Psychiatrist AEB discussed updates with Dr. Donell Beers   Patient/Guardian was advised Release of Information must be obtained prior to any record release in order to collaborate their care with an outside provider. Patient/Guardian was advised if they have not already done so to contact the registration department to sign all necessary forms in order for [place] to release information regarding their care.   Consent: Patient/Guardian gives verbal consent for treatment and assignment of benefits for services provided during this visit. Patient/Guardian expressed understanding and agreed to proceed.   [name], LCSW 04/26/2023

## 2023-04-26 NOTE — Telephone Encounter (Signed)
Pt returned call

## 2023-05-01 ENCOUNTER — Ambulatory Visit (INDEPENDENT_AMBULATORY_CARE_PROVIDER_SITE_OTHER): Payer: No Typology Code available for payment source | Admitting: Licensed Clinical Social Worker

## 2023-05-01 DIAGNOSIS — F331 Major depressive disorder, recurrent, moderate: Secondary | ICD-10-CM

## 2023-05-08 ENCOUNTER — Ambulatory Visit (INDEPENDENT_AMBULATORY_CARE_PROVIDER_SITE_OTHER): Payer: No Typology Code available for payment source | Admitting: Licensed Clinical Social Worker

## 2023-05-08 DIAGNOSIS — F331 Major depressive disorder, recurrent, moderate: Secondary | ICD-10-CM | POA: Diagnosis not present

## 2023-05-09 ENCOUNTER — Encounter (HOSPITAL_COMMUNITY): Payer: Self-pay | Admitting: Licensed Clinical Social Worker

## 2023-05-09 NOTE — Progress Notes (Signed)
   THERAPIST PROGRESS NOTE  Session Time: 8:00am-8:50am  Participation Level: Active  Behavioral Response: NeatAlertEuthymic  Type of Therapy: Individual Therapy  Treatment Goals addressed:  LTG: Reduce frequency, intensity, and duration of depression symptoms so that daily functioning is improved (OP Depression)  Disciplines:  Interdisciplinary, PROVIDER    Expected end:  08/28/23          LTG: Increase coping skills to manage depression and improve ability to perform daily activities (OP Depression)  Disciplines:  Interdisciplinary, PROVIDER    Expected end:  08/28/23       ProgressTowards Goals: Progressing  Interventions: CBT  Summary: Tracey Beard is a 42 y.o. female who presents with MDD, recurrent, moderate.   Suicidal/Homicidal: Nowithout intent/plan  Therapist Response: Tracey Beard engaged well in individual session in office. Clinician utilized CBT to process updates in thoughts, feelings, behaviors, and interactions. Tracey Beard processed ongoing challenges with her mood and thoughts, reporting a couple of really rough days last week. Clinician explored triggers to these rough days and noted some challenging interactions with men in her support system. Clinician reflected the urge to become distracted by "shiny objects" when trying to focus on a goal. Clinician encouraged Tracey Beard to recognize the difference between someone who is a partner in her progress and someone who is a distraction or a roadblock for her. Tracey Beard shared that a wonderful girlfriend has returned to her life this week, which has been very helpful and motivating. Clinician identified the value of good same-sex friends who can be supportive and also can provided honest feedback about what is going on.  Tracey Beard reports she has been more productive this week in terms of planning her life. She also shared that she has been sleeping much better at night.   Plan: Return again in 1 weeks.  Diagnosis: MDD (major  depressive disorder), recurrent episode, moderate (HCC)  Collaboration of Care: Psychiatrist AEB provided update to Dr. Donell Beers  Patient/Guardian was advised Release of Information must be obtained prior to any record release in order to collaborate their care with an outside provider. Patient/Guardian was advised if they have not already done so to contact the registration department to sign all necessary forms in order for [place] to release information regarding their care.   Consent: Patient/Guardian gives verbal consent for treatment and assignment of benefits for services provided during this visit. Patient/Guardian expressed understanding and agreed to proceed.   [name] [place], LCSW 05/09/2023

## 2023-05-10 ENCOUNTER — Encounter: Payer: Self-pay | Admitting: Internal Medicine

## 2023-05-15 ENCOUNTER — Ambulatory Visit (INDEPENDENT_AMBULATORY_CARE_PROVIDER_SITE_OTHER): Payer: No Typology Code available for payment source | Admitting: Licensed Clinical Social Worker

## 2023-05-15 ENCOUNTER — Encounter (HOSPITAL_COMMUNITY): Payer: Self-pay | Admitting: Licensed Clinical Social Worker

## 2023-05-15 DIAGNOSIS — F331 Major depressive disorder, recurrent, moderate: Secondary | ICD-10-CM | POA: Diagnosis not present

## 2023-05-15 NOTE — Progress Notes (Signed)
   THERAPIST PROGRESS NOTE  Session Time: 10:00am-10:55am  Participation Level: Active  Behavioral Response: Well GroomedAlertEuthymic  Type of Therapy: Individual Therapy  Treatment Goals addressed:  LTG: Reduce frequency, intensity, and duration of depression symptoms so that daily functioning is improved (OP Depression)  Disciplines:  Interdisciplinary, PROVIDER    Expected end:  08/28/23          LTG: Increase coping skills to manage depression and improve ability to perform daily activities (OP Depression)  Disciplines:  Interdisciplinary, PROVIDER    Expected end:  08/28/23       ProgressTowards Goals: Progressing  Interventions: CBT  Summary: Tracey Beard is a 42 y.o. female who presents with MDD, recurrent, moderate.   Suicidal/Homicidal: Nowithout intent/plan  Therapist Response: Tracey Beard engaged well in individual session with clinician. Clinician utilized CBT to process thoughts, feelings, and emotions. Clinician discussed relationships with family and friends. Tracey Beard shared ongoing increase in positive attention from her husband, which makes her feel uncomfortable. [name] also reports she has been reaching out and reconnecting with some old friends both local and in her home state. Clinician encouraged maintaining these positive relationships, but being aware of possible drama with past boyfriends. Clinician explored the intention [name] has for her future, processed past attempt at separation and divorce, and her feelings about becoming more independent.   Plan: Return again in 1-2 weeks.  Diagnosis: MDD (major depressive disorder), recurrent episode, moderate (HCC)  Collaboration of Care: Patient refused AEB none required  Patient/Guardian was advised Release of Information must be obtained prior to any record release in order to collaborate their care with an outside provider. Patient/Guardian was advised if they have not already done so to contact the  registration department to sign all necessary forms in order for [place] to release information regarding their care.   Consent: Patient/Guardian gives verbal consent for treatment and assignment of benefits for services provided during this visit. Patient/Guardian expressed understanding and agreed to proceed.   [name] [place], LCSW 05/15/2023

## 2023-05-16 ENCOUNTER — Encounter (HOSPITAL_COMMUNITY): Payer: Self-pay | Admitting: Licensed Clinical Social Worker

## 2023-05-16 NOTE — Progress Notes (Signed)
   THERAPIST PROGRESS NOTE  Session Time: 9:00am-9:55am  Participation Level: Active  Behavioral Response: Well GroomedAlertDepressed  Type of Therapy: Individual Therapy  Treatment Goals addressed:  LTG: Reduce frequency, intensity, and duration of depression symptoms so that daily functioning is improved (OP Depression)  Disciplines:  Interdisciplinary, PROVIDER    Expected end:  08/28/23          LTG: Increase coping skills to manage depression and improve ability to perform daily activities (OP Depression)  Disciplines:  Interdisciplinary, PROVIDER    Expected end:  08/28/23       ProgressTowards Goals: Progressing  Interventions: CBT  Summary: Tracey Beard is a 42 y.o. female who presents with MDD, moderate, recurrent.   Suicidal/Homicidal: Nowithout intent/plan  Therapist Response: Tracey Beard engaged well in individual session with clinician. Clinician utilized CBT to process thoughts, feelings, and interactions since last week. Clinician explored mood and noted that there were a couple of depressed days last week. Clinician normalized ups and downs while going through treatment and coping with daily life. Clinician explored the levels of depression, any thoughts of SI, and how this impacted her ability to complete her daily tasks. Tracey Beard shared that she still was able to care for her son and complete tasks at home, but she spent much of her time lying down, not wanting to get up. Clinician explored current status and noted improvement. Clinician processed interactions with husband, noting that he continues to be more attentive, communicative, and even took her out to the movies. Clinician reflected the discomfort and distrust she feels for him. Tracey Beard shared she is doing more socialization with friends, which has been very healthy for her. She did share concerns about sleep and strange dreams.   Plan: Return again in 1 weeks.  Diagnosis: MDD (major depressive disorder),  recurrent episode, moderate (HCC)  Collaboration of Care: Patient refused AEB none required  Patient/Guardian was advised Release of Information must be obtained prior to any record release in order to collaborate their care with an outside provider. Patient/Guardian was advised if they have not already done so to contact the registration department to sign all necessary forms in order for [place] to release information regarding their care.   Consent: Patient/Guardian gives verbal consent for treatment and assignment of benefits for services provided during this visit. Patient/Guardian expressed understanding and agreed to proceed.   [name] [place], LCSW 05/16/2023

## 2023-05-22 ENCOUNTER — Other Ambulatory Visit: Payer: Self-pay | Admitting: Internal Medicine

## 2023-05-22 DIAGNOSIS — I10 Essential (primary) hypertension: Secondary | ICD-10-CM

## 2023-05-24 ENCOUNTER — Encounter (HOSPITAL_COMMUNITY): Payer: Self-pay | Admitting: Licensed Clinical Social Worker

## 2023-05-24 ENCOUNTER — Ambulatory Visit (HOSPITAL_COMMUNITY): Payer: No Typology Code available for payment source | Admitting: Licensed Clinical Social Worker

## 2023-05-24 DIAGNOSIS — F331 Major depressive disorder, recurrent, moderate: Secondary | ICD-10-CM | POA: Diagnosis not present

## 2023-05-24 NOTE — Progress Notes (Signed)
Virtual Visit via Video Note  I connected with Tracey Beard on 05/24/23 at  4:30 PM EDT by a video enabled telemedicine application and verified that I am speaking with the correct person using two identifiers.  Location: Patient: home Provider: home office   I discussed the limitations of evaluation and management by telemedicine and the availability of in person appointments. The patient expressed understanding and agreed to proceed.   I discussed the assessment and treatment plan with the patient. The patient was provided an opportunity to ask questions and all were answered. The patient agreed with the plan and demonstrated an understanding of the instructions.   The patient was advised to call back or seek an in-person evaluation if the symptoms worsen or if the condition fails to improve as anticipated.  I provided 60 minutes of non-face-to-face time during this encounter.   Tracey Melter, LCSW   THERAPIST PROGRESS NOTE  Session Time: 4:30pm-5:30pm  Participation Level: Active  Behavioral Response: CasualAlertAnxious and Depressed  Type of Therapy: Individual Therapy  Treatment Goals addressed:  LTG: Reduce frequency, intensity, and duration of depression symptoms so that daily functioning is improved (OP Depression)  Disciplines:  Interdisciplinary, PROVIDER    Expected end:  08/28/23          LTG: Increase coping skills to manage depression and improve ability to perform daily activities (OP Depression)  Disciplines:  Interdisciplinary, PROVIDER    Expected end:  08/28/23         ProgressTowards Goals: Progressing  Interventions: CBT  Summary: Tracey Beard is a 42 y.o. female who presents with MDD, recurrent, moderate.   Suicidal/Homicidal: Nowithout intent/plan  Therapist Response: Tracey Beard engaged well in virtual session with Facilities manager. Clinician utilized CBT to process thoughts, feelings, and behaviors. Tracey Beard reported that the past 3 days have  been rough on her, increased depressed mood, lack of energy, increased depression, not wanting to get up or get dressed. Clinician utilized CBT to explore the trigger to this depressed mood. Tracey Beard shared that she had restarted a friendship with a woman whom she had not spoken to for 9 years. She reported the intense level of need , both physically and psychologically, and that the stress of this relationship was really impacting her. Clinician discussed boundary setting, introduced the "compliment sandwich", and provided a "script" for breaking from the relationship. Clinician discussed the short and long term impact of boundaries, noting that the other person will not be happy, but that after some time, Tracey Beard will feel more empowered and more at ease. Clinician validated the concern that this has been a backslide in her treatment. Clinician reassured Tracey Beard that this is actually a signal that treatment is working, since she brought the issue to therapy to help with the situation.  Clinician reflected the avoidance of the grief topic. Clinician provided psychoeducation about the impact of retelling the traumatic grief Tracey Beard, which makes it more manageable over time. Tracey Beard agreed that she will tell the Tracey Beard again at an in person session.  Clinician discussed option for going to IOP for the first 2 weeks of October while clinician is out on medical leave.   Plan: Return again in 1-2 weeks.  Diagnosis: MDD (major depressive disorder), recurrent episode, moderate (HCC)  Collaboration of Care: Referral or follow-up with counselor/therapist AEB will refer to IOP for the first 2 weeks of October.  Patient/Guardian was advised Release of Information must be obtained prior to any record release in order to collaborate their care with an  outside provider. Patient/Guardian was advised if they have not already done so to contact the registration department to sign all necessary forms in order for [place] to release  information regarding their care.   Consent: Patient/Guardian gives verbal consent for treatment and assignment of benefits for services provided during this visit. Patient/Guardian expressed understanding and agreed to proceed.   Tracey Heck [place], LCSW 05/24/2023

## 2023-05-29 ENCOUNTER — Ambulatory Visit (INDEPENDENT_AMBULATORY_CARE_PROVIDER_SITE_OTHER): Payer: No Typology Code available for payment source | Admitting: Licensed Clinical Social Worker

## 2023-05-29 ENCOUNTER — Encounter (HOSPITAL_COMMUNITY): Payer: Self-pay | Admitting: Licensed Clinical Social Worker

## 2023-05-29 DIAGNOSIS — F4310 Post-traumatic stress disorder, unspecified: Secondary | ICD-10-CM

## 2023-05-29 DIAGNOSIS — F331 Major depressive disorder, recurrent, moderate: Secondary | ICD-10-CM | POA: Diagnosis not present

## 2023-05-29 NOTE — Progress Notes (Signed)
Comprehensive Clinical Assessment (CCA) Note  05/29/2023 Tracey Beard [id number]   CCA Biopsychosocial Intake/Chief Complaint:  Grieving the loss of stillborn daughter, 2 weeks later mother passed, aunt one month later, and an aunt that died later that year 06-11-2017). 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 Abused meds for back pain. Drank alcohol to numb herself- was drinking daily. felt suicidal- voluntary committment at [facility]. Husband was not supportive. HAs not had a good marriage for over a decade. Filed for divorce a few years ago, but could not financially support herself. Still under the same roof, but very draining, feels like a prison. Having panic attacks, but in the past month they have started again. Sleep problems- went to specialist who referred to mental health.  Current Symptoms/Problems: panic attacks, husband is narcissistic.   Patient Reported Schizophrenia/Schizoaffective Diagnosis in Past: No   Strengths: devoted and dedicated mother, will do anything she can for her baby. She is a Engineer, civil (consulting).  Preferences: independence  Abilities: very good with art, good nurse,   Type of Services Patient Feels are Needed: OPT, meds with Dr. Donell Beers   Initial Clinical Notes/Concerns: No data recorded  Mental Health Symptoms Depression:   Change in energy/activity; Hopelessness; Sleep (too much or little)   Duration of Depressive symptoms:  Greater than two weeks   Mania:   None   Anxiety:    Tension; Sleep; Worrying; Restlessness (panic attacks- 2 in the past month)   Psychosis:   None   Duration of Psychotic symptoms: No data recorded  Trauma:   Avoids reminders of event; Detachment from others; Difficulty staying/falling asleep; Emotional numbing; Guilt/shame; Hypervigilance; Irritability/anger; Re-experience of traumatic event   Obsessions:   None   Compulsions:   None   Inattention:   None   Hyperactivity/Impulsivity:   None   Oppositional/Defiant Behaviors:    None   Emotional Irregularity:   Chronic feelings of emptiness; Intense/unstable relationships; Unstable self-image   Other Mood/Personality Symptoms:  No data recorded   Mental Status Exam Appearance and self-care  Stature:   Average   Weight:   Overweight   Clothing:   Neat/clean   Grooming:   Well-groomed   Cosmetic use:   Age appropriate   Posture/gait:   Normal   Motor activity:   Slowed   Sensorium  Attention:   Normal   Concentration:   Anxiety interferes   Orientation:   X5   Recall/memory:   Normal   Affect and Mood  Affect:   Blunted; Depressed   Mood:   Depressed   Relating  Eye contact:   Normal   Facial expression:   Responsive   Attitude toward examiner:   Cooperative   Thought and Language  Speech flow:  Clear and Coherent   Thought content:   Appropriate to Mood and Circumstances   Preoccupation:   None   Hallucinations:   None   Organization:  No data recorded  [employer] of Knowledge:   Good   Intelligence:   Above Average   Abstraction:   Normal   Judgement:   Normal   Reality Testing:   Realistic   Insight:   Good   Decision Making:   Normal   Social Functioning  Social Maturity:   Responsible   Social Judgement:   Normal   Stress  Stressors:   Grief/losses; Relationship; Work   Coping Ability:   Contractor Deficits:  No data recorded  Supports:   Support needed  Religion:  unaffiliated  Leisure/Recreation:  Walking, going to the [employer], spending time with son   Exercise/Diet:  walking   CCA Employment/Education Employment/Work Situation:  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 She died in 2017/06/13. Father has been estranged for most of her life. Has a phone relationship.   Childhood History:   Raised by mother. No early  childhood trauma  Child/Adolescent Assessment:  NA   CCA Substance Use Alcohol/Drug Use: 06/13/17 went to [facility] for detox and BH stabilization following stillbirth and mother's death.                            ASAM's:  Six Dimensions of Multidimensional Assessment  Dimension 1:  Acute Intoxication and/or Withdrawal Potential:      Dimension 2:  Biomedical Conditions and Complications:      Dimension 3:  Emotional, Behavioral, or Cognitive Conditions and Complications:     Dimension 4:  Readiness to Change:     Dimension 5:  Relapse, Continued use, or Continued Problem Potential:     Dimension 6:  Recovery/Living Environment:     ASAM Severity Score:    ASAM Recommended Level of Treatment:     Substance use Disorder (SUD)    Recommendations for Services/Supports/Treatments:  OPT, IOP, Medication management  DSM5 Diagnoses: Patient Active Problem List   Diagnosis Date Noted   Hyperkalemia 04/19/2023   Mental health-related complaint 02/06/2023   Fatigue due to depression 02/06/2023   DM (diabetes mellitus), type 2 (HCC) 01/15/2023   Vitamin D deficiency 01/15/2023   Hyperlipidemia associated with type 2 diabetes mellitus (HCC) 01/15/2023   Amnesia 05/15/2022   Hypertensive disorder 05/15/2022   Right tibial fracture 05/15/2022   Periumbilical abdominal pain 03/24/2021   Altered bowel habits 03/24/2021   Gestational hypertension 01/29/2019   S/P cesarean section 01/29/2019   Abnormal glucose tolerance test (GTT) during pregnancy, antepartum 12/06/2018   Adjustment disorder with depressed mood 09/05/2017   Gastroparesis 07/25/2017   Nausea and vomiting 06/18/2017   Generalized abdominal pain 06/18/2017   Diarrhea 06/18/2017   Sacroiliitis (HCC) 04/07/2017   Grief reaction 01/06/2017   IUFD at 20 weeks or more of gestation 11/01/2016   Borderline diabetes mellitus 08/25/2015   Papillary thyroid carcinoma (HCC) 08/14/2015   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/2015    Transaminitis 01/13/2015   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   Pseudotumor cerebri 07/11/2014   Obesity 07/11/2014   Chronic back pain 07/11/2014   Elevated blood-pressure reading without diagnosis of hypertension 05/27/2014   Dizziness 03/05/2014   GAD (generalized anxiety disorder) 12/05/2013   Back ache 12/05/2013   Anxiety and depression 12/05/2013   Generalized hyperhidrosis 12/05/2013   Chronic insomnia 12/05/2013   Family history of breast cancer 12/05/2013    Patient Centered Plan: Patient is on the following Treatment Plan(s):  Post Traumatic Stress Disorder   Referrals to Alternative Service(s): Referred to Alternative Service(s):   Place:   Date:   Time:    Referred to Alternative Service(s):   Place:   Date:   Time:    Referred to Alternative Service(s):   Place:   Date:   Time:    Referred to Alternative Service(s):   Place:   Date:   Time:      Collaboration of Care: Psychiatrist AEB consulted with Dr. Donell Beers  Patient/Guardian was advised Release of  Information must be obtained prior to any record release in order to collaborate their care with an outside provider. Patient/Guardian was advised if they have not already done so to contact the registration department to sign all necessary forms in order for [place] to release information regarding their care.   Consent: Patient/Guardian gives verbal consent for treatment and assignment of benefits for services provided during this visit. Patient/Guardian expressed understanding and agreed to proceed.   Veneda Melter, LCSW

## 2023-05-29 NOTE — Progress Notes (Signed)
   THERAPIST PROGRESS NOTE  Session Time: 9:00am-9:55am  Participation Level: Active  Behavioral Response: Well GroomedAlertEuthymic  Type of Therapy: Individual Therapy  Treatment Goals addressed:  LTG: Reduce frequency, intensity, and duration of depression symptoms so that daily functioning is improved (OP Depression)  Disciplines:  Interdisciplinary, PROVIDER    Expected end:  08/28/23          LTG: Increase coping skills to manage depression and improve ability to perform daily activities (OP Depression)  Disciplines:  Interdisciplinary, PROVIDER    Expected end:  08/28/23         ProgressTowards Goals: Progressing  Interventions: CBT  Summary: Amaal Despres is a 42 y.o. female who presents with MDD, recurrent, moderate,  PTSD.   Suicidal/Homicidal: Nowithout intent/plan  Therapist Response: Teaghen engaged well in session with clinician. Clinician utilized CBT in session to process thoughts, feelings, and behaviors. Clinician discussed updates on interactions with friends and decisions to back away from some friends that were too intense for what Earlena is currently going through. Clinician provided CBT psychoeducation and reviewed the list of classic cognitive distortions. Aden identified several cognitive distortions that resonate with her, noting that she has been noticing herself thinking things like this recently and she is starting to question her thoughts. Clinician discussed the goal of "catch, challenge, and change". Clinician also provided S.T.O.P as a way to be more attentive to her thought processes and actions.  Clinician discussed plan for Lakashia to go to IOP for the 2 weeks while clinician in on leave in October. Clinician submitted referral to IOP clinicians.   Plan: Return again in 1 weeks.   Diagnosis: MDD (major depressive disorder), recurrent episode, moderate (HCC)  PTSD (post-traumatic stress disorder)  Collaboration of Care: Psychiatrist AEB  notified Dr. Donell Beers of plan to do IOP in October. He agreed with this plan.  Patient/Guardian was advised Release of Information must be obtained prior to any record release in order to collaborate their care with an outside provider. Patient/Guardian was advised if they have not already done so to contact the registration department to sign all necessary forms in order for [place] to release information regarding their care.   Consent: Patient/Guardian gives verbal consent for treatment and assignment of benefits for services provided during this visit. Patient/Guardian expressed understanding and agreed to proceed.   [name] [place], LCSW 05/29/2023

## 2023-06-05 ENCOUNTER — Ambulatory Visit (INDEPENDENT_AMBULATORY_CARE_PROVIDER_SITE_OTHER): Payer: No Typology Code available for payment source | Admitting: Licensed Clinical Social Worker

## 2023-06-05 ENCOUNTER — Encounter (HOSPITAL_COMMUNITY): Payer: Self-pay | Admitting: Licensed Clinical Social Worker

## 2023-06-05 DIAGNOSIS — F331 Major depressive disorder, recurrent, moderate: Secondary | ICD-10-CM | POA: Diagnosis not present

## 2023-06-05 NOTE — Progress Notes (Signed)
   THERAPIST PROGRESS NOTE  Session Time: 2:30pm-3:30pm  Participation Level: Active  Behavioral Response: Well GroomedAlertEuthymic  Type of Therapy: Individual Therapy  Treatment Goals addressed:  LTG: Reduce frequency, intensity, and duration of depression symptoms so that daily functioning is improved (OP Depression)  Disciplines:  Interdisciplinary, PROVIDER    Expected end:  08/28/23          LTG: Increase coping skills to manage depression and improve ability to perform daily activities (OP Depression)  Disciplines:  Interdisciplinary, PROVIDER    Expected end:  08/28/23       ProgressTowards Goals: Progressing  Interventions: CBT  Summary: Tracey Beard is a 42 y.o. female who presents with MDD, moderate, recurrent.   Suicidal/Homicidal: Nowithout intent/plan  Therapist Response: Tracey Beard engaged well in individual session with clinician. Clinician utilized CBT to process thoughts, feelings, and behaviors. Clinician explored updates since last session and explored the impact of paying attention to automatic thoughts discussed last session. Clinician noted improvement in catching herself making those automatic thoughts, as well as some improvement in self-challenging. Clinician discussed concern that Tracey Beard is becoming more "cold-hearted" because she is setting boundaries. Clinician addressed this concern and explored Tracey Beard's view of self-care. Clinician discussed the kind of boundaries she wants to set and noted that she can choose how rigid or flexible she wants to be with those boundaries.  Clinician noted ongoing challenges with coping with grief. Clinician also noted the fear that if that treatment begins, she may not be able to avoid another depressive episode. Clinician validated that concern and also noted that this was not enough of a reason to not do the work, as the outcomes will be worth it.   Plan: Return again in 1 weeks.  Diagnosis: MDD (major depressive  disorder), recurrent episode, moderate (HCC)  Collaboration of Care: Patient refused AEB none required  Patient/Guardian was advised Release of Information must be obtained prior to any record release in order to collaborate their care with an outside provider. Patient/Guardian was advised if they have not already done so to contact the registration department to sign all necessary forms in order for [place] to release information regarding their care.   Consent: Patient/Guardian gives verbal consent for treatment and assignment of benefits for services provided during this visit. Patient/Guardian expressed understanding and agreed to proceed.   [name] [place], LCSW 06/05/2023

## 2023-06-13 ENCOUNTER — Ambulatory Visit (INDEPENDENT_AMBULATORY_CARE_PROVIDER_SITE_OTHER): Payer: No Typology Code available for payment source | Admitting: Licensed Clinical Social Worker

## 2023-06-13 ENCOUNTER — Telehealth (HOSPITAL_COMMUNITY): Payer: Self-pay | Admitting: Psychiatry

## 2023-06-13 DIAGNOSIS — F331 Major depressive disorder, recurrent, moderate: Secondary | ICD-10-CM

## 2023-06-13 NOTE — Telephone Encounter (Signed)
D:  Alvera Singh, LCSW referred pt to virtual MH-IOP.  A:  Placed call and oriented pt.  Pt states she would like to verify her insurance benefits before making a decision.  Encouraged pt to call the case manager after she speaks to her insurance company.  Inform Jessica.  R:  Pt receptive.

## 2023-06-17 ENCOUNTER — Encounter (HOSPITAL_COMMUNITY): Payer: Self-pay | Admitting: Licensed Clinical Social Worker

## 2023-06-17 NOTE — Progress Notes (Signed)
Virtual Visit via Video Note  I connected with Zonie Crutcher on 06/13/23 at  9:00 AM EDT by a video enabled telemedicine application and verified that I am speaking with the correct person using two identifiers.  Location: Patient: home Provider: home office   I discussed the limitations of evaluation and management by telemedicine and the availability of in person appointments. The patient expressed understanding and agreed to proceed.   I discussed the assessment and treatment plan with the patient. The patient was provided an opportunity to ask questions and all were answered. The patient agreed with the plan and demonstrated an understanding of the instructions.   The patient was advised to call back or seek an in-person evaluation if the symptoms worsen or if the condition fails to improve as anticipated.  I provided 55 minutes of non-face-to-face time during this encounter.   Veneda Melter, LCSW   THERAPIST PROGRESS NOTE  Session Time: 9:00am-9:55am  Participation Level: Active  Behavioral Response: CasualAlertDepressed  Type of Therapy: Individual Therapy  Treatment Goals addressed:  LTG: Reduce frequency, intensity, and duration of depression symptoms so that daily functioning is improved (OP Depression)  Disciplines:  Interdisciplinary, PROVIDER    Expected end:  08/28/23          LTG: Increase coping skills to manage depression and improve ability to perform daily activities (OP Depression)  Disciplines:  Interdisciplinary, PROVIDER    Expected end:  08/28/23         ProgressTowards Goals: Progressing  Interventions: CBT  Summary: Jackalyn Haith is a 42 y.o. female who presents with MDD, recurrent, moderate.   Suicidal/Homicidal: Nowithout intent/plan  Therapist Response: Nicol engaged well in individual virtual session with Facilities manager. Clinician utilized CBT to process thoughts, feelings, and interactions since last session. Clinician discussed  relationships with friends, husband, and child. Clinician processed the ongoing impact of grief on Huyen's daily life, including missing her mother, thinking about relationships with father and brother, and hx of rejection by father and step-mother. Clinician processed the impact of this on Jailine's adult life. Clinician also identified the closeness that was forged with mother since father was unavailable to her in childhood. Clinician discussed ways to incorporate memories and love of mother into her storytelling with son, carrying on her memories, and being able to maintain the closeness they shared.   Plan: Return again in 4 weeks. Clinician referred to MH-IOP until clinician returns from medical leave  Diagnosis: MDD (major depressive disorder), recurrent episode, moderate (HCC)  Collaboration of Care: Referral or follow-up with counselor/therapist AEB referral to IOP  Patient/Guardian was advised Release of Information must be obtained prior to any record release in order to collaborate their care with an outside provider. Patient/Guardian was advised if they have not already done so to contact the registration department to sign all necessary forms in order for [place] to release information regarding their care.   Consent: Patient/Guardian gives verbal consent for treatment and assignment of benefits for services provided during this visit. Patient/Guardian expressed understanding and agreed to proceed.   [name] [place], LCSW 06/17/2023

## 2023-06-19 ENCOUNTER — Ambulatory Visit: Payer: No Typology Code available for payment source | Admitting: Family Medicine

## 2023-06-19 ENCOUNTER — Ambulatory Visit: Payer: No Typology Code available for payment source | Admitting: Internal Medicine

## 2023-06-20 ENCOUNTER — Ambulatory Visit: Payer: No Typology Code available for payment source | Admitting: Family Medicine

## 2023-06-28 ENCOUNTER — Telehealth (HOSPITAL_COMMUNITY): Payer: Self-pay | Admitting: Licensed Clinical Social Worker

## 2023-06-28 NOTE — Telephone Encounter (Signed)
Patient was called to cancel her 10/15 due to Shanda Bumps being out of the office. Patient stated she does not know what to do and can not go on this long without seeing someone. Patient was advised to go to ED or urgent care if she feels like she is having a crisis and needs help. Patient verbal expressed understanding. Patient is on waitlist for an earlier appointment.

## 2023-07-02 ENCOUNTER — Other Ambulatory Visit: Payer: Self-pay | Admitting: Internal Medicine

## 2023-07-02 DIAGNOSIS — R5383 Other fatigue: Secondary | ICD-10-CM

## 2023-07-03 ENCOUNTER — Ambulatory Visit (HOSPITAL_COMMUNITY): Payer: No Typology Code available for payment source | Admitting: Licensed Clinical Social Worker

## 2023-07-10 ENCOUNTER — Ambulatory Visit (HOSPITAL_BASED_OUTPATIENT_CLINIC_OR_DEPARTMENT_OTHER): Payer: No Typology Code available for payment source | Admitting: Psychiatry

## 2023-07-10 ENCOUNTER — Encounter (HOSPITAL_COMMUNITY): Payer: Self-pay | Admitting: Psychiatry

## 2023-07-10 VITALS — BP 138/84 | HR 94 | Resp 18 | Ht 61.0 in | Wt 184.2 lb

## 2023-07-10 DIAGNOSIS — F324 Major depressive disorder, single episode, in partial remission: Secondary | ICD-10-CM | POA: Diagnosis not present

## 2023-07-10 MED ORDER — TEMAZEPAM 15 MG PO CAPS: ORAL_CAPSULE | ORAL | 4 refills | Status: DC

## 2023-07-10 MED ORDER — BUPROPION HCL ER (XL) 300 MG PO TB24: ORAL_TABLET | ORAL | 2 refills | Status: DC

## 2023-07-10 MED ORDER — BUSPIRONE HCL 5 MG PO TABS: ORAL_TABLET | ORAL | 1 refills | Status: DC

## 2023-07-10 NOTE — Progress Notes (Signed)
Psychiatric Initial Adult Assessment   Patient Identification: Tracey Beard MRN:  [id number] Date of Evaluation:  07/10/2023 Referral Source: Dr. Porfirio Mylar Dohmeier Chief Complaint: Depression Visit Diagnosis:   History of Present Illness:     Today the patient is seen in the office.  She is doing fairly well.  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  She knows of no evidence to explain why they would think of such a thing.  She says the room that she works in is on the counter all the time and she never ever took any medications from the system.  The court issue has been going on for over a year.  She is obviously very anxious about it.  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  She no longer has any interest in the medical field.  She feels like she is being accused falsely.  She is working hard to get legal representation.  She is paid a lawyer who now lives in [place] and she is having difficulty affording them.  The patient fortunately seems to be fairly resilient.  Her mood is good.  She denies anxiety.  She is sleeping and eating well.  The patient works out.  She meditates.  She likes to read.  She watches TV.  She enjoys the most is her 32-year-old son whose name is Museum/gallery curator.  The patient takes her medicines just as prescribed.  She says the medicines have been very helpful.  She does have some moderate anxiety but is directly related to her court case.  It has been put off multiple times by the system.  Her husband seems to be relatively supportive for her although financially he does not seem to be supportive.  The patient's diagnosis is major depression with insomnia.  She is not suicidal.  She is not psychotic.  She absolutely denies the use of alcohol or illicit substances. Associated Signs/Symptoms: Depression Symptoms:   (Hypo) Manic Symptoms:   Anxiety Symptoms:   Psychotic  Symptoms:   PTSD Symptoms: NA  Past Psychiatric History:   Previous Psychotropic Medications: Yes   Substance Abuse History in the last 12 months:  Yes.    Consequences of Substance Abuse:   Past Medical History:  Past Medical History:  Diagnosis Date   Anxiety    Chronic back pain    Depression    Gestational diabetes    glyburide   Headache    Hypertension    Idiopathic intracranial hypertension    Leukocytosis 02/25/2015   Obesity    Pregnancy induced hypertension    labetalol    Past Surgical History:  Procedure Laterality Date   CESAREAN SECTION N/A 01/29/2019   Procedure: CESAREAN SECTION;  Surgeon: Mitchel Honour, DO;  Location: [facility];  Service: Obstetrics;  Laterality: N/A;   CHOLECYSTECTOMY     LUMBAR PUNCTURE  2014   5x    Family Psychiatric History:   Family History:  Family History  Problem Relation Age of Onset   Hypertension Mother    Breast cancer Mother    Pancreatic cancer Mother    Diabetes Mother    Hypertension Father    Hypertension Maternal Grandmother    Heart disease Maternal Grandmother    Hypertension Maternal Grandfather    Diabetes Maternal Grandfather    Colon cancer Paternal Grandmother    Heart disease Paternal Grandfather  Hypertension Maternal Aunt    Breast cancer Maternal Aunt    Diabetes Maternal Aunt    Hypertension Maternal Uncle    Liver cancer Maternal Uncle    Prostate cancer Maternal Uncle    Miscarriages / [place] Daughter     Social History:   Social History   Socioeconomic History   Marital status: Married    Spouse name: Not on file   Number of children: 2   Years of education: Not on file   Highest education level: Associate degree: occupational, Scientist, product/process development, or vocational program  Occupational History   Occupation: Nurse  Tobacco Use   Smoking status: Never   Smokeless tobacco: Never  Vaping Use   Vaping status: Never Used  Substance and Sexual Activity   Alcohol use: Yes    Comment:  socially   Drug use: No   Sexual activity: Not Currently    Birth control/protection: None  Other Topics Concern   Not on file  Social History Narrative   Not on file   Social Determinants of Health   Financial Resource Strain: Low Risk  (02/20/2023)   Overall Financial Resource Strain (CARDIA)    Difficulty of Paying Living Expenses: Not very hard  Food Insecurity: No Food Insecurity (02/20/2023)   Hunger Vital Sign    Worried About Running Out of Food in the Last Year: Never true    Ran Out of Food in the Last Year: Never true  Transportation Needs: No Transportation Needs (02/20/2023)   PRAPARE - Administrator, Civil Service (Medical): No    Lack of Transportation (Non-Medical): No  Physical Activity: Insufficiently Active (02/20/2023)   Exercise Vital Sign    Days of Exercise per Week: 3 days    Minutes of Exercise per Session: 30 min  Stress: Stress Concern Present (02/20/2023)   [employer] of Occupational Health - Occupational Stress Questionnaire    Feeling of Stress : Very much  Social Connections: Unknown (02/20/2023)   Social Connection and Isolation Panel [NHANES]    Frequency of Communication with Friends and Family: Patient declined    Frequency of Social Gatherings with Friends and Family: Never    Attends Religious Services: Patient declined    Database administrator or Organizations: No    Attends Engineer, structural: Not on file    Marital Status: Married    Additional Social History:   Allergies:   Allergies  Allergen Reactions   Elavil [Amitriptyline Hcl] Shortness Of Breath   Restoril [Temazepam] Shortness Of Breath   Trazodone And Nefazodone Anaphylaxis   Diamox [Acetazolamide] Other (See Comments)    Cold chills/loss of taste    Tomato Swelling and Other (See Comments)    Causes lips to swell   Maxalt [Rizatriptan Benzoate] Other (See Comments)    Chest tightness and respiratory distress   Norco [Hydrocodone-Acetaminophen]  Itching   Pollen Extract Other (See Comments)    Unknown reaction    Benzocaine Swelling, Rash and Other (See Comments)    Localized swelling   Latex Rash   Novocain [Procaine] Swelling, Rash and Other (See Comments)    Localized swelling    Metabolic Disorder Labs: Lab Results  Component Value Date   HGBA1C 6.7 (A) 04/19/2023   MPG 122.63 05/15/2022   No results found for: "PROLACTIN" Lab Results  Component Value Date   CHOL 137 04/19/2023   TRIG 123.0 04/19/2023   HDL 58.20 04/19/2023   CHOLHDL 2 04/19/2023   VLDL 24.6  04/19/2023   LDLCALC 54 04/19/2023   LDLCALC 84 01/10/2023   Lab Results  Component Value Date   TSH 1.28 01/10/2023    Therapeutic Level Labs: No results found for: "LITHIUM" No results found for: "CBMZ" No results found for: "VALPROATE"  Current Medications: Current Outpatient Medications  Medication Sig Dispense Refill   amLODipine (NORVASC) 5 MG tablet Take 1 tablet (5 mg total) by mouth daily. 90 tablet 1   esomeprazole (NEXIUM) 40 MG capsule Take 1 capsule (40 mg total) by mouth 2 (two) times daily before a meal. 60 capsule 5   atorvastatin (LIPITOR) 20 MG tablet TAKE ONE TABLET BY MOUTH EVERY DAY 90 tablet 1   buPROPion (WELLBUTRIN XL) 300 MG 24 hr tablet 1 qam  for 1 week then 2 qam 90 tablet 2   busPIRone (BUSPAR) 5 MG tablet 1 TID 270 tablet 1   Continuous Glucose Receiver (FREESTYLE LIBRE 3 READER) DEVI 1 each by Does not apply route See admin instructions. 2 each 3   Continuous Glucose Sensor (FREESTYLE LIBRE 3 SENSOR) MISC 1 each by Does not apply route See admin instructions. Place 1 sensor on the skin every 14 days. Use to check glucose continuously 2 each 3   ibuprofen (ADVIL) 800 MG tablet Take 1 tablet (800 mg total) by mouth every 6 (six) hours as needed for moderate pain. 30 tablet 0   ondansetron (ZOFRAN) 4 MG tablet Take 1 tablet (4 mg total) by mouth every 6 (six) hours. 12 tablet 0   OZEMPIC, 1 MG/DOSE, 4 MG/3ML SOPN Inject 1  mg as directed once a week. 3 mL 2   potassium chloride SA (KLOR-CON M) 20 MEQ tablet Take 1 tablet (20 mEq total) by mouth 2 (two) times daily for 5 days. 10 tablet 0   temazepam (RESTORIL) 15 MG capsule 2 qhs 60 capsule 4   tiZANidine (ZANAFLEX) 4 MG tablet TAKE ONE TABLET BY MOUTH THREE TIMES DAILY AS NEEDED muscle SPASMS 30 tablet 1   triamterene-hydrochlorothiazide (MAXZIDE) 75-50 MG tablet Take 1 tablet by mouth daily. 90 tablet 0   Vitamin D, Ergocalciferol, (DRISDOL) 1.25 MG (50000 UNIT) CAPS capsule Take 1 capsule (50,000 Units total) by mouth every 7 (seven) days. 12 capsule 0   No current facility-administered medications for this visit.    Musculoskeletal: Strength & Muscle Tone: within normal limits Gait & Station: normal Patient leans: N/A  Psychiatric Specialty Exam: Review of Systems  Blood pressure 138/84, pulse 94, resp. rate 18, height 5\' 1"  (1.549 m), weight 184 lb 3.2 oz (83.6 kg).Body mass index is 34.8 kg/m.  General Appearance: Casual  Eye Contact:  Good  Speech:  NA  Volume:  Normal  Mood:  Anxious  Affect:  Appropriate  Thought Process:  Coherent  Orientation:  Full (Time, Place, and Person)  Thought Content:  WDL  Suicidal Thoughts:  No  Homicidal Thoughts:  No  Memory:  NA  Judgement:  Good  Insight:  Good  Psychomotor Activity:  Normal  Concentration:    Recall:  NA  Fund of Knowledge:Good  Language: Good  Akathisia:  NA  Handed:  Left  AIMS (if indicated):  not done  Assets:  Desire for Improvement  ADL's:  Intact  Cognition: WNL  Sleep:  Poor   Screenings: GAD-7    Flowsheet Row Office Visit from 04/19/2023 in [facility] [place] Office Visit from 02/21/2023 in [facility] [place] Office Visit from 01/10/2023 in [facility]  Nature conservation officer at [employer]  Total GAD-7 Score 7 16 15       [employer]    Flowsheet Row Office Visit from 04/19/2023 in [facility] [place]  Office Visit from 02/21/2023 in [facility] [place] Office Visit from 01/10/2023 in [facility] [place] Nutrition from 12/04/2018 in [facility] Total Score 2 6 4 2   PHQ-9 Total Score 6 18 18  --      Flowsheet Row ED from 05/22/2022 in [facility] Department at [facility] ED to Hosp-Admission (Discharged) from 05/14/2022 in [facility]LO[facility] ED from 04/16/2021 in [facility] Emergency Department at [facility]  C-SSRS RISK CATEGORY No Risk No Risk No Risk       Assessment and Plan:     This 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  She continues taking Wellbutrin 300 mg.  We will give her a 90-day supply of this agent.  She also takes BuSpar.  However today we adjusted down the dose to 15 mg 3 times daily.  This is for an adjustment disorder with an anxious mood state.  Patient also continues in therapy in this office.  The patient's third problem is insomnia.  She takes Restoril 15 mg 2 pills at night.  Patient is doing fairly well.  She will return to see me in 2 to 3 months.  Collaboration of Care:   Patient/Guardian was advised Release of Information must be obtained prior to any record release in order to collaborate their care with an outside provider. Patient/Guardian was advised if they have not already done so to contact the registration department to sign all necessary forms in order for [place] to release information regarding their care.   Consent: Patient/Guardian gives verbal consent for treatment and assignment of benefits for services provided during this visit. Patient/Guardian expressed understanding and agreed to proceed.   Gypsy Balsam, MD 10/22/20241:39 PM

## 2023-07-11 ENCOUNTER — Ambulatory Visit (HOSPITAL_COMMUNITY): Payer: No Typology Code available for payment source | Admitting: Licensed Clinical Social Worker

## 2023-07-11 DIAGNOSIS — F331 Major depressive disorder, recurrent, moderate: Secondary | ICD-10-CM

## 2023-07-12 ENCOUNTER — Encounter (HOSPITAL_COMMUNITY): Payer: Self-pay | Admitting: Licensed Clinical Social Worker

## 2023-07-12 NOTE — Progress Notes (Signed)
Virtual Visit via Video Note  I connected with Kathyria Essien on 07/11/23 at  9:00 AM EDT by a video enabled telemedicine application and verified that I am speaking with the correct person using two identifiers.  Location: Patient: home Provider: home office   I discussed the limitations of evaluation and management by telemedicine and the availability of in person appointments. The patient expressed understanding and agreed to proceed.   I discussed the assessment and treatment plan with the patient. The patient was provided an opportunity to ask questions and all were answered. The patient agreed with the plan and demonstrated an understanding of the instructions.   The patient was advised to call back or seek an in-person evaluation if the symptoms worsen or if the condition fails to improve as anticipated.  I provided 55 minutes of non-face-to-face time during this encounter.   Veneda Melter, LCSW   THERAPIST PROGRESS NOTE  Session Time: 9:00am-9:55am  Participation Level: Active  Behavioral Response: CasualAlertDepressed and Irritable  Type of Therapy: Individual Therapy  Treatment Goals addressed:  LTG: Reduce frequency, intensity, and duration of depression symptoms so that daily functioning is improved (OP Depression)  Disciplines:  Interdisciplinary, PROVIDER    Expected end:  08/28/23          LTG: Increase coping skills to manage depression and improve ability to perform daily activities (OP Depression)  Disciplines:  Interdisciplinary, PROVIDER    Expected end:  08/28/23         ProgressTowards Goals: Progressing  Interventions: CBT  Summary: Adessa Cavness is a 42 y.o. female who presents with MDD, recurrent, moderate.   Suicidal/Homicidal: Nowithout intent/plan  Therapist Response: Rhett engaged well in individual virtual session with Facilities manager. Clinician utilized CBT to process thoughts, feelings, and behaviors. Clinician processed recent  challenges with friendships, particularly around Maddalyn's growth and focus on changing for the better. Brookes shared that some old friends are not as supportive of Georga's change and focus on healing as she hoped they would be. Clinician validated this experience and provided feedback about the challenges of changing when everyone else is staying the same. Clinician noted that for friends, her changes may reflect poorly on them or on her relationship to them. Clinician also noted that since she has been changing, she is less likely to put up with a lot of nonsense from these people. Clinician encouraged Tacoya to seek out new friendships, through volunteering, through preschool with son, or other activities. Clinician identified that, like in addiction treatment, you have to change people, places and things in order to get out of old habits.   Plan: Return again in 1 weeks.  Diagnosis: MDD (major depressive disorder), recurrent episode, moderate (HCC)  Collaboration of Care: Patient refused AEB none required. Feleica is doing very well in her progress  Patient/Guardian was advised Release of Information must be obtained prior to any record release in order to collaborate their care with an outside provider. Patient/Guardian was advised if they have not already done so to contact the registration department to sign all necessary forms in order for [place] to release information regarding their care.   Consent: Patient/Guardian gives verbal consent for treatment and assignment of benefits for services provided during this visit. Patient/Guardian expressed understanding and agreed to proceed.   [name] [place], LCSW 07/12/2023

## 2023-07-16 ENCOUNTER — Other Ambulatory Visit: Payer: Self-pay | Admitting: Internal Medicine

## 2023-07-16 DIAGNOSIS — E1169 Type 2 diabetes mellitus with other specified complication: Secondary | ICD-10-CM

## 2023-07-17 ENCOUNTER — Ambulatory Visit (INDEPENDENT_AMBULATORY_CARE_PROVIDER_SITE_OTHER): Payer: No Typology Code available for payment source | Admitting: Licensed Clinical Social Worker

## 2023-07-17 DIAGNOSIS — F331 Major depressive disorder, recurrent, moderate: Secondary | ICD-10-CM | POA: Diagnosis not present

## 2023-07-18 ENCOUNTER — Encounter (HOSPITAL_COMMUNITY): Payer: Self-pay | Admitting: Licensed Clinical Social Worker

## 2023-07-18 NOTE — Progress Notes (Signed)
   THERAPIST PROGRESS NOTE  Session Time: 9:00am-9:55am  Participation Level: Active  Behavioral Response: Well GroomedAlertEuthymic  Type of Therapy: Individual Therapy  Treatment Goals addressed:  LTG: Reduce frequency, intensity, and duration of depression symptoms so that daily functioning is improved (OP Depression)  Disciplines:  Interdisciplinary, PROVIDER    Expected end:  08/28/23          LTG: Increase coping skills to manage depression and improve ability to perform daily activities (OP Depression)  Disciplines:  Interdisciplinary, PROVIDER    Expected end:  08/28/23        ProgressTowards Goals: Progressing  Interventions: CBT  Summary: Tracey Beard is a 42 y.o. female who presents with MDD, recurrent, moderate.   Suicidal/Homicidal: Nowithout intent/plan  Therapist Response: Tracey Beard engaged well in individual in person session with clinician. Clinician utilized CBT to process thoughts, feelings, and interactions with others. Clinician discussed relationships with friends and support people in her life. Clinician identified ongoing challenges of ":weeding out" people who are not able to be supportive and seem to be looking out for themselves in the context of this relationships/friendship. Clinician identified validated the frustration and hurt it has caused. Clinician discussed ways to support herself and to find joy in her own growth and change. Clinician identified the ongoing importance of focusing on growth and change, seeking out new friendships and supports that are not from her "old life". Clinician also discussed changes in Tracey Beard's ability to cope with the loss of her mother. Tracey Beard shared major progress in throwing things away and integrating her mother's furniture into her home, as a way of keeping mother's energy close, rather than pushing it away. Increased "talking to mother" and thinking about her, rather than grieving hard and pushing memories away.    Plan: Return again in 1-2 weeks.  Diagnosis: MDD (major depressive disorder), recurrent episode, moderate (HCC)  Collaboration of Care: Psychiatrist AEB reviewed recent note from Dr. Donell Beers  Patient/Guardian was advised Release of Information must be obtained prior to any record release in order to collaborate their care with an outside provider. Patient/Guardian was advised if they have not already done so to contact the registration department to sign all necessary forms in order for [place] to release information regarding their care.   Consent: Patient/Guardian gives verbal consent for treatment and assignment of benefits for services provided during this visit. Patient/Guardian expressed understanding and agreed to proceed.   [name] [place], LCSW 07/18/2023

## 2023-07-23 ENCOUNTER — Ambulatory Visit: Payer: No Typology Code available for payment source | Admitting: Internal Medicine

## 2023-07-23 ENCOUNTER — Encounter: Payer: Self-pay | Admitting: Internal Medicine

## 2023-07-23 VITALS — BP 110/80 | HR 96 | Temp 98.7°F | Wt 175.3 lb

## 2023-07-23 DIAGNOSIS — R5383 Other fatigue: Secondary | ICD-10-CM

## 2023-07-23 DIAGNOSIS — G8929 Other chronic pain: Secondary | ICD-10-CM

## 2023-07-23 DIAGNOSIS — E785 Hyperlipidemia, unspecified: Secondary | ICD-10-CM

## 2023-07-23 DIAGNOSIS — I10 Essential (primary) hypertension: Secondary | ICD-10-CM

## 2023-07-23 DIAGNOSIS — F419 Anxiety disorder, unspecified: Secondary | ICD-10-CM | POA: Diagnosis not present

## 2023-07-23 DIAGNOSIS — E559 Vitamin D deficiency, unspecified: Secondary | ICD-10-CM

## 2023-07-23 DIAGNOSIS — Z7985 Long-term (current) use of injectable non-insulin antidiabetic drugs: Secondary | ICD-10-CM

## 2023-07-23 DIAGNOSIS — E1169 Type 2 diabetes mellitus with other specified complication: Secondary | ICD-10-CM

## 2023-07-23 DIAGNOSIS — F32A Depression, unspecified: Secondary | ICD-10-CM

## 2023-07-23 DIAGNOSIS — M25511 Pain in right shoulder: Secondary | ICD-10-CM

## 2023-07-23 LAB — POCT GLYCOSYLATED HEMOGLOBIN (HGB A1C): Hemoglobin A1C: 5.4 % (ref 4.0–5.6)

## 2023-07-23 MED ORDER — TIZANIDINE HCL 4 MG PO TABS: 4.0000 mg | ORAL_TABLET | Freq: Three times a day (TID) | ORAL | 1 refills | Status: DC

## 2023-07-23 NOTE — Assessment & Plan Note (Signed)
Well controlled on atorvastatin 20 mg

## 2023-07-23 NOTE — Assessment & Plan Note (Signed)
On BuSpar, Wellbutrin, followed by psychiatry, improved.

## 2023-07-23 NOTE — Progress Notes (Signed)
Established Patient Office Visit     CC/Reason for Visit: Follow-up chronic conditions, discuss acute concern  HPI: Tracey Beard is a 42 y.o. female who is coming in today for the above mentioned reasons. Past Medical History is significant for: Hypertension, hyperlipidemia, obesity, type 2 diabetes, anxiety depression and insomnia.  She is significantly improved, her mental health is better.  She is feeling well.  While working out at [employer] a couple months ago she heard a pop while doing shoulder raises and ever since then has had some limitation to range of motion of her right shoulder especially with lateral abduction and some pain.   Past Medical/Surgical History: Past Medical History:  Diagnosis Date   Anxiety    Chronic back pain    Depression    Gestational diabetes    glyburide   Headache    Hypertension    Idiopathic intracranial hypertension    Leukocytosis 02/25/2015   Obesity    Pregnancy induced hypertension    labetalol    Past Surgical History:  Procedure Laterality Date   CESAREAN SECTION N/A 01/29/2019   Procedure: CESAREAN SECTION;  Surgeon: Mitchel Honour, DO;  Location: [facility] LD ORS;  Service: Obstetrics;  Laterality: N/A;   CHOLECYSTECTOMY     LUMBAR PUNCTURE  2014   5x    Social History:  reports that she has never smoked. She has never used smokeless tobacco. She reports current alcohol use. She reports that she does not use drugs.  Allergies: Allergies  Allergen Reactions   Elavil [Amitriptyline Hcl] Shortness Of Breath   Restoril [Temazepam] Shortness Of Breath   Trazodone And Nefazodone Anaphylaxis   Diamox [Acetazolamide] Other (See Comments)    Cold chills/loss of taste    Tomato Swelling and Other (See Comments)    Causes lips to swell   Maxalt [Rizatriptan Benzoate] Other (See Comments)    Chest tightness and respiratory distress   Norco [Hydrocodone-Acetaminophen] Itching   Pollen Extract Other (See Comments)    Unknown  reaction    Benzocaine Swelling, Rash and Other (See Comments)    Localized swelling   Latex Rash   Novocain [Procaine] Swelling, Rash and Other (See Comments)    Localized swelling    Family History:  Family History  Problem Relation Age of Onset   Hypertension Mother    Breast cancer Mother    Pancreatic cancer Mother    Diabetes Mother    Hypertension Father    Hypertension Maternal Grandmother    Heart disease Maternal Grandmother    Hypertension Maternal Grandfather    Diabetes Maternal Grandfather    Colon cancer Paternal Grandmother    Heart disease Paternal Grandfather    Hypertension Maternal Aunt    Breast cancer Maternal Aunt    Diabetes Maternal Aunt    Hypertension Maternal Uncle    Liver cancer Maternal Uncle    Prostate cancer Maternal Uncle    Miscarriages / [place] Daughter      Current Outpatient Medications:    amLODipine (NORVASC) 5 MG tablet, Take 1 tablet (5 mg total) by mouth daily., Disp: 90 tablet, Rfl: 1   atorvastatin (LIPITOR) 20 MG tablet, TAKE ONE TABLET BY MOUTH EVERY DAY, Disp: 90 tablet, Rfl: 1   buPROPion (WELLBUTRIN XL) 300 MG 24 hr tablet, 1 qam  for 1 week then 2 qam, Disp: 90 tablet, Rfl: 2   busPIRone (BUSPAR) 5 MG tablet, 1 TID, Disp: 270 tablet, Rfl: 1   Continuous Glucose Receiver (  FREESTYLE LIBRE 3 READER) DEVI, 1 each by Does not apply route See admin instructions., Disp: 2 each, Rfl: 3   Continuous Glucose Sensor (FREESTYLE LIBRE 3 SENSOR) MISC, 1 each by Does not apply route See admin instructions. Place 1 sensor on the skin every 14 days. Use to check glucose continuously, Disp: 2 each, Rfl: 3   esomeprazole (NEXIUM) 40 MG capsule, Take 1 capsule (40 mg total) by mouth 2 (two) times daily before a meal., Disp: 60 capsule, Rfl: 5   ibuprofen (ADVIL) 800 MG tablet, Take 1 tablet (800 mg total) by mouth every 6 (six) hours as needed for moderate pain., Disp: 30 tablet, Rfl: 0   ondansetron (ZOFRAN) 4 MG tablet, Take 1 tablet (4  mg total) by mouth every 6 (six) hours., Disp: 12 tablet, Rfl: 0   OZEMPIC, 1 MG/DOSE, 4 MG/3ML SOPN, INJECT 1mg  SUBCUTANEOUSLY Once weekly, Disp: 3 mL, Rfl: 2   temazepam (RESTORIL) 15 MG capsule, 2 qhs, Disp: 60 capsule, Rfl: 4   triamterene-hydrochlorothiazide (MAXZIDE) 75-50 MG tablet, TAKE 1 TABLET BY MOUTH EVERY DAY, Disp: 90 tablet, Rfl: 1   Vitamin D, Ergocalciferol, (DRISDOL) 1.25 MG (50000 UNIT) CAPS capsule, Take 1 capsule (50,000 Units total) by mouth every 7 (seven) days., Disp: 12 capsule, Rfl: 0   potassium chloride SA (KLOR-CON M) 20 MEQ tablet, Take 1 tablet (20 mEq total) by mouth 2 (two) times daily for 5 days., Disp: 10 tablet, Rfl: 0   tiZANidine (ZANAFLEX) 4 MG tablet, Take 1 tablet (4 mg total) by mouth 3 (three) times daily., Disp: 30 tablet, Rfl: 1  Review of Systems:  Negative unless indicated in HPI.   Physical Exam: Vitals:   07/23/23 0737  BP: 110/80  Pulse: 96  Temp: 98.7 F (37.1 C)  TempSrc: Oral  SpO2: 99%  Weight: 175 lb 4.8 oz (79.5 kg)    Body mass index is 33.12 kg/m.   Physical Exam Vitals reviewed.  Constitutional:      Appearance: Normal appearance.  HENT:     Head: Normocephalic and atraumatic.  Eyes:     Conjunctiva/sclera: Conjunctivae normal.     Pupils: Pupils are equal, round, and reactive to light.  Cardiovascular:     Rate and Rhythm: Normal rate and regular rhythm.  Pulmonary:     Effort: Pulmonary effort is normal.     Breath sounds: Normal breath sounds.  Skin:    General: Skin is warm and dry.  Neurological:     General: No focal deficit present.     Mental Status: She is alert and oriented to person, place, and time.  Psychiatric:        Mood and Affect: Mood normal.        Behavior: Behavior normal.        Thought Content: Thought content normal.        Judgment: Judgment normal.      Impression and Plan:  Type 2 diabetes mellitus with other specified complication, without long-term current use of insulin  (HCC) Assessment & Plan: Excellent control with an A1c of 5.4. Continue Ozempic 1 mg weekly.  Orders: -     POCT glycosylated hemoglobin (Hb A1C)  Fatigue, unspecified type -     tiZANidine HCl; Take 1 tablet (4 mg total) by mouth 3 (three) times daily.  Dispense: 30 tablet; Refill: 1  Hyperlipidemia associated with type 2 diabetes mellitus (HCC) Assessment & Plan: Well-controlled on atorvastatin 20 mg.   Vitamin D deficiency Assessment & Plan:  Recheck levels with next lab draw.   Anxiety and depression Assessment & Plan: On BuSpar, Wellbutrin, followed by psychiatry, improved.   Primary hypertension Assessment & Plan: Well-controlled on current.   Chronic right shoulder pain -     Ambulatory referral to Orthopedic Surgery     Time spent:30 minutes reviewing chart, interviewing and examining patient and formulating plan of care.     Chaya Jan, MD [facility]

## 2023-07-23 NOTE — Assessment & Plan Note (Signed)
Recheck levels with next lab draw.

## 2023-07-23 NOTE — Assessment & Plan Note (Signed)
Excellent control with an A1c of 5.4. Continue Ozempic 1 mg weekly.

## 2023-07-23 NOTE — Assessment & Plan Note (Signed)
Well controlled on current

## 2023-07-24 ENCOUNTER — Encounter (HOSPITAL_COMMUNITY): Payer: Self-pay | Admitting: Licensed Clinical Social Worker

## 2023-07-24 ENCOUNTER — Ambulatory Visit (INDEPENDENT_AMBULATORY_CARE_PROVIDER_SITE_OTHER): Payer: No Typology Code available for payment source | Admitting: Licensed Clinical Social Worker

## 2023-07-24 DIAGNOSIS — F331 Major depressive disorder, recurrent, moderate: Secondary | ICD-10-CM
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
## 2023-07-31 ENCOUNTER — Ambulatory Visit (INDEPENDENT_AMBULATORY_CARE_PROVIDER_SITE_OTHER): Payer: No Typology Code available for payment source | Admitting: Licensed Clinical Social Worker

## 2023-07-31 ENCOUNTER — Encounter (HOSPITAL_COMMUNITY): Payer: Self-pay | Admitting: Licensed Clinical Social Worker

## 2023-07-31 DIAGNOSIS — F331 Major depressive disorder, recurrent, moderate: Secondary | ICD-10-CM
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
## 2023-08-07 ENCOUNTER — Encounter (HOSPITAL_COMMUNITY): Payer: Self-pay | Admitting: Licensed Clinical Social Worker

## 2023-08-07 ENCOUNTER — Ambulatory Visit (INDEPENDENT_AMBULATORY_CARE_PROVIDER_SITE_OTHER): Payer: No Typology Code available for payment source | Admitting: Licensed Clinical Social Worker

## 2023-08-07 DIAGNOSIS — F331 Major depressive disorder, recurrent, moderate: Secondary | ICD-10-CM

## 2023-08-07 NOTE — Progress Notes (Signed)
   THERAPIST PROGRESS NOTE  Session Time: 9:05-10:00am  Participation Level: Active  Behavioral Response: NeatAlertEuthymic and Irritable  Type of Therapy: Individual Therapy  Treatment Goals addressed:  LTG: Reduce frequency, intensity, and duration of depression symptoms so that daily functioning is improved (OP Depression)  Disciplines:  Interdisciplinary, PROVIDER    Expected end:  08/28/23          LTG: Increase coping skills to manage depression and improve ability to perform daily activities (OP Depression)  Disciplines:  Interdisciplinary, PROVIDER    Expected end:  08/28/23       ProgressTowards Goals: Progressing  Interventions: CBT  Summary: Tracey Beard is a 42 y.o. female who presents with MDD, moderate, recurrent.   Suicidal/Homicidal: Nowithout intent/plan  Therapist Response: Tracey Beard engaged well in individual session with clinician.  Clinician utilized cognitive behavioral therapy to explore thoughts feelings and behaviors over the past week.  Tracey Beard shared concerns that she has been struggling with accessible memory, such as finding the right word for an object or remembering why she walked in her room.  Clinician explored this in greater detail and noted that the level of overwhelm that Tracey Beard has been feeling may be interfering with her accessible memory.  Tracey Beard shared that she has been feeling very overwhelmed, and she has noted a difference in the way others treat her.  She shared that some people have said they miss "the old Tracey Beard".  Clinician discussed the boundaries that Tracey Beard has put in place with friends and family, which has disrupted old interaction patterns.  Clinician noted that others do not often like when we make changes because it requires them to act differently.  Clinician asked whether Tracey Beard liked her new way of being, to which she reported yes.  Tracey Beard shared she feels stronger and more confident in herself.  Plan: Return again in 1  weeks.  Diagnosis: MDD (major depressive disorder), recurrent episode, moderate (HCC)  Collaboration of Care: Psychiatrist AEB shared updates with Dr. Donell Beers.  Patient/Guardian was advised Release of Information must be obtained prior to any record release in order to collaborate their care with an outside provider. Patient/Guardian was advised if they have not already done so to contact the registration department to sign all necessary forms in order for [place] to release information regarding their care.   Consent: Patient/Guardian gives verbal consent for treatment and assignment of benefits for services provided during this visit. Patient/Guardian expressed understanding and agreed to proceed.   [name] [place], LCSW 08/07/2023

## 2023-08-14 ENCOUNTER — Other Ambulatory Visit: Payer: Self-pay | Admitting: Internal Medicine

## 2023-08-14 ENCOUNTER — Ambulatory Visit (INDEPENDENT_AMBULATORY_CARE_PROVIDER_SITE_OTHER): Payer: No Typology Code available for payment source | Admitting: Licensed Clinical Social Worker

## 2023-08-14 ENCOUNTER — Encounter (HOSPITAL_COMMUNITY): Payer: Self-pay | Admitting: Licensed Clinical Social Worker

## 2023-08-14 DIAGNOSIS — F331 Major depressive disorder, recurrent, moderate: Secondary | ICD-10-CM

## 2023-08-14 DIAGNOSIS — R5383 Other fatigue: Secondary | ICD-10-CM

## 2023-08-14 NOTE — Progress Notes (Signed)
   THERAPIST PROGRESS NOTE  Session Time: 9:00am-9:55am  Participation Level: Active  Behavioral Response: NeatAlertDysphoric  Type of Therapy: Individual Therapy  Treatment Goals addressed:  LTG: Reduce frequency, intensity, and duration of depression symptoms so that daily functioning is improved (OP Depression)  Disciplines:  Interdisciplinary, PROVIDER    Expected end:  08/28/23          LTG: Increase coping skills to manage depression and improve ability to perform daily activities (OP Depression)  Disciplines:  Interdisciplinary, PROVIDER    Expected end:  08/28/23       ProgressTowards Goals: Progressing  Interventions: CBT and Assertiveness Training  Summary: Tracey Beard is a 42 y.o. female who presents with MDD, recurrent, moderate.   Suicidal/Homicidal: Nowithout intent/plan  Therapist Response: [name] engaged well in individual and personal session with Facilities manager.  Clinician discussed interactions concerns about boundaries, and the need to "be mean".  Clinician provided psychoeducation and supportive counseling regarding changes in relationships due to her change in her attitude and boundaries.  Clinician discussed update with relationship with her husband.  [name] shared that she continues on her path of weight loss and self-esteem building.  Plan: Return again in 1 weeks.  Diagnosis: MDD (major depressive disorder), recurrent episode, moderate (HCC)  Collaboration of Care: Patient refused AEB none required  Patient/Guardian was advised Release of Information must be obtained prior to any record release in order to collaborate their care with an outside provider. Patient/Guardian was advised if they have not already done so to contact the registration department to sign all necessary forms in order for [place] to release information regarding their care.   Consent: Patient/Guardian gives verbal consent for treatment and assignment of benefits for services provided  during this visit. Patient/Guardian expressed understanding and agreed to proceed.   [name], LCSW 08/14/2023

## 2023-08-21 ENCOUNTER — Encounter (HOSPITAL_COMMUNITY): Payer: Self-pay | Admitting: Licensed Clinical Social Worker

## 2023-08-21 ENCOUNTER — Ambulatory Visit (INDEPENDENT_AMBULATORY_CARE_PROVIDER_SITE_OTHER): Payer: No Typology Code available for payment source | Admitting: Licensed Clinical Social Worker

## 2023-08-21 DIAGNOSIS — F331 Major depressive disorder, recurrent, moderate: Secondary | ICD-10-CM | POA: Diagnosis not present
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
## 2023-08-28 ENCOUNTER — Ambulatory Visit (INDEPENDENT_AMBULATORY_CARE_PROVIDER_SITE_OTHER): Payer: No Typology Code available for payment source | Admitting: Licensed Clinical Social Worker

## 2023-08-28 DIAGNOSIS — F331 Major depressive disorder, recurrent, moderate: Secondary | ICD-10-CM | POA: Diagnosis not present

## 2023-08-30 ENCOUNTER — Encounter (HOSPITAL_COMMUNITY): Payer: Self-pay | Admitting: Licensed Clinical Social Worker
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
## 2023-09-04 ENCOUNTER — Ambulatory Visit (INDEPENDENT_AMBULATORY_CARE_PROVIDER_SITE_OTHER): Payer: No Typology Code available for payment source | Admitting: Licensed Clinical Social Worker

## 2023-09-04 DIAGNOSIS — F331 Major depressive disorder, recurrent, moderate: Secondary | ICD-10-CM

## 2023-09-09 ENCOUNTER — Encounter (HOSPITAL_COMMUNITY): Payer: Self-pay | Admitting: Licensed Clinical Social Worker

## 2023-09-09 NOTE — Progress Notes (Signed)
   THERAPIST PROGRESS NOTE  Session Time: 9:00am-9:55am  Participation Level: Active  Behavioral Response: Well GroomedAlertEuthymic  Type of Therapy: Individual Therapy  Treatment Goals addressed:  TG: Reduce frequency, intensity, and duration of depression symptoms so that daily functioning is improved (OP Depression)  Disciplines:  Interdisciplinary, PROVIDER    Expected end:  08/28/23          LTG: Increase coping skills to manage depression and improve ability to perform daily activities (OP Depression)  Disciplines:  Interdisciplinary, PROVIDER    Expected end:  08/28/23        ProgressTowards Goals: Progressing  Interventions: CBT  Summary: Tracey Beard is a 42 y.o. female who presents with MDD, moderate.   Suicidal/Homicidal: Nowithout intent/plan  Therapist Response: [name] engaged well in individual session with clinician office.  Clinician utilized CBT to process thoughts feelings and interactions.  Clinician identified ongoing improvement in overall mood and thought processing.  Clinician also noted improvement in social interactions with husband, child, and friends.  Tracey Beard shared improvement in ability to set and maintain boundaries with others.  She shared increased motivation to protect her peace and to remove people who interrupt her peace.  Clinician explored readiness to process grief, noting that this is the last big hurdle to overcome.  Tracey Beard shared that she is looking forward to going through this part of treatment after the holidays.  Clinician processed thoughts and feelings about the holidays.  Tracey Beard shared some expected challenges, but increased confidence and coping skills.  Plan: Return again in 1-2 weeks.  Diagnosis: MDD (major depressive disorder), recurrent episode, moderate (HCC)  Collaboration of Care: Psychiatrist AEB provided updates to Dr. Donell Beers  Patient/Guardian was advised Release of Information must be obtained prior to any  record release in order to collaborate their care with an outside provider. Patient/Guardian was advised if they have not already done so to contact the registration department to sign all necessary forms in order for [place] to release information regarding their care.   Consent: Patient/Guardian gives verbal consent for treatment and assignment of benefits for services provided during this visit. Patient/Guardian expressed understanding and agreed to proceed.   [name], LCSW 09/09/2023

## 2023-09-18 ENCOUNTER — Encounter (HOSPITAL_COMMUNITY): Payer: Self-pay | Admitting: Licensed Clinical Social Worker

## 2023-09-18 ENCOUNTER — Ambulatory Visit (INDEPENDENT_AMBULATORY_CARE_PROVIDER_SITE_OTHER): Payer: No Typology Code available for payment source | Admitting: Licensed Clinical Social Worker

## 2023-09-18 ENCOUNTER — Encounter (HOSPITAL_COMMUNITY): Payer: Self-pay

## 2023-09-18 DIAGNOSIS — F4310 Post-traumatic stress disorder, unspecified: Secondary | ICD-10-CM | POA: Diagnosis not present

## 2023-09-18 DIAGNOSIS — F331 Major depressive disorder, recurrent, moderate: Secondary | ICD-10-CM | POA: Diagnosis not present
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
## 2023-09-25 ENCOUNTER — Ambulatory Visit (INDEPENDENT_AMBULATORY_CARE_PROVIDER_SITE_OTHER): Payer: No Typology Code available for payment source | Admitting: Licensed Clinical Social Worker

## 2023-09-25 ENCOUNTER — Encounter (HOSPITAL_COMMUNITY): Payer: Self-pay | Admitting: Licensed Clinical Social Worker

## 2023-09-25 DIAGNOSIS — F330 Major depressive disorder, recurrent, mild: Secondary | ICD-10-CM | POA: Diagnosis not present

## 2023-09-25 DIAGNOSIS — F4310 Post-traumatic stress disorder, unspecified: Secondary | ICD-10-CM | POA: Diagnosis not present
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
## 2023-10-02 ENCOUNTER — Ambulatory Visit (INDEPENDENT_AMBULATORY_CARE_PROVIDER_SITE_OTHER): Payer: No Typology Code available for payment source | Admitting: Licensed Clinical Social Worker

## 2023-10-02 ENCOUNTER — Encounter (HOSPITAL_COMMUNITY): Payer: Self-pay | Admitting: Licensed Clinical Social Worker

## 2023-10-02 DIAGNOSIS — F330 Major depressive disorder, recurrent, mild: Secondary | ICD-10-CM

## 2023-10-02 NOTE — Progress Notes (Signed)
   THERAPIST PROGRESS NOTE  Session Time: 9:00am-9:55am  Participation Level: Active  Behavioral Response: Well GroomedAlertEuthymic  Type of Therapy: Individual Therapy  Treatment Goals addressed:  TG: Reduce frequency, intensity, and duration of depression symptoms so that daily functioning is improved (OP Depression)  Disciplines:  Interdisciplinary, PROVIDER    Expected end:  03/17/24          LTG: Increase coping skills to manage depression and improve ability to perform daily activities (OP Depression)  Disciplines:  Interdisciplinary, PROVIDER    Expected end:  03/17/24       ProgressTowards Goals: Progressing  Interventions: CBT  Summary: Tracey Beard is a 43 y.o. female who presents with MDD, recurrent, mild.   Suicidal/Homicidal: Nowithout intent/plan  Therapist Response: [name] engaged well in individual session in clinic with clinician. Clinician utilized CBT to process thoughts, feelings, and interactions. Clinician explored mood, sleep, and appetite. Clinician also discussed interactions with husband, friends, and son. Clinician reflected the ongoing impact of her son in her life, the motivation to carry on and be motivated to get healthy. [name] shared that she experiences daily challenges in marriage, regarding financial issues. However, she will continue on for now until she is able to become financially independent.   Plan: Return again in 1 weeks.  Diagnosis: MDD (major depressive disorder), recurrent episode, mild (HCC)  Collaboration of Care: Patient refused AEB none required  Patient/Guardian was advised Release of Information must be obtained prior to any record release in order to collaborate their care with an outside provider. Patient/Guardian was advised if they have not already done so to contact the registration department to sign all necessary forms in order for us  to release information regarding their care.   Consent: Patient/Guardian gives  verbal consent for treatment and assignment of benefits for services provided during this visit. Patient/Guardian expressed understanding and agreed to proceed.   [name] [place], LCSW 10/02/2023

## 2023-10-08 ENCOUNTER — Other Ambulatory Visit: Payer: Self-pay | Admitting: Internal Medicine

## 2023-10-08 DIAGNOSIS — R5383 Other fatigue: Secondary | ICD-10-CM

## 2023-10-08 DIAGNOSIS — E1169 Type 2 diabetes mellitus with other specified complication: Secondary | ICD-10-CM

## 2023-10-09 ENCOUNTER — Ambulatory Visit (INDEPENDENT_AMBULATORY_CARE_PROVIDER_SITE_OTHER): Payer: No Typology Code available for payment source | Admitting: Licensed Clinical Social Worker

## 2023-10-09 DIAGNOSIS — F330 Major depressive disorder, recurrent, mild: Secondary | ICD-10-CM | POA: Diagnosis not present

## 2023-10-10 ENCOUNTER — Other Ambulatory Visit: Payer: Self-pay

## 2023-10-10 ENCOUNTER — Ambulatory Visit (HOSPITAL_BASED_OUTPATIENT_CLINIC_OR_DEPARTMENT_OTHER): Payer: No Typology Code available for payment source | Admitting: Psychiatry

## 2023-10-10 VITALS — BP 122/79 | HR 101 | Ht 61.0 in | Wt 167.0 lb

## 2023-10-10 DIAGNOSIS — F325 Major depressive disorder, single episode, in full remission: Secondary | ICD-10-CM | POA: Diagnosis not present

## 2023-10-10 MED ORDER — TEMAZEPAM 15 MG PO CAPS: ORAL_CAPSULE | ORAL | 4 refills | Status: DC

## 2023-10-10 MED ORDER — BUSPIRONE HCL 5 MG PO TABS: ORAL_TABLET | ORAL | 1 refills | Status: DC

## 2023-10-10 MED ORDER — BUPROPION HCL ER (XL) 300 MG PO TB24: ORAL_TABLET | ORAL | 2 refills | Status: DC
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
## 2023-10-15 ENCOUNTER — Encounter (HOSPITAL_COMMUNITY): Payer: Self-pay | Admitting: Licensed Clinical Social Worker

## 2023-10-15 NOTE — Progress Notes (Signed)
   THERAPIST PROGRESS NOTE  Session Time: 9:00am-10:00am  Participation Level: Active  Behavioral Response: Well GroomedAlertEuthymic  Type of Therapy: Individual Therapy  Treatment Goals addressed:  TG: Reduce frequency, intensity, and duration of depression symptoms so that daily functioning is improved (OP Depression)  Disciplines:  Interdisciplinary, PROVIDER    Expected end:  03/17/24          LTG: Increase coping skills to manage depression and improve ability to perform daily activities (OP Depression)  Disciplines:  Interdisciplinary, PROVIDER    Expected end:  03/17/24        ProgressTowards Goals: Progressing  Interventions: CBT  Summary: Tracey Beard is a 43 y.o. female who presents with MDD, recurrent, mild.   Suicidal/Homicidal: Nowithout intent/plan  Therapist Response: Tracey Beard engaged well in individual and person session with clinician.  Clinician explored interactions, thoughts, feelings over the past week using CBT.  Clinician discussed updates with relationships, friendships, and her journey of healing.  Clinician ongoing decisions to reduce contact with people who are not supportive of her healing journey.  Clinician discussed the impact of these decisions, noting that making these changes may result in loneliness.  Tracey Beard shared her willingness to be lonely as opposed to being surrounded by unsupportive people.  Tracey Beard shared that she has reduced her expectations for people to be as supportive as they say they are, because they do not seem to be happy with the changes that are being made in Tracey Beard's life.  Clinician explored Tracey Beard's level of joy and noted significant improvement by making these hard decisions and by focusing on her own physical and mental health.  Plan: Return again in 1 weeks.  Diagnosis: MDD (major depressive disorder), recurrent episode, mild (HCC)  Collaboration of Care: Psychiatrist AEB updated Dr. Donell Beers on Tracey Beard's  improvement.  Patient/Guardian was advised Release of Information must be obtained prior to any record release in order to collaborate their care with an outside provider. Patient/Guardian was advised if they have not already done so to contact the registration department to sign all necessary forms in order for [place] to release information regarding their care.   Consent: Patient/Guardian gives verbal consent for treatment and assignment of benefits for services provided during this visit. Patient/Guardian expressed understanding and agreed to proceed.   [name] [place], LCSW 10/15/2023

## 2023-10-16 ENCOUNTER — Other Ambulatory Visit: Payer: Self-pay | Admitting: Internal Medicine

## 2023-10-16 ENCOUNTER — Ambulatory Visit (HOSPITAL_COMMUNITY): Payer: No Typology Code available for payment source | Admitting: Licensed Clinical Social Worker

## 2023-10-16 DIAGNOSIS — E1169 Type 2 diabetes mellitus with other specified complication: Secondary | ICD-10-CM

## 2023-10-23 ENCOUNTER — Ambulatory Visit (INDEPENDENT_AMBULATORY_CARE_PROVIDER_SITE_OTHER): Payer: No Typology Code available for payment source | Admitting: Licensed Clinical Social Worker

## 2023-10-23 DIAGNOSIS — F330 Major depressive disorder, recurrent, mild: Secondary | ICD-10-CM

## 2023-10-23 NOTE — Progress Notes (Signed)
   THERAPIST PROGRESS NOTE  Session Time: 9:00am-9:55am  Participation Level: Active  Behavioral Response: NeatAlertEuthymic  Type of Therapy: Individual Therapy  Treatment Goals addressed:  TG: Reduce frequency, intensity, and duration of depression symptoms so that daily functioning is improved (OP Depression)  Disciplines:  Interdisciplinary, PROVIDER    Expected end:  03/17/24          LTG: Increase coping skills to manage depression and improve ability to perform daily activities (OP Depression)  Disciplines:  Interdisciplinary, PROVIDER    Expected end:  03/17/24         ProgressTowards Goals: Progressing  Interventions: CBT  Summary: Tracey Beard is a 43 y.o. female who presents with MDD, recurrent, mild.   Suicidal/Homicidal: Nowithout intent/plan  Therapist Response: Tracey Beard engaged well in session in person with clinician. Clinician utilized CBT to process thoughts, feelings, and interactions. Tracey Beard shared recent challenges and ongoing frustrations with marital relationship. Clinician provided time and space for Tracey Beard to process through recent issues. Clinician identified significant changes in attitude about her marriage, her friends, and family. Tracey Beard shared updates from father and step-mother. Clinician processed hx of interactions with step-mother and explored the current boundaries in place to protect Tracey Beard emotionally. Clinician processed coping skills and incorporation of skills into ADLs. Tracey Beard shared daily affirmations, meditation, deep breathing, and time exercising and being outside in nature are highly supportive of daily mental health. Tracey Beard also shared that she continues to read a lot about incorporating mindfulness into her life.  Clinician processed recent dreams Tracey Beard has had of her mother, and the positive emotional release upon waking.  Plan: Return again in 1 weeks.  Diagnosis: MDD (major depressive disorder), recurrent episode, mild  (HCC)  Collaboration of Care: Psychiatrist AEB updated Dr.Plovsky about significant improvements and plan to start reducing medication in the coming months.   Patient/Guardian was advised Release of Information must be obtained prior to any record release in order to collaborate their care with an outside provider. Patient/Guardian was advised if they have not already done so to contact the registration department to sign all necessary forms in order for us  to release information regarding their care.   Consent: Patient/Guardian gives verbal consent for treatment and assignment of benefits for services provided during this visit. Patient/Guardian expressed understanding and agreed to proceed.   Tracey Beard [place], LCSW 10/23/2023

## 2023-10-24 ENCOUNTER — Encounter (HOSPITAL_COMMUNITY): Payer: Self-pay | Admitting: Licensed Clinical Social Worker

## 2023-10-30 ENCOUNTER — Encounter (HOSPITAL_COMMUNITY): Payer: Self-pay | Admitting: Licensed Clinical Social Worker

## 2023-10-30 ENCOUNTER — Ambulatory Visit (INDEPENDENT_AMBULATORY_CARE_PROVIDER_SITE_OTHER): Payer: No Typology Code available for payment source | Admitting: Licensed Clinical Social Worker

## 2023-10-30 DIAGNOSIS — F330 Major depressive disorder, recurrent, mild: Secondary | ICD-10-CM | POA: Diagnosis not present

## 2023-10-30 NOTE — Progress Notes (Signed)
   THERAPIST PROGRESS NOTE  Session Time: 9:00am-10:00am  Participation Level: Active  Behavioral Response: Well GroomedAlertEuthymic  Type of Therapy: Individual Therapy  Treatment Goals addressed:  TG: Reduce frequency, intensity, and duration of depression symptoms so that daily functioning is improved (OP Depression)  Disciplines:  Interdisciplinary, PROVIDER    Expected end:  03/17/24          LTG: Increase coping skills to manage depression and improve ability to perform daily activities (OP Depression)  Disciplines:  Interdisciplinary, PROVIDER    Expected end:  03/17/24       ProgressTowards Goals: Progressing  Interventions: CBT  Summary: Tracey Beard is a 43 y.o. female who presents with MDD, recurrent, mild.   Suicidal/Homicidal: Nowithout intent/plan  Therapist Response: Tracey Beard engaged well in individual session in the office.  Clinician utilized CBT to process thoughts feelings and interactions.  Clinician discussed current mood and activity level.  Clinician also discussed thought processes and success with cognitive restructuring.  Clinician reviewed daily schedule and routine.  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  Clinician explored ways to incorporate son into the routine with daily meditation, walks outside, and quiet time for reflection.  Clinician identified the value of incorporating this with 25-year-old, as they are both seeing things for the first time and appreciating nature on a different level.  Tracey Beard shared significant improvement in her depressive symptoms since starting therapy.  She reports willingness and eagerness to manage herself and her surroundings in order to preserve her peace.  Plan: Return again in 1 weeks.  Diagnosis: MDD (major depressive disorder), recurrent episode, mild (HCC)  Collaboration of Care: Patient refused AEB none  required  Patient/Guardian was advised Release of Information must be obtained prior to any record release in order to collaborate their care with an outside provider. Patient/Guardian was advised if they have not already done so to contact the registration department to sign all necessary forms in order for [place] to release information regarding their care.   Consent: Patient/Guardian gives verbal consent for treatment and assignment of benefits for services provided during this visit. Patient/Guardian expressed understanding and agreed to proceed.   [name] [place], LCSW 10/30/2023

## 2023-11-02 ENCOUNTER — Telehealth: Payer: No Typology Code available for payment source | Admitting: Physician Assistant

## 2023-11-02 DIAGNOSIS — B3731 Acute candidiasis of vulva and vagina: Secondary | ICD-10-CM

## 2023-11-02 MED ORDER — FLUCONAZOLE 150 MG PO TABS: 150.0000 mg | ORAL_TABLET | ORAL | 0 refills | Status: DC | PRN

## 2023-11-02 NOTE — Progress Notes (Signed)

## 2023-11-06 ENCOUNTER — Ambulatory Visit (INDEPENDENT_AMBULATORY_CARE_PROVIDER_SITE_OTHER): Payer: No Typology Code available for payment source | Admitting: Licensed Clinical Social Worker

## 2023-11-06 ENCOUNTER — Encounter (HOSPITAL_COMMUNITY): Payer: Self-pay | Admitting: Licensed Clinical Social Worker

## 2023-11-06 DIAGNOSIS — F331 Major depressive disorder, recurrent, moderate: Secondary | ICD-10-CM

## 2023-11-06 NOTE — Progress Notes (Signed)
   THERAPIST PROGRESS NOTE  Session Time: 9:00am-9:55am  Participation Level: Active  Behavioral Response: Well GroomedAlertEuthymic  Type of Therapy: Individual Therapy  Treatment Goals addressed:  TG: Reduce frequency, intensity, and duration of depression symptoms so that daily functioning is improved (OP Depression)  Disciplines:  Interdisciplinary, PROVIDER    Expected end:  03/17/24          LTG: Increase coping skills to manage depression and improve ability to perform daily activities (OP Depression)  Disciplines:  Interdisciplinary, PROVIDER    Expected end:  03/17/24           ProgressTowards Goals: Progressing  Interventions: CBT  Summary: Tracey Beard is a 43 y.o. female who presents with MDD, recurrent, moderate.   Suicidal/Homicidal: Nowithout intent/plan  Therapist Response: Tracey Beard engaged well in individual in person session. Clinician utilized CBT to process thoughts, feelings, and interactions. Tracey Beard shared updates about recent lucid dreams and "waking dreams". Clinician processed concerns about these dreams and noted that they may be signals to wake up, due to setting internal alarm, or due to circadian rhythm. Clinician discussed ways to incorporate lucid dreaming when she can recognize that this is a dream and can make different choices.  Clinician processed grief, as the anniversary of miscarriage was on 2/14. Tracey Beard shared a different attitude toward this grief, noting that she was not anxious, but allowed the tears to flow as needed and then she was able to keep moving. Clinician reflected the confidence in [place] and the value of being able to allow the feelings to come and go without significant injury or disruption to the routine.  Plan: Return again in 1 weeks.  Diagnosis: MDD (major depressive disorder), recurrent episode, moderate (HCC)  Collaboration of Care: Patient refused AEB none required  Patient/Guardian was advised Release of  Information must be obtained prior to any record release in order to collaborate their care with an outside provider. Patient/Guardian was advised if they have not already done so to contact the registration department to sign all necessary forms in order for [place] to release information regarding their care.   Consent: Patient/Guardian gives verbal consent for treatment and assignment of benefits for services provided during this visit. Patient/Guardian expressed understanding and agreed to proceed.   Tracey Beard [place], LCSW 11/06/2023

## 2023-11-13 ENCOUNTER — Ambulatory Visit (INDEPENDENT_AMBULATORY_CARE_PROVIDER_SITE_OTHER): Payer: No Typology Code available for payment source | Admitting: Licensed Clinical Social Worker

## 2023-11-13 ENCOUNTER — Encounter (HOSPITAL_COMMUNITY): Payer: Self-pay | Admitting: Licensed Clinical Social Worker

## 2023-11-13 DIAGNOSIS — F330 Major depressive disorder, recurrent, mild: Secondary | ICD-10-CM

## 2023-11-13 NOTE — Progress Notes (Signed)
   THERAPIST PROGRESS NOTE  Session Time: 9:00am-9:55am  Participation Level: Active  Behavioral Response: Well GroomedAlertEuthymic  Type of Therapy: Individual Therapy  Treatment Goals addressed:  TG: Reduce frequency, intensity, and duration of depression symptoms so that daily functioning is improved (OP Depression)  Disciplines:  Interdisciplinary, PROVIDER    Expected end:  03/17/24          LTG: Increase coping skills to manage depression and improve ability to perform daily activities (OP Depression)  Disciplines:  Interdisciplinary, PROVIDER    Expected end:  03/17/24       ProgressTowards Goals: Progressing  Interventions: CBT  Summary: Tracey Beard is a 43 y.o. female who presents with MDD, recurrent, mild.   Suicidal/Homicidal: Nowithout intent/plan  Therapist Response: Tracey Beard engaged well in individual and person session with clinician.  Clinician utilized CBT to process thoughts feelings and interactions.  Clinician noted improvement in overall appearance and attitude, noting a smile on her face and better self-care.  Tracey Beard shared that she has turned a corner regarding her grief.  The anniversary of her mother's death is coming up, and Tracey Beard feels more capable of handling it.  Clinician reflected the change in mindset and cognitive restructuring that has occurred over the past 6 months.  Tracey Beard agreed that she feels more encouragement and excitement than fear or anger.  Clinician processed changes in relationship with husband.  Tracey Beard shared that husband has been more generous financially, as well as offering more unexpected gifts.  Clinician explored Tracey Beard's willingness to accept his friendship and peace offerings.  Tracey Beard shared that she is so focused on her own growth and joy, she can allow herself to be around husband without it impacting her mood either way.  Plan: Return again in 1 weeks.  Diagnosis: MDD (major depressive disorder), recurrent episode,  mild (HCC)  Collaboration of Care: Patient refused AEB none required  Patient/Guardian was advised Release of Information must be obtained prior to any record release in order to collaborate their care with an outside provider. Patient/Guardian was advised if they have not already done so to contact the registration department to sign all necessary forms in order for [place] to release information regarding their care.   Consent: Patient/Guardian gives verbal consent for treatment and assignment of benefits for services provided during this visit. Patient/Guardian expressed understanding and agreed to proceed.   [name] [place], LCSW 11/13/2023

## 2023-11-14 ENCOUNTER — Other Ambulatory Visit: Payer: Self-pay | Admitting: Internal Medicine

## 2023-11-14 DIAGNOSIS — R5383 Other fatigue: Secondary | ICD-10-CM

## 2023-11-20 ENCOUNTER — Ambulatory Visit (HOSPITAL_COMMUNITY): Admitting: Licensed Clinical Social Worker

## 2023-11-20 ENCOUNTER — Ambulatory Visit (HOSPITAL_COMMUNITY): Payer: No Typology Code available for payment source | Admitting: Licensed Clinical Social Worker

## 2023-11-21 ENCOUNTER — Ambulatory Visit (INDEPENDENT_AMBULATORY_CARE_PROVIDER_SITE_OTHER): Admitting: Internal Medicine

## 2023-11-21 ENCOUNTER — Encounter: Payer: Self-pay | Admitting: Internal Medicine

## 2023-11-21 VITALS — BP 110/80 | HR 78 | Temp 98.6°F | Wt 169.8 lb

## 2023-11-21 DIAGNOSIS — Z8669 Personal history of other diseases of the nervous system and sense organs: Secondary | ICD-10-CM | POA: Diagnosis not present

## 2023-11-21 DIAGNOSIS — E1169 Type 2 diabetes mellitus with other specified complication: Secondary | ICD-10-CM | POA: Diagnosis not present

## 2023-11-21 DIAGNOSIS — F411 Generalized anxiety disorder: Secondary | ICD-10-CM

## 2023-11-21 DIAGNOSIS — I10 Essential (primary) hypertension: Secondary | ICD-10-CM

## 2023-11-21 DIAGNOSIS — E785 Hyperlipidemia, unspecified: Secondary | ICD-10-CM

## 2023-11-21 LAB — POCT GLYCOSYLATED HEMOGLOBIN (HGB A1C): Hemoglobin A1C: 5.6 % (ref 4.0–5.6)

## 2023-11-21 MED ORDER — SUMATRIPTAN SUCCINATE 50 MG PO TABS: 50.0000 mg | ORAL_TABLET | ORAL | 0 refills | Status: DC | PRN

## 2023-11-21 NOTE — Progress Notes (Signed)
 Established Patient Office Visit     CC/Reason for Visit: Discuss chronic and acute concerns  HPI: Tracey Beard is a 43 y.o. female who is coming in today for the above mentioned reasons. Past Medical History is significant for: Hypertension, hyperlipidemia, obesity, type 2 diabetes, generalized anxiety disorder and insomnia.  She is following with psychiatry and is doing well.  Blood pressure is well-controlled.  She has always had a migraine headaches but feels like they have worsened lately.  Per reports she has a migraine every single day.  She tried Topamax in the past but did not tolerate it well.  She is on a daily basis using OTC medications including Excedrin, Tylenol, ibuprofen.   Past Medical/Surgical History: Past Medical History:  Diagnosis Date   Anxiety    Chronic back pain    Depression    Gestational diabetes    glyburide   Headache    Hypertension    Idiopathic intracranial hypertension    Leukocytosis 02/25/2015   Obesity    Pregnancy induced hypertension    labetalol    Past Surgical History:  Procedure Laterality Date   CESAREAN SECTION N/A 01/29/2019   Procedure: CESAREAN SECTION;  Surgeon: Mitchel Honour, DO;  Location: [facility] LD ORS;  Service: Obstetrics;  Laterality: N/A;   CHOLECYSTECTOMY     LUMBAR PUNCTURE  2014   5x    Social History:  reports that she has never smoked. She has never used smokeless tobacco. She reports current alcohol use. She reports that she does not use drugs.  Allergies: Allergies  Allergen Reactions   Elavil [Amitriptyline Hcl] Shortness Of Breath   Restoril [Temazepam] Shortness Of Breath   Trazodone And Nefazodone Anaphylaxis   Diamox [Acetazolamide] Other (See Comments)    Cold chills/loss of taste    Tomato Swelling and Other (See Comments)    Causes lips to swell   Norco [Hydrocodone-Acetaminophen] Itching   Pollen Extract Other (See Comments)    Unknown reaction    Benzocaine Swelling, Rash and Other (See  Comments)    Localized swelling   Latex Rash   Novocain [Procaine] Swelling, Rash and Other (See Comments)    Localized swelling    Family History:  Family History  Problem Relation Age of Onset   Hypertension Mother    Breast cancer Mother    Pancreatic cancer Mother    Diabetes Mother    Hypertension Father    Hypertension Maternal Grandmother    Heart disease Maternal Grandmother    Hypertension Maternal Grandfather    Diabetes Maternal Grandfather    Colon cancer Paternal Grandmother    Heart disease Paternal Grandfather    Hypertension Maternal Aunt    Breast cancer Maternal Aunt    Diabetes Maternal Aunt    Hypertension Maternal Uncle    Liver cancer Maternal Uncle    Prostate cancer Maternal Uncle    Miscarriages / [place] Daughter      Current Outpatient Medications:    amLODipine (NORVASC) 5 MG tablet, Take 1 tablet (5 mg total) by mouth daily., Disp: 90 tablet, Rfl: 1   atorvastatin (LIPITOR) 20 MG tablet, TAKE ONE TABLET BY MOUTH EVERY DAY, Disp: 90 tablet, Rfl: 1   buPROPion (WELLBUTRIN XL) 300 MG 24 hr tablet, 1 qam  for 1 week then 2 qam, Disp: 90 tablet, Rfl: 2   busPIRone (BUSPAR) 5 MG tablet, 1 TID, Disp: 270 tablet, Rfl: 1   Continuous Glucose Receiver (FREESTYLE LIBRE 3 READER) DEVI, 1  each by Does not apply route See admin instructions., Disp: 2 each, Rfl: 3   Continuous Glucose Sensor (FREESTYLE LIBRE 3 SENSOR) MISC, 1 each by Does not apply route See admin instructions. Place 1 sensor on the skin every 14 days. Use to check glucose continuously, Disp: 2 each, Rfl: 3   esomeprazole (NEXIUM) 40 MG capsule, Take 1 capsule (40 mg total) by mouth 2 (two) times daily before a meal., Disp: 60 capsule, Rfl: 5   fluconazole (DIFLUCAN) 150 MG tablet, Take 1 tablet (150 mg total) by mouth every 3 (three) days as needed., Disp: 2 tablet, Rfl: 0   ibuprofen (ADVIL) 800 MG tablet, Take 1 tablet (800 mg total) by mouth every 6 (six) hours as needed for moderate  pain., Disp: 30 tablet, Rfl: 0   ondansetron (ZOFRAN) 4 MG tablet, Take 1 tablet (4 mg total) by mouth every 6 (six) hours., Disp: 12 tablet, Rfl: 0   OZEMPIC, 1 MG/DOSE, 4 MG/3ML SOPN, INJECT 1mg  SUBCUTANEOUSLY Once weekly, Disp: 3 mL, Rfl: 2   SUMAtriptan (IMITREX) 50 MG tablet, Take 1 tablet (50 mg total) by mouth every 2 (two) hours as needed for migraine. May repeat in 2 hours if headache persists or recurs., Disp: 10 tablet, Rfl: 0   temazepam (RESTORIL) 15 MG capsule, 2 qhs, Disp: 60 capsule, Rfl: 4   tiZANidine (ZANAFLEX) 4 MG tablet, TAKE ONE TABLET BY MOUTH THREE TIMES DAILY AS NEEDED musle SPASMS, Disp: 30 tablet, Rfl: 1   triamterene-hydrochlorothiazide (MAXZIDE) 75-50 MG tablet, TAKE 1 TABLET BY MOUTH EVERY DAY, Disp: 90 tablet, Rfl: 1   Vitamin D, Ergocalciferol, (DRISDOL) 1.25 MG (50000 UNIT) CAPS capsule, Take 1 capsule (50,000 Units total) by mouth every 7 (seven) days., Disp: 12 capsule, Rfl: 0   potassium chloride SA (KLOR-CON M) 20 MEQ tablet, Take 1 tablet (20 mEq total) by mouth 2 (two) times daily for 5 days., Disp: 10 tablet, Rfl: 0  Review of Systems:  Negative unless indicated in HPI.   Physical Exam: Vitals:   11/21/23 1020  BP: 110/80  Pulse: 78  Temp: 98.6 F (37 C)  TempSrc: Oral  SpO2: 98%  Weight: 169 lb 12.8 oz (77 kg)    Body mass index is 32.08 kg/m.   Physical Exam Vitals reviewed.  Constitutional:      Appearance: Normal appearance.  HENT:     Head: Normocephalic and atraumatic.  Eyes:     Conjunctiva/sclera: Conjunctivae normal.     Pupils: Pupils are equal, round, and reactive to light.  Cardiovascular:     Rate and Rhythm: Normal rate and regular rhythm.  Pulmonary:     Effort: Pulmonary effort is normal.     Breath sounds: Normal breath sounds.  Skin:    General: Skin is warm and dry.  Neurological:     General: No focal deficit present.     Mental Status: She is alert and oriented to person, place, and time.  Psychiatric:         Mood and Affect: Mood normal.        Behavior: Behavior normal.        Thought Content: Thought content normal.        Judgment: Judgment normal.      Impression and Plan:  Type 2 diabetes mellitus with other specified complication, without long-term current use of insulin (HCC) -     POCT glycosylated hemoglobin (Hb A1C)  GAD (generalized anxiety disorder)  Hyperlipidemia associated with type 2 diabetes mellitus (  HCC)  Primary hypertension  Hx of migraine headaches -     SUMAtriptan Succinate; Take 1 tablet (50 mg total) by mouth every 2 (two) hours as needed for migraine. May repeat in 2 hours if headache persists or recurs.  Dispense: 10 tablet; Refill: 0 -     Ambulatory referral to Neurology   -Blood pressure is well-controlled. -A1c demonstrates excellent glycemic management at 5.6. -Mood is stable and improving. -Even though she has a history of migraines I suspect part of her problem is likely rebound headaches from excessive amount of OTC medication use.  I will prescribe Imitrex and place referral to neurology.  She has been asked to try to wean off the amount of OTC medication she has been using.  Time spent:30 minutes reviewing chart, interviewing and examining patient and formulating plan of care.     Chaya Jan, MD [facility]

## 2023-11-23 ENCOUNTER — Other Ambulatory Visit: Payer: Self-pay | Admitting: Internal Medicine

## 2023-11-23 DIAGNOSIS — I10 Essential (primary) hypertension: Secondary | ICD-10-CM

## 2023-11-27 ENCOUNTER — Ambulatory Visit (HOSPITAL_COMMUNITY): Payer: No Typology Code available for payment source | Admitting: Licensed Clinical Social Worker

## 2023-11-28 ENCOUNTER — Other Ambulatory Visit: Payer: Self-pay | Admitting: Internal Medicine

## 2023-11-28 ENCOUNTER — Telehealth: Payer: Self-pay | Admitting: Internal Medicine

## 2023-11-28 MED ORDER — ONDANSETRON 4 MG PO TBDP: 4.0000 mg | ORAL_TABLET | Freq: Three times a day (TID) | ORAL | 0 refills | Status: DC | PRN

## 2023-11-28 NOTE — Telephone Encounter (Signed)
 Left message on machine for patient to return our call

## 2023-11-28 NOTE — Telephone Encounter (Signed)
 Copied from CRM [id number]. Topic: Clinical - Medication Question >> Nov 28, 2023 10:16 AM Alcus Dad wrote: Reason for CRM: Dr. Ardyth Harps prescribed patient SUMAtriptan (IMITREX) 50 MG tablet for migraines.. Patient stated that medication is making her sick and she has been throwing up since Saturday. Patient wants to know if Dr. Evaristo Bury prescribe her ondansetron (ZOFRAN) 4 MG tablet for the nausea

## 2023-11-29 NOTE — Telephone Encounter (Signed)
 Patient is aware.  Patient states that she did have body aches, nausea, and vomiting but is feeling some better today.

## 2023-12-04 ENCOUNTER — Other Ambulatory Visit (HOSPITAL_COMMUNITY): Payer: Self-pay | Admitting: Psychiatry

## 2023-12-04 ENCOUNTER — Ambulatory Visit (INDEPENDENT_AMBULATORY_CARE_PROVIDER_SITE_OTHER): Payer: No Typology Code available for payment source | Admitting: Licensed Clinical Social Worker

## 2023-12-04 ENCOUNTER — Encounter (HOSPITAL_COMMUNITY): Payer: Self-pay | Admitting: Licensed Clinical Social Worker

## 2023-12-04 DIAGNOSIS — F330 Major depressive disorder, recurrent, mild: Secondary | ICD-10-CM

## 2023-12-04 NOTE — Progress Notes (Signed)
   THERAPIST PROGRESS NOTE  Session Time: 9:00am-9:55am  Participation Level: Active  Behavioral Response: Well GroomedAlertEuthymic  Type of Therapy: Individual Therapy  Treatment Goals addressed:  TG: Reduce frequency, intensity, and duration of depression symptoms so that daily functioning is improved (OP Depression)  Disciplines:  Interdisciplinary, PROVIDER    Expected end:  03/17/24          LTG: Increase coping skills to manage depression and improve ability to perform daily activities (OP Depression)  Disciplines:  Interdisciplinary, PROVIDER    Expected end:  03/17/24         ProgressTowards Goals: Progressing  Interventions: CBT  Summary: Tracey Beard is a 43 y.o. female who presents with MDD, recurrent, mild.   Suicidal/Homicidal: Nowithout intent/plan  Therapist Response: Tracey Beard engaged well in individual and person session with clinician.  Clinician utilized CBT to process thoughts feelings and interactions.  Clinician assessed current mood and increased awareness of her triggers and appropriate coping skills.  Clinician explored relationship with husband and noted improvement in attitude and kindness between Tracey Beard and husband.  Clinician explored anger and grudges held over a long period of time.  Clinician identified the "power" that comes with holding a grudge.  However clinician also noted that the burden of the grudge is on the 1 that holds the grudge, not the one who the grudges against.  Clinician shared the joy and importance of releasing the grudge in order to heal and reduce the stress and burden of being angry. Tracey Beard shared that she will have an upcoming court date this week, and she feels confident that it will go in her favor.  Tracey Beard also shared concern about trembling hands and brain fog.  Clinician will share this with Dr. Donell Beers in case this is a side effect of medication.  Plan: Return again in 2 weeks.  Diagnosis: MDD (major depressive  disorder), recurrent episode, mild (HCC)  Collaboration of Care: Psychiatrist AEB will inform Dr. Donell Beers about concerns about meds.  Dr. Donell Beers does not have availability for an appointment before Tracey Beard's next appointment is scheduled.  Tracey Beard will be put on the waiting list.  Patient/Guardian was advised Release of Information must be obtained prior to any record release in order to collaborate their care with an outside provider. Patient/Guardian was advised if they have not already done so to contact the registration department to sign all necessary forms in order for [place] to release information regarding their care.   Consent: Patient/Guardian gives verbal consent for treatment and assignment of benefits for services provided during this visit. Patient/Guardian expressed understanding and agreed to proceed.   [name] [place], LCSW 12/04/2023

## 2023-12-11 ENCOUNTER — Encounter (HOSPITAL_COMMUNITY): Payer: Self-pay

## 2023-12-11 ENCOUNTER — Ambulatory Visit (HOSPITAL_COMMUNITY): Payer: No Typology Code available for payment source | Admitting: Licensed Clinical Social Worker

## 2023-12-12 ENCOUNTER — Ambulatory Visit (HOSPITAL_COMMUNITY): Admitting: Psychiatry

## 2023-12-12 ENCOUNTER — Encounter (HOSPITAL_COMMUNITY): Payer: Self-pay

## 2023-12-13 ENCOUNTER — Other Ambulatory Visit: Payer: Self-pay | Admitting: Internal Medicine

## 2023-12-13 DIAGNOSIS — R5383 Other fatigue: Secondary | ICD-10-CM

## 2023-12-18 ENCOUNTER — Encounter (HOSPITAL_COMMUNITY): Payer: Self-pay | Admitting: Licensed Clinical Social Worker

## 2023-12-18 ENCOUNTER — Ambulatory Visit (INDEPENDENT_AMBULATORY_CARE_PROVIDER_SITE_OTHER): Admitting: Licensed Clinical Social Worker

## 2023-12-18 DIAGNOSIS — F330 Major depressive disorder, recurrent, mild: Secondary | ICD-10-CM

## 2023-12-18 NOTE — Progress Notes (Signed)
   THERAPIST PROGRESS NOTE  Session Time: 8:00am-8:55am  Participation Level: Active  Behavioral Response: Well GroomedAlertEuthymic  Type of Therapy: Individual Therapy  Treatment Goals addressed:  TG: Reduce frequency, intensity, and duration of depression symptoms so that daily functioning is improved (OP Depression)  Disciplines:  Interdisciplinary, PROVIDER    Expected end:  03/17/24          LTG: Increase coping skills to manage depression and improve ability to perform daily activities (OP Depression)  Disciplines:  Interdisciplinary, PROVIDER    Expected end:  03/17/24       ProgressTowards Goals: Progressing  Interventions: CBT  Summary: Cristianna Cyr is a 43 y.o. female who presents with MDD, recurrent, mild.   Suicidal/Homicidal: Nowithout intent/plan  Therapist Response: Revecca engaged well in individual session with clinician. Clinician utilized CBT to process thoughts, feelings, and interactions. Clinician reviewed medications with Louiza and discussed doses in order to address confusion. Clinician identified Karmon's plan to reduce Wellbutrin and Temazepam, as things seem to be going much better in her life. Clinician processed thoughts and feelings about reducing Buspar and encouraged Jordann to address these concerns with Dr. Donell Beers at appt tomorrow. Clinician discussed overall mental health, mood, and anxiety levels. Angelene shared that her coping has been much better. She shared her ability to set boundaries with people in her life, which has made improvements in her overall stress levels. Clinician noted that medications will not change what is happening in her life, but will give her the ability to process and change her reactions.  Clinician noted influx of migraine headaches which have been interfering with attending medical and counseling appts. Clinician encouraged Man to continue reaching out to headache specialist, and to also think about the impact  of pollen and allergies on her headaches, in terms of home treatment.   Plan: Return again in 2 weeks.  Diagnosis: MDD (major depressive disorder), recurrent episode, mild (HCC)  Collaboration of Care: Psychiatrist AEB Kemaria will see Dr. Donell Beers in person tomorrow. Still experiencing some small tremors in hands.   Patient/Guardian was advised Release of Information must be obtained prior to any record release in order to collaborate their care with an outside provider. Patient/Guardian was advised if they have not already done so to contact the registration department to sign all necessary forms in order for [place] to release information regarding their care.   Consent: Patient/Guardian gives verbal consent for treatment and assignment of benefits for services provided during this visit. Patient/Guardian expressed understanding and agreed to proceed.   [name] [place], LCSW 12/18/2023

## 2023-12-19 ENCOUNTER — Ambulatory Visit (HOSPITAL_BASED_OUTPATIENT_CLINIC_OR_DEPARTMENT_OTHER): Admitting: Psychiatry

## 2023-12-19 ENCOUNTER — Encounter (HOSPITAL_COMMUNITY): Payer: Self-pay | Admitting: Psychiatry

## 2023-12-19 ENCOUNTER — Other Ambulatory Visit: Payer: Self-pay

## 2023-12-19 VITALS — BP 116/77 | HR 81 | Ht 61.0 in | Wt 169.0 lb

## 2023-12-19 DIAGNOSIS — F325 Major depressive disorder, single episode, in full remission: Secondary | ICD-10-CM

## 2023-12-19 MED ORDER — TEMAZEPAM 15 MG PO CAPS: ORAL_CAPSULE | ORAL | 4 refills | Status: DC

## 2023-12-19 MED ORDER — BUSPIRONE HCL 5 MG PO TABS: ORAL_TABLET | ORAL | 1 refills | Status: DC

## 2023-12-19 MED ORDER — BUPROPION HCL ER (XL) 150 MG PO TB24: ORAL_TABLET | ORAL | 1 refills | Status: DC

## 2023-12-19 NOTE — Progress Notes (Signed)
 Psychiatric Initial Adult Assessment   Patient Identification: Tracey Beard MRN:  [id number] Date of Evaluation:  12/19/2023 Referral Source: Dr. Porfirio Mylar Dohmeier Chief Complaint: Depression Visit Diagnosis:   History of Present Illness:     Today the patient is doing well.  Her mood is definitely improved.  She loves spending time with her 41-year-old son.  The patient denies daily depression.  She is sleeping and eating well.  She loves going to [employer] with her son.  She also loves to meditate.  She is interested in ultimately coming off her psychotropic medicines.  He is doing very well in therapy.  She says her husband are now beginning to get along better.  She says he is somewhat less controlling.  Her therapist and her discussed issues of a tremor.  I shared with her that the Wellbutrin is unlikely to do this at this low dose but in reality she just recently in the last 2 weeks reduced it from 300 mg down to 150.  She also takes 15 mg of BuSpar in the morning which definitely would not produce a tremor.  She takes Restoril 15 mg 1 or 2 at night.  The patient patient drinks no alcohol.  She has some issues with migraine headaches.  I suspect they are more stress headaches as she has them all day long.  She is going to see her neurologist soon.  The patient is interested in getting back into the workforce but she does not know how to do it.  She does not know what she wants to do with herself.  Her court trial an issue simply keeps getting put off and apparently it will be resolved in May.  She does not seem to be worried about it very much. Associated Signs/Symptoms: Depression Symptoms:   (Hypo) Manic Symptoms:   Anxiety Symptoms:   Psychotic Symptoms:   PTSD Symptoms: NA  Past Psychiatric History:   Previous Psychotropic Medications: Yes   Substance Abuse History in the last 12 months:  Yes.    Consequences of Substance Abuse:   Past Medical History:  Past Medical  History:  Diagnosis Date   Anxiety    Chronic back pain    Depression    Gestational diabetes    glyburide   Headache    Hypertension    Idiopathic intracranial hypertension    Leukocytosis 02/25/2015   Obesity    Pregnancy induced hypertension    labetalol    Past Surgical History:  Procedure Laterality Date   CESAREAN SECTION N/A 01/29/2019   Procedure: CESAREAN SECTION;  Surgeon: Mitchel Honour, DO;  Location: [facility] LD [facility]S;  Service: Obstetrics;  Laterality: N/A;   CHOLECYSTECTOMY     LUMBAR PUNCTURE  2014   5x    Family Psychiatric History:   Family History:  Family History  Problem Relation Age of Onset   Hypertension Mother    Breast cancer Mother    Pancreatic cancer Mother    Diabetes Mother    Hypertension Father    Hypertension Maternal Grandmother    Heart disease Maternal Grandmother    Hypertension Maternal Grandfather    Diabetes Maternal Grandfather    Colon cancer Paternal Grandmother    Heart disease Paternal Grandfather    Hypertension Maternal Aunt    Breast cancer Maternal Aunt    Diabetes Maternal Aunt    Hypertension Maternal Uncle    Liver cancer Maternal Uncle    Prostate cancer Maternal Uncle    Miscarriages /  Stillbirths Daughter     Social History:   Social History   Socioeconomic History   Marital status: Married    Spouse name: Not on file   Number of children: 2   Years of education: Not on file   Highest education level: Associate degree: occupational, Scientist, product/process development, or vocational program  Occupational History   Occupation: Nurse  Tobacco Use   Smoking status: Never   Smokeless tobacco: Never  Vaping Use   Vaping status: Never Used  Substance and Sexual Activity   Alcohol use: Yes    Comment: socially   Drug use: No   Sexual activity: Not Currently    Birth control/protection: None  Other Topics Concern   Not on file  Social History Narrative   Not on file   Social Drivers of Health   Financial Resource Strain: Medium  Risk (11/20/2023)   Overall Financial Resource Strain (CARDIA)    Difficulty of Paying Living Expenses: Somewhat hard  Food Insecurity: No Food Insecurity (11/20/2023)   Hunger Vital Sign    Worried About Running Out of Food in the Last Year: Never true    Ran Out of Food in the Last Year: Never true  Transportation Needs: No Transportation Needs (11/20/2023)   PRAPARE - Administrator, Civil Service (Medical): No    Lack of Transportation (Non-Medical): No  Physical Activity: Insufficiently Active (11/20/2023)   Exercise Vital Sign    Days of Exercise per Week: 3 days    Minutes of Exercise per Session: 30 min  Stress: No Stress Concern Present (11/20/2023)   [employer] of Occupational Health - Occupational Stress Questionnaire    Feeling of Stress : Not at all  Social Connections: Socially Isolated (11/20/2023)   Social Connection and Isolation Panel [NHANES]    Frequency of Communication with Friends and Family: Once a week    Frequency of Social Gatherings with Friends and Family: Once a week    Attends Religious Services: Never    Database administrator or Organizations: No    Attends Engineer, structural: Not on file    Marital Status: Married    Additional Social History:   Allergies:   Allergies  Allergen Reactions   Elavil [Amitriptyline Hcl] Shortness Of Breath   Restoril [Temazepam] Shortness Of Breath   Trazodone And Nefazodone Anaphylaxis   Diamox [Acetazolamide] Other (See Comments)    Cold chills/loss of taste    Tomato Swelling and Other (See Comments)    Causes lips to swell   Norco [Hydrocodone-Acetaminophen] Itching   Pollen Extract Other (See Comments)    Unknown reaction    Benzocaine Swelling, Rash and Other (See Comments)    Localized swelling   Latex Rash   Novocain [Procaine] Swelling, Rash and Other (See Comments)    Localized swelling    Metabolic Disorder Labs: Lab Results  Component Value Date   HGBA1C 5.6 11/21/2023    MPG 122.63 05/15/2022   No results found for: "PROLACTIN" Lab Results  Component Value Date   CHOL 137 04/19/2023   TRIG 123.0 04/19/2023   HDL 58.20 04/19/2023   CHOLHDL 2 04/19/2023   VLDL 24.6 04/19/2023   LDLCALC 54 04/19/2023   LDLCALC 84 01/10/2023   Lab Results  Component Value Date   TSH 1.28 01/10/2023    Therapeutic Level Labs: No results found for: "LITHIUM" No results found for: "CBMZ" No results found for: "VALPROATE"  Current Medications: Current Outpatient Medications  Medication Sig Dispense  Refill   amLODipine (NORVASC) 5 MG tablet Take 1 tablet (5 mg total) by mouth daily. 90 tablet 1   Continuous Glucose Receiver (FREESTYLE LIBRE 3 READER) DEVI 1 each by Does not apply route See admin instructions. 2 each 3   Continuous Glucose Sensor (FREESTYLE LIBRE 3 SENSOR) MISC 1 each by Does not apply route See admin instructions. Place 1 sensor on the skin every 14 days. Use to check glucose continuously 2 each 3   esomeprazole (NEXIUM) 40 MG capsule Take 1 capsule (40 mg total) by mouth 2 (two) times daily before a meal. 60 capsule 5   ondansetron (ZOFRAN-ODT) 4 MG disintegrating tablet Take 1 tablet (4 mg total) by mouth every 8 (eight) hours as needed for nausea or vomiting. 20 tablet 0   SUMAtriptan (IMITREX) 50 MG tablet Take 1 tablet (50 mg total) by mouth every 2 (two) hours as needed for migraine. May repeat in 2 hours if headache persists or recurs. 10 tablet 0   tiZANidine (ZANAFLEX) 4 MG tablet TAKE ONE TABLET BY MOUTH THREE TIMES DAILY 30 tablet 1   triamterene-hydrochlorothiazide (MAXZIDE) 75-50 MG tablet TAKE 1 TABLET BY MOUTH EVERY DAY 90 tablet 1   atorvastatin (LIPITOR) 20 MG tablet TAKE ONE TABLET BY MOUTH EVERY DAY (Patient not taking: Reported on 12/19/2023) 90 tablet 1   buPROPion (WELLBUTRIN XL) 150 MG 24 hr tablet 1 qam  for 1 week then 2 qam 90 tablet 1   busPIRone (BUSPAR) 5 MG tablet 1 TID 270 tablet 1   fluconazole (DIFLUCAN) 150 MG tablet Take 1  tablet (150 mg total) by mouth every 3 (three) days as needed. (Patient not taking: Reported on 12/19/2023) 2 tablet 0   ibuprofen (ADVIL) 800 MG tablet Take 1 tablet (800 mg total) by mouth every 6 (six) hours as needed for moderate pain. (Patient not taking: Reported on 12/18/2023) 30 tablet 0   ondansetron (ZOFRAN) 4 MG tablet Take 1 tablet (4 mg total) by mouth every 6 (six) hours. (Patient not taking: Reported on 12/19/2023) 12 tablet 0   OZEMPIC, 1 MG/DOSE, 4 MG/3ML SOPN INJECT 1mg  SUBCUTANEOUSLY Once weekly (Patient not taking: Reported on 12/19/2023) 3 mL 2   potassium chloride SA (KLOR-CON M) 20 MEQ tablet Take 1 tablet (20 mEq total) by mouth 2 (two) times daily for 5 days. 10 tablet 0   temazepam (RESTORIL) 15 MG capsule 2 qhs 60 capsule 4   Vitamin D, Ergocalciferol, (DRISDOL) 1.25 MG (50000 UNIT) CAPS capsule Take 1 capsule (50,000 Units total) by mouth every 7 (seven) days. (Patient not taking: Reported on 12/19/2023) 12 capsule 0   No current facility-administered medications for this visit.    Musculoskeletal: Strength & Muscle Tone: within normal limits Gait & Station: normal Patient leans: N/A  Psychiatric Specialty Exam: Review of Systems  Blood pressure 116/77, pulse 81, height 5\' 1"  (1.549 m), weight 169 lb (76.7 kg).Body mass index is 31.93 kg/m.  General Appearance: Casual  Eye Contact:  Good  Speech:  NA  Volume:  Normal  Mood:  Anxious  Affect:  Appropriate  Thought Process:  Coherent  Orientation:  Full (Time, Place, and Person)  Thought Content:  WDL  Suicidal Thoughts:  No  Homicidal Thoughts:  No  Memory:  NA  Judgement:  Good  Insight:  Good  Psychomotor Activity:  Normal  Concentration:    Recall:  NA  Fund of Knowledge:Good  Language: Good  Akathisia:  NA  Handed:  Left  AIMS (if  indicated):  not done  Assets:  Desire for Improvement  ADL's:  Intact  Cognition: WNL  Sleep:  Poor   Screenings: GAD-7    Flowsheet Row Office Visit from 11/21/2023 in  [facility] [place] Office Visit from 07/23/2023 in [facility] [place] [facility] [place] Office Visit from 04/19/2023 in [facility] [place] [place] Office Visit from 02/21/2023 in [facility] [place] [facility] [place] Office Visit from 01/10/2023 in [facility] [place] [facility] [place]  Total GAD-7 Score 0 5 7 16 15       PHQ2-9    Flowsheet Row Office Visit from 11/21/2023 in [facility] [place] [facility] [place] Office Visit from 07/23/2023 in [facility] [place] [facility] [place] Office Visit from 04/19/2023 in [facility] [place] [facility] [place] Office Visit from 02/21/2023 in [facility] [place] [facility] [place] Office Visit from 01/10/2023 in [facility] [place]  PHQ-2 Total Score 0 0 2 6 4   PHQ-9 Total Score 0 0 6 18 18       Flowsheet Row ED from 05/22/2022 in [facility] Emergency Department at [facility] ED to Hosp-Admission (Discharged) from 05/14/2022 in [place] [facility]LOOR [facility] ED from 04/16/2021 in [facility] Emergency Department at [facility]  C-SSRS RISK CATEGORY No Risk No Risk No Risk       Assessment and Plan:      This patient's diagnosis is major depression in remission.  She will continue taking a low-dose of Wellbutrin 150 mg XL each morning.  She will return in 3 months and if she has a stable she is now we will likely discontinue the Wellbutrin completely.  After we do that then we will discontinue her BuSpar as well..  Her problems are major depression in remission and insomnia.  She takes Restoril 15 mg 1 or 2 at night for sleep.  The patient is doing very well.  She will continue in one-to-one therapy.  Collaboration of Care:   Patient/Guardian was advised Release of Information must be obtained prior to any record release in order to collaborate their care with an outside provider. Patient/Guardian was advised if they  have not already done so to contact the registration department to sign all necessary forms in order for [place] to release information regarding their care.   Consent: Patient/Guardian gives verbal consent for treatment and assignment of benefits for services provided during this visit. Patient/Guardian expressed understanding and agreed to proceed.   Gypsy Balsam, MD 4/2/20251:44 PM

## 2023-12-20 ENCOUNTER — Other Ambulatory Visit (HOSPITAL_COMMUNITY): Payer: Self-pay | Admitting: *Deleted

## 2023-12-20 MED ORDER — BUPROPION HCL ER (XL) 150 MG PO TB24: ORAL_TABLET | ORAL | 0 refills | Status: DC

## 2024-01-01 ENCOUNTER — Ambulatory Visit (INDEPENDENT_AMBULATORY_CARE_PROVIDER_SITE_OTHER): Admitting: Licensed Clinical Social Worker

## 2024-01-01 DIAGNOSIS — F330 Major depressive disorder, recurrent, mild: Secondary | ICD-10-CM | POA: Diagnosis not present

## 2024-01-02 ENCOUNTER — Encounter (HOSPITAL_COMMUNITY): Payer: Self-pay | Admitting: Licensed Clinical Social Worker

## 2024-01-02 NOTE — Progress Notes (Signed)
   THERAPIST PROGRESS NOTE  Session Time: 9:00am-9:55am  Participation Level: Active  Behavioral Response: Well GroomedAlertEuthymic  Type of Therapy: Individual Therapy  Treatment Goals addressed:  TG: Reduce frequency, intensity, and duration of depression symptoms so that daily functioning is improved (OP Depression)  Disciplines:  Interdisciplinary, PROVIDER    Expected end:  03/17/24          LTG: Increase coping skills to manage depression and improve ability to perform daily activities (OP Depression)  Disciplines:  Interdisciplinary, PROVIDER    Expected end:  03/17/24       ProgressTowards Goals: Progressing  Interventions: Motivational Interviewing  Summary: Tracey Beard is a 43 y.o. female who presents with MDD, recurrent, mild.   Suicidal/Homicidal: Nowithout intent/plan  Therapist Response: Tracey Beard engaged well in individual and person session with clinician.  Clinician utilized motivational interviewing to process thoughts feelings and interactions.  Clinician identified improvement in relationships and ongoing assessment and evaluation of friendships.  Clinician explored the impact of politics on relationships with family.  Tracey Beard shared some challenges with parents who have different political leanings.  Clinician processed ways to cope with these challenges.  Clinician explored Tracey Beard's ability to compartmentalize and separate the person from the voting record. Clinician explored trauma symptoms and grief.  Tracey Beard shared her mood has been stabilized and she feels more confident in herself.  Plan: Return again in 2 weeks.  Diagnosis: MDD (major depressive disorder), recurrent episode, mild (HCC)  Collaboration of Care: Psychiatrist AEB reviewed Dr. Ane Banter note  Patient/Guardian was advised Release of Information must be obtained prior to any record release in order to collaborate their care with an outside provider. Patient/Guardian was advised if they  have not already done so to contact the registration department to sign all necessary forms in order for us  to release information regarding their care.   Consent: Patient/Guardian gives verbal consent for treatment and assignment of benefits for services provided during this visit. Patient/Guardian expressed understanding and agreed to proceed.   Tracey Stare [place], LCSW 01/02/2024

## 2024-01-03 ENCOUNTER — Other Ambulatory Visit: Payer: Self-pay | Admitting: Internal Medicine

## 2024-01-03 DIAGNOSIS — E1169 Type 2 diabetes mellitus with other specified complication: Secondary | ICD-10-CM

## 2024-01-08 ENCOUNTER — Ambulatory Visit (HOSPITAL_COMMUNITY): Payer: No Typology Code available for payment source | Admitting: Psychiatry

## 2024-01-09 ENCOUNTER — Ambulatory Visit (HOSPITAL_COMMUNITY): Payer: No Typology Code available for payment source | Admitting: Psychiatry

## 2024-01-15 ENCOUNTER — Other Ambulatory Visit: Payer: Self-pay | Admitting: Internal Medicine

## 2024-01-15 ENCOUNTER — Ambulatory Visit (HOSPITAL_COMMUNITY): Admitting: Licensed Clinical Social Worker

## 2024-01-15 ENCOUNTER — Encounter (HOSPITAL_COMMUNITY): Payer: Self-pay | Admitting: Licensed Clinical Social Worker

## 2024-01-15 DIAGNOSIS — R5383 Other fatigue: Secondary | ICD-10-CM

## 2024-01-15 DIAGNOSIS — F330 Major depressive disorder, recurrent, mild: Secondary | ICD-10-CM | POA: Diagnosis not present

## 2024-01-15 NOTE — Progress Notes (Signed)
   THERAPIST PROGRESS NOTE  Session Time: 9:00am-9:45am  Participation Level: Active  Behavioral Response: NeatAlertEuthymic and Irritable  Type of Therapy: Individual Therapy  Treatment Goals addressed:  TG: Reduce frequency, intensity, and duration of depression symptoms so that daily functioning is improved (OP Depression)  Disciplines:  Interdisciplinary, PROVIDER    Expected end:  03/17/24          LTG: Increase coping skills to manage depression and improve ability to perform daily activities (OP Depression)  Disciplines:  Interdisciplinary, PROVIDER    Expected end:  03/17/24       ProgressTowards Goals: Progressing  Interventions: CBT  Summary: Tracey Beard is a 43 y.o. female who presents with MDD, recurrent, mild.   Suicidal/Homicidal: Nowithout intent/plan  Therapist Response: [name] engaged well in individual in person session with clinician. Clinician utilized CBT to process thoughts, feelings, and interactions. [name] shared a triggering experience with husband as related to his attitude and bx with her son. Clinician processed this experience and explored the true feelings felt by [name], which included disappointment and hurt for her son. Clinician reflected that this triggered old feelings from years past when husband was not physically or emotionally present for her. Clinician utilized reality testing about her expectations and reminded [name] that she is all she needs for support and positivity. Clinician also noted that son's attachment to [name] is real and notable, as compared to his attachment to his father.  [name] shared a recent trip to [facility] to see her mother's grave. She reported that she felt very positive about the trip and noted that she was able to release a lot of feelings she was holding in. [name] shared that she no longer feels afraid to feel or afraid of the feelings she feels.   Plan: Return again in 2-3 weeks.  Diagnosis: MDD (major  depressive disorder), recurrent episode, mild (HCC)  Collaboration of Care: Psychiatrist AEB provided update to Dr. Levie Ream  Patient/Guardian was advised Release of Information must be obtained prior to any record release in order to collaborate their care with an outside provider. Patient/Guardian was advised if they have not already done so to contact the registration department to sign all necessary forms in order for us  to release information regarding their care.   Consent: Patient/Guardian gives verbal consent for treatment and assignment of benefits for services provided during this visit. Patient/Guardian expressed understanding and agreed to proceed.   [name], LCSW 01/15/2024

## 2024-01-21 ENCOUNTER — Other Ambulatory Visit: Payer: Self-pay | Admitting: *Deleted

## 2024-01-21 MED ORDER — TRIAMTERENE-HCTZ 75-50 MG PO TABS: 1.0000 | ORAL_TABLET | Freq: Every day | ORAL | 1 refills | Status: DC

## 2024-02-05 ENCOUNTER — Encounter: Payer: Self-pay | Admitting: Neurology

## 2024-02-05 ENCOUNTER — Ambulatory Visit (INDEPENDENT_AMBULATORY_CARE_PROVIDER_SITE_OTHER): Admitting: Neurology

## 2024-02-05 ENCOUNTER — Ambulatory Visit (INDEPENDENT_AMBULATORY_CARE_PROVIDER_SITE_OTHER): Admitting: Licensed Clinical Social Worker

## 2024-02-05 ENCOUNTER — Telehealth: Payer: Self-pay | Admitting: Neurology

## 2024-02-05 VITALS — BP 108/74 | HR 90 | Ht 60.0 in | Wt 174.0 lb

## 2024-02-05 DIAGNOSIS — G44019 Episodic cluster headache, not intractable: Secondary | ICD-10-CM | POA: Diagnosis not present

## 2024-02-05 DIAGNOSIS — F330 Major depressive disorder, recurrent, mild: Secondary | ICD-10-CM

## 2024-02-05 DIAGNOSIS — G43701 Chronic migraine without aura, not intractable, with status migrainosus: Secondary | ICD-10-CM | POA: Insufficient documentation

## 2024-02-05 DIAGNOSIS — G932 Benign intracranial hypertension: Secondary | ICD-10-CM | POA: Diagnosis not present

## 2024-02-05 DIAGNOSIS — G44219 Episodic tension-type headache, not intractable: Secondary | ICD-10-CM | POA: Insufficient documentation

## 2024-02-05 DIAGNOSIS — G4440 Drug-induced headache, not elsewhere classified, not intractable: Secondary | ICD-10-CM | POA: Insufficient documentation

## 2024-02-05 MED ORDER — CYCLOBENZAPRINE HCL 5 MG PO TABS: 5.0000 mg | ORAL_TABLET | Freq: Three times a day (TID) | ORAL | 1 refills | Status: DC | PRN

## 2024-02-05 MED ORDER — PROPRANOLOL HCL ER 80 MG PO CP24: 80.0000 mg | ORAL_CAPSULE | Freq: Every day | ORAL | 1 refills | Status: DC

## 2024-02-05 MED ORDER — AJOVY 225 MG/1.5ML ~~LOC~~ SOAJ: 225.0000 mg | SUBCUTANEOUS | 5 refills | Status: AC

## 2024-02-05 MED ORDER — AJOVY 225 MG/1.5ML ~~LOC~~ SOAJ: 225.0000 mg | Freq: Once | SUBCUTANEOUS | Status: DC

## 2024-02-05 MED ORDER — AJOVY 225 MG/1.5ML ~~LOC~~ SOAJ: 225.0000 mg | Freq: Once | SUBCUTANEOUS | Status: AC

## 2024-02-05 NOTE — Telephone Encounter (Signed)
 sent to [facility] they obtain Drucie Opitz [phone]

## 2024-02-05 NOTE — Addendum Note (Signed)
 Addended by: Elton Ham on: 02/05/2024 10:28 AM   Modules accepted: Orders

## 2024-02-05 NOTE — Patient Instructions (Signed)
 Cyclobenzaprine  Tablets What is this medication? CYCLOBENZAPRINE  (sye kloe BEN za preen) treats muscle spasms. It works by relaxing your muscles, which reduces muscle stiffness. It belongs to a group of medications called muscle relaxants. This medicine may be used for other purposes; ask your health care provider or pharmacist if you have questions. COMMON BRAND NAME(S): Fexmid , Flexeril  What should I tell my care team before I take this medication? They need to know if you have any of these conditions: Heart disease, irregular heartbeat, or previous heart attack Liver disease Thyroid  problem An unusual or allergic reaction to cyclobenzaprine , tricyclic antidepressants, lactose, other medications, foods, dyes, or preservatives Pregnant or trying to get pregnant Breastfeeding How should I use this medication? Take this medication by mouth with a glass of water. Take it as directed on the prescription label. You can take it with or without food. If it upsets your stomach, take it with food. Do not take it more often than directed. Talk to your care team about the use of this medication in children. Special care may be needed. Overdosage: If you think you have taken too much of this medicine contact a poison control center or emergency room at once. NOTE: This medicine is only for you. Do not share this medicine with others. What if I miss a dose? If you miss a dose, take it as soon as you can. If it is almost time for your next dose, take only that dose. Do not take double or extra doses. What may interact with this medication? Do not take this medication with any of the following: MAOIs, such as Carbex, Eldepryl, Marplan, Nardil, and Parnate Opioid medications for cough Safinamide This medication may also interact with the following: Alcohol Bupropion  Antihistamines for allergy, cough, and cold Certain medications for anxiety or sleep Certain medications for bladder problems, such as  oxybutynin, tolterodine Certain medications for depression, such as amitriptyline, fluoxetine, sertraline  Certain medications for Parkinson disease, such as benztropine, trihexyphenidyl Certain medications for seizures, such as phenobarbital, primidone Certain medications for stomach problems, such as dicyclomine , hyoscyamine Certain medications for travel sickness, such as scopolamine  General anesthetics, such as halothane, isoflurane, methoxyflurane, propofol Ipratropium Local anesthetics, such as lidocaine , pramoxine, tetracaine Medications that relax muscles for surgery Opioid medications for pain Phenothiazines, such as chlorpromazine, mesoridazine, prochlorperazine , thioridazine Verapamil This list may not describe all possible interactions. Give your health care provider a list of all the medicines, herbs, non-prescription drugs, or dietary supplements you use. Also tell them if you smoke, drink alcohol, or use illegal drugs. Some items may interact with your medicine. What should I watch for while using this medication? Visit your care team for regular checks on your progress. Tell your care team if your symptoms do not start to get better or if they get worse. This medication may affect your coordination, reaction time, or judgment. Do not drive or operate machinery until you know how this medication affects you. Sit up or stand slowly to reduce the risk of dizzy or fainting spells. Drinking alcohol with this medication can increase the risk of these side effects. Taking this medication with other substances that cause drowsiness, such as alcohol, benzodiazepines, or other opioids can cause serious side effects. Give your care team a list of all medications you use. They will tell you how much medication to take. Do not take more medication than directed. Call emergency services if you have problems breathing or staying awake. Your mouth may get dry. Chewing sugarless gum or sucking  hard  candy and drinking plenty of water may help. Contact your care team if the problem does not go away or is severe. What side effects may I notice from receiving this medication? Side effects that you should report to your care team as soon as possible: Allergic reactions--skin rash, itching, hives, swelling of the face, lips, tongue, or throat CNS depression--slow or shallow breathing, shortness of breath, feeling faint, dizziness, confusion, trouble staying awake Heart rhythm changes--fast or irregular heartbeat, dizziness, feeling faint or lightheaded, chest pain, trouble breathing Side effects that usually do not require medical attention (report to your care team if they continue or are bothersome): Constipation Dizziness Drowsiness Dry mouth Fatigue Nausea This list may not describe all possible side effects. Call your doctor for medical advice about side effects. You may report side effects to FDA at 1-800-FDA-1088. Where should I keep my medication? Keep out of the reach of children and pets. Store at room temperature between 20 and 25 degrees C (68 and 77 degrees F). Keep container tightly closed. Get rid of any unused medication after the expiration date. To get rid of medications that are no longer needed or have expired: Take the medication to a medication take-back program. Check with your pharmacy or law enforcement to find a location. If you cannot return the medication, check the label or package insert to see if the medication should be thrown out in the garbage or flushed down the toilet. If you are not sure, ask your care team. If it is safe to put it in the trash, empty the medication out of the container. Mix the medication with cat litter, dirt, coffee grounds, or other unwanted substance. Seal the mixture in a bag or container. Put it in the trash. NOTE: This sheet is a summary. It may not cover all possible information. If you have questions about this medicine, talk to your  doctor, pharmacist, or health care provider.  2024 Elsevier/Gold Standard (2022-03-10 00:00:00)    Propranolol Tablets What is this medication? PROPRANOLOL (proe PRAN oh lole) treats many conditions such as high blood pressure, tremors, and a type of arrhythmia known as AFib (atrial fibrillation). It works by lowering your blood pressure and heart rate, making it easier for your heart to pump blood to the rest of your body. It may be used to prevent migraine headaches. It works by relaxing the blood vessels in the brain that cause migraines. It belongs to a group of medications called beta blockers. This medicine may be used for other purposes; ask your health care provider or pharmacist if you have questions. COMMON BRAND NAME(S): Inderal What should I tell my care team before I take this medication? They need to know if you have any of these conditions: Diabetes Having surgery Heart or blood vessel conditions, such as slow heartbeat, heart failure, heart block Kidney disease Liver disease Lung or breathing disease, such as asthma or COPD Myasthenia gravis Pheochromocytoma Thyroid  disease An unusual or allergic reaction to propranolol, other medications, foods, dyes, or preservatives Pregnant or trying to get pregnant Breastfeeding How should I use this medication? Take this medication by mouth. Take it as directed on the prescription label at the same time every day. Keep taking it unless your care team tells you to stop. Talk to your care team about the use of this medication in children. Special care may be needed. Overdosage: If you think you have taken too much of this medicine contact a poison control center or emergency  room at once. NOTE: This medicine is only for you. Do not share this medicine with others. What if I miss a dose? If you miss a dose, take it as soon as you can. If it is almost time for your next dose, take only that dose. Do not take double or extra  doses. What may interact with this medication? Do not take this medication with any of the following: Thioridazine This medication may also interact with the following: Certain medications for blood pressure, heart disease, irregular heartbeat Epinephrine NSAIDs, medications for pain and inflammation, such as ibuprofen  or naproxen Warfarin Other medications may affect the way this medication works. Talk with your care team about all of the medications you take. They may suggest changes to your treatment plan to lower the risk of side effects and to make sure your medications work as intended. This list may not describe all possible interactions. Give your health care provider a list of all the medicines, herbs, non-prescription drugs, or dietary supplements you use. Also tell them if you smoke, drink alcohol, or use illegal drugs. Some items may interact with your medicine. What should I watch for while using this medication? Visit your care team for regular checks on your progress. Check your blood pressure as directed. Know what your blood pressure should be and when to contact your care team. This medication may affect your coordination, reaction time, or judgment. Do not drive or operate machinery until you know how this medication affects you. Sit up or stand slowly to reduce the risk of dizzy or fainting spells. Drinking alcohol with this medication can increase the risk of these side effects. Do not suddenly stop taking this medication. This may increase your risk of side effects, such as chest pain and heart attack. If you no longer need to take this medication, your care team will lower the dose slowly over time to decrease the risk of side effects. If you are going to need surgery or a procedure, tell your care team that you are using this medication. This medication may affect blood glucose levels. It can also mask the symptoms of low blood sugar, such as a rapid heartbeat and tremors. If  you have diabetes, it is important to check your blood sugar often while you are taking this medication. Do not treat yourself for coughs, colds, or pain while you are using this medication without asking your care team for advice. Some medications may increase your blood pressure. What side effects may I notice from receiving this medication? Side effects that you should report to your care team as soon as possible: Allergic reactions--skin rash, itching, hives, swelling of the face, lips, tongue, or throat Heart failure--shortness of breath, swelling of the ankles, feet, or hands, sudden weight gain, unusual weakness or fatigue Low blood pressure--dizziness, feeling faint or lightheaded, blurry vision Raynaud's--cool, numb, or painful fingers or toes that may change color from pale, to blue, to red Redness, blistering, peeling, or loosening of the skin, including inside the mouth Slow heartbeat--dizziness, feeling faint or lightheaded, confusion, trouble breathing, unusual weakness or fatigue Worsening mood, feelings of depression Side effects that usually do not require medical attention (report to your care team if they continue or are bothersome): Change in sex drive or performance Diarrhea Dizziness Fatigue Headache This list may not describe all possible side effects. Call your doctor for medical advice about side effects. You may report side effects to FDA at 1-800-FDA-1088. Where should I keep my medication? Keep  out of the reach of children and pets. Store at room temperature between 20 and 25 degrees C (68 and 77 degrees F). Protect from light. Throw away any unused medication after the expiration date. NOTE: This sheet is a summary. It may not cover all possible information. If you have questions about this medicine, talk to your doctor, pharmacist, or health care provider.  2024 Elsevier/Gold Standard (2022-09-04 00:00:00) Analgesic Rebound Headache An analgesic rebound headache  is a secondary disorder that is caused by the overuse of pain medicine (analgesic) to treat the original (primary) headache. It is sometimes called a medication overuse headache or a drug-induced headache. Any type of primary headache can return as a rebound headache if a person regularly takes pain medicine. The types of primary headaches that are commonly related to rebound headaches include: Migraines. Tension headaches. These are caused by tense muscles in the head and neck area. Cluster headaches. These happen on one side of the head and around the eye. If rebound headaches continue, they can become long-term, daily headaches. What are the causes? Rebound headaches may be caused by frequent use of: Over-the-counter medicines, such as aspirin, ibuprofen , and acetaminophen . Sinus-relief medicines. Medicines that contain caffeine. Narcotic pain medicines, such as codeine and oxycodone . Some prescription migraine medicines. What are the signs or symptoms? The symptoms of a rebound headache are the same as the symptoms of the primary headache. Symptoms of specific types of headaches include: Migraine headache Pulsing or throbbing pain on one or both sides of the head. Severe pain that makes it hard to do daily activities. Pain that gets worse with physical activity. Nausea, vomiting, or both. Pain that may get worse around bright lights, loud noises, or smells. Vision changes. Numbness in one or both arms. Tension headache Pressure around the head. Dull, aching head pain. Pain felt over the front and sides of the head. Tenderness in the muscles of the head, neck, and shoulders. Cluster headache Severe pain that begins in or around one eye or temple. Droopy or swollen eyelid, or redness and tearing in the eye on the same side as the pain. One-sided head pain. Nausea. Runny nose. Sweaty, pale facial skin. Restlessness. How is this diagnosed? Rebound headaches are diagnosed by  reviewing: Your medical history, including a description of your primary headaches. Your pain medicines for your primary headaches and how often you take them. How is this treated? Rebound headaches may be treated or managed by: Stopping frequent use of pain medicine. This may make your headaches worse at first, but the pain should then become less manageable and less frequent and severe. Seeing a headache specialist. They may be able to help you manage your headaches and help make sure there is not another cause of the headaches. Using methods of stress relief, such as acupuncture, counseling, biofeedback, and massage. Follow these instructions at home: Medicines  Take over-the-counter and prescription medicines only as told by your health care provider. Stop the repeated use of pain medicine as told by your provider. Stopping can be hard. Carefully follow instructions from your provider. Lifestyle Do not drink alcohol. Do not use any products that contain nicotine or tobacco. These products include cigarettes, chewing tobacco, and vaping devices, such as e-cigarettes. If you need help quitting, ask your provider. Get 7-9 hours of sleep each night, or the amount recommended by your provider. Find ways to manage stress, such as acupuncture, counseling, biofeedback, and massage. Exercise regularly. Exercise for at least 30 minutes, 5 times each  week. General instructions Avoid things that may bring on (trigger) your primary headaches, such as certain foods. Contact a health care provider if: Medicine does not help your migraine. Your pain keeps coming back even with medicine. Get help right away if: You have new headache pain. You have headache pain that is different than what you have felt in the past. You have numbness or tingling in your arms or legs. You have changes in your speech or vision. This information is not intended to replace advice given to you by your health care provider.  Make sure you discuss any questions you have with your health care provider. Document Revised: 05/01/2022 Document Reviewed: 05/01/2022 Elsevier Patient Education  2024 [employer] Inc.    Idiopathic Intracranial Hypertension  Idiopathic intracranial hypertension (IIH) is a condition that increases pressure around the brain. The fluid that surrounds the brain and spinal cord (cerebrospinal fluid, or CSF) increases and causes the pressure. Idiopathic means that the cause of this condition is not known. IIH affects the brain and spinal cord. If this condition is not treated, it can cause vision loss or blindness. What are the causes? The cause of this condition is not known. What increases the risk? The following factors may make you more likely to develop this condition: Being obese. Being a person who is female, between the ages of 71 and 43 years old, and who has not gone through menopause. Taking certain medicines, such as birth control, acne medicines, or steroids. What are the signs or symptoms? Symptoms of this condition include: Headaches. This is the most common symptom. Brief periods of total blindness. Double vision, blurred vision, or poor side (peripheral) vision. Pain in the shoulders or neck. Nausea and vomiting. A sound like rushing water or a pulsing sound within the ears (pulsatile tinnitus), or ringing in the ears. How is this diagnosed? This condition may be diagnosed based on: Your symptoms and medical history. Imaging tests of the brain, such as: CT scan. MRI. Magnetic resonance venogram (MRV) to check the veins. Diagnostic lumbar puncture. This is a procedure to remove and examine a sample of CSF. This procedure can determine whether your fluid pressure is too high. An eye exam to check for swelling or nerve damage in the eyes. How is this treated? Treatment for this condition depends on the symptoms. The goal of treatment is to decrease the pressure around your  brain. Common treatments include: Weight loss through healthy eating, salt restriction, and exercise, if you are overweight. Medicines to decrease the production of CSF and lower the pressure within your skull. Medicines to prevent or treat headaches. Other treatments may include: Surgery to place drains (shunts) in your brain to remove extra fluid. Lumbar puncture to remove extra CSF. Follow these instructions at home: If you are overweight or obese, work with your health care provider to lose weight. Take over-the-counter and prescription medicines only as told by your health care provider. Ask your health care provider if the medicine prescribed to you requires you to avoid driving or using machinery. Do not use any products that contain nicotine or tobacco. These products include cigarettes, chewing tobacco, and vaping devices, such as e-cigarettes. If you need help quitting, ask your health care provider. Keep all follow-up visits. Your health care provider will need to monitor you regularly. Contact a health care provider if: You have changes in your vision, such as: Double vision. Blurred vision. Poor peripheral vision. Get help right away if: You have any of the  following symptoms and they get worse or do not get better: Headaches. Nausea. Vomiting. Sudden trouble seeing. This information is not intended to replace advice given to you by your health care provider. Make sure you discuss any questions you have with your health care provider. Document Revised: 01/31/2022 Document Reviewed: 01/10/2022 Elsevier Patient Education  2024 [employer] Inc.   ASSESSMENT AND PLAN:  43 y.o. year old female here with:  History of analgesic overuse and migraine , see above named 5 types of her headaches:     1) several types of headaches - some daily , same monthly, all of them affecting her more often in spring and summer ( seasonal). Tension headaches may respond to a muscle relaxer, she  is on tizanidine  with little  success. I exchanged for Flexaril, 5 mg at night.   2) Migraine and cluster type - Imitrex  causes her neck and jaw to tighten,an unpleasant sensation.  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   I would not use Topiramate for that reason( word finding difficulties )  and the possible dyseasthesias, but like to add propanolol ER at night po. Aaron Aas    3) morning headaches , could be related to sleep apnea or hypoxia. She had a futile sleep study due to Insomnia in the past.  I will order a HST as a screening test.  4)  history of pseudotumor cerebri-  her main risk factor was BMI, she lost 60 pounds on Mounjaro now and this may have already reduced her pressure.  She had several LP before she lost much weight ( about 8 years ago) .No new MRI since ?  I will order a brain MRI.    5) reduce the OTC daily use for prevention of rebound headaches- I added a patient education to this consultation note.    I plan to follow up either personally or through our NP within 4-5 months.    Please keep a headache journal.

## 2024-02-05 NOTE — Progress Notes (Signed)
 SLEEP [facility]    Provider:  Neomia Banner, MD  Primary Care Physician:  Zilphia Hilt, Charyl Coppersmith, MD [address] [place] [address]     Referring Provider: Zilphia Hilt, Charyl Coppersmith, Md [address] [place],  [place] [address]          Chief Complaint according to patient   Patient presents with:     New Patient (Initial Visit)           HISTORY OF PRESENT ILLNESS:  Tracey Beard is a 43 y.o. female patient who is seen upon a new referral on 02/05/2024 from Dr Ival MarinesGalvin Jules  for a new  Headache evaluation (that will over lap with a sleep consult.). when we met for the first time , that consult was directed to insomnia.  I have the pleasure of seeing Tracey Beard 02/05/24 a right-handed AA female with a multifaceted headache disorder.  Chief concern according to patient : " I have daily headaches".   We discussed the types of headaches she presents with today:    1) Tracey Beard tells me that every day she wakes up with a mild headache that may resolve by itself but more often turns into a more severe headache by about 5 PM.    2) The more severe headache is reminiscent of a migraine, comes with nausea but rarely vomiting, it also comes with some blurred vision.  She denies having a visual aura but she has photophobia.  She does not like to move a lot when she has these type of headaches.  3) the third type is a sharp pain that can wake her up out of sleep and this has become rare however it seems to be a classic cluster manifestation.  It is a stabbing or piercing pain and usually is felt at the right temple and sometimes at the area above the ear and behind the ear.  #4 is a pressure headache that can happen at random times and does remind her a bit of the headaches she used to have when she was diagnosed with intracranial hypertension.    And #5 is more of a tension headache behind the right ear radiating towards the neck and seems to  arise from a trigger point at the occipital notch.   Headaches may be fed by OTC analgesics, she takes these daily. Headaches started in her twenties.  There is a positive family history for migraine headaches in her mother, starting in her early twenties.  She also noted a seasonal return to mire HA in spring and in summer without a a clear hay-fever component.     Tracey Beard is a now 43 y.o. female patient who was seen for the first upon referral on 02/06/2023 from Dr Zilphia Hilt, MD for a sleep evaluation.  She had just been just diagnosed with DM2, recent eye exam with dr Candi Chafe. was normal.  Meanwhile HbA1c is at 5.1.  She has been Ozempic .   Chief concern according to patient :  " chronic insomnia for years, affecting my mental health"     Tracey Beard was seen on 02/06/23, she is the mother of a now just 51  year old son and has tried all kinds of  insomnia OTC aids. She was referred to psychiatry after our consult and has felt it made a lot of difference.  She carried a diagnosis of  PTSD, 2018 , anxiety and panic attacks have improved.  Physical  exercise and meditation. Therapy . Sleeping now much better.      She  had in the past  a paradoxical response to melatonin, and is allergic to trazodone. She gets cluster migraines. She was on Topiramate and Maxalt - both gave her chest tightness. Anxiety. She is taking tizanidine . Failed zoloft  and cymbalta, has not been seen by mental health-    The patient is here to be evaluated for an organic sleep disorder. She had a sleep study in [place] , she didn't sleep enough to be valid.    Sleep relevant medical history: insomnia since 2012-2014 . Was hallucinating after not sleeping at all for 3 days,  urgent care gave acute relief: benzodiazepine.  Sleeping not all for some nights, often  not sleeping before son is ready to go to school. Then she may sleep a couple of hours, chronic pain.  PTSD - daytime flash backs, vivid dreams,  racing mind , wisdom teeth extraction. Bruxism. Was a third shift worker.     Tracey Beard. 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  She has been prescribed Maxide for hypertension but has not been taking it.  She does not smoke, she does not drink, she has had a C-section and a cholecystectomy.   She has been complaining of a lot of insomnia and fatigue.  She has not seen an eye doctor in years.  She has neck and shoulder spasms and is requesting a refill of tizanidine .  She gets her methadone at [facility].   Blood pressure is noted to be elevated today.     Family medical /sleep history: No other family member on CPAP with OSA, with insomnia, with sleep walking.    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  she had been a third shift worker for years.   She lives in a household with husband and son. Husband is concerned about her tension and mental health.  The patient  used to work in shifts( night/ rotating,) Tobacco use; none.  ETOH use ; none,  Caffeine intake in form of Coffee( 1 cup every other day) Soda( rare)  Tea ( /) no energy drinks.   She takes Excedrin migraine very frequently , alternating with Ibuprofen  and tylenol .    Review of Systems: Out of a complete 14 system review, the patient complains of only the following symptoms, and all other reviewed systems are negative.:    Tension, trigger, migraine, cluster , analgesic rebound.    Headaches that are present in Am, HA that wake her out of sleep,  trigger point of pain right temple and right retroauricular point.     How likely are you to doze in the following situations: 0 = not likely, 1 = slight chance, 2 = moderate chance, 3 = high chance   Sitting  and Reading? Watching Television? Sitting inactive in a public place (theater or meeting)? As a passenger in a car for an hour without a break? Lying down in the afternoon when circumstances permit? Sitting and talking to someone? Sitting quietly after lunch without alcohol? In a car, while stopped for a few minutes in traffic?   Total = 3/ 24 points   FSS endorsed at 13/ 63 points.  Significant decrease form 63/ 63 in 2024.  Social History   Socioeconomic History   Marital status: Married    Spouse name: Not on file   Number of children: 2   Years of education: Not on file   Highest education level: Associate degree: occupational, Scientist, product/process development, or vocational program  Occupational History   Occupation: Nurse  Tobacco Use   Smoking status: Never   Smokeless tobacco: Never  Vaping Use   Vaping status: Never Used  Substance and Sexual Activity   Alcohol use: Yes    Comment: socially   Drug use: No   Sexual activity: Not Currently    Birth control/protection: None  Other Topics Concern   Not on file  Social History Narrative   Not on file   Social Drivers of Health   Financial Resource Strain: Medium Risk (11/20/2023)   Overall Financial Resource Strain (CARDIA)    Difficulty of Paying Living Expenses: Somewhat hard  Food Insecurity: No Food Insecurity (11/20/2023)   Hunger Vital Sign    Worried About Running Out of Food in the Last Year: Never true    Ran Out of Food in the Last Year: Never true  Transportation Needs: No Transportation Needs (11/20/2023)   PRAPARE - Administrator, Civil Service (Medical): No    Lack of Transportation (Non-Medical): No  Physical Activity: Insufficiently Active (11/20/2023)   Exercise Vital Sign    Days of Exercise per Week: 3 days    Minutes of Exercise per Session: 30 min  Stress: No Stress Concern Present (11/20/2023)   [employer] of Occupational Health - Occupational Stress Questionnaire    Feeling of Stress : Not at all   Social Connections: Socially Isolated (11/20/2023)   Social Connection and Isolation Panel [NHANES]    Frequency of Communication with Friends and Family: Once a week    Frequency of Social Gatherings with Friends and Family: Once a week    Attends Religious Services: Never    Database administrator or Organizations: No    Attends Engineer, structural: Not on file    Marital Status: Married    Family History  Problem Relation Age of Onset   Hypertension Mother    Breast cancer Mother    Pancreatic cancer Mother    Diabetes Mother    Hypertension Father    Hypertension Maternal Grandmother    Heart disease Maternal Grandmother    Hypertension Maternal Grandfather    Diabetes Maternal Grandfather    Colon cancer Paternal Grandmother    Heart disease Paternal Grandfather    Hypertension Maternal Aunt    Breast cancer Maternal Aunt    Diabetes Maternal Aunt    Hypertension Maternal Uncle    Liver cancer Maternal Uncle    Prostate cancer Maternal Uncle    Miscarriages / Stillbirths Daughter     Past Medical History:  Diagnosis Date   Anxiety    Chronic back pain    Depression    Gestational diabetes    glyburide    Headache    Hypertension    Idiopathic intracranial hypertension    Leukocytosis 02/25/2015   Obesity    Pregnancy induced hypertension    labetalol     Past Surgical History:  Procedure Laterality Date   CESAREAN SECTION N/A 01/29/2019   Procedure: CESAREAN SECTION;  Surgeon: Dyanna Glasgow, DO;  Location: [facility] LD ORS;  Service: Obstetrics;  Laterality: N/A;   CHOLECYSTECTOMY     LUMBAR PUNCTURE  2014   5x     Current Outpatient  Medications on File Prior to Visit  Medication Sig Dispense Refill   buPROPion  (WELLBUTRIN  XL) 150 MG 24 hr tablet 1 qam  for 1 week then 2 qam (Patient taking differently: Take 150 mg by mouth daily.) 180 tablet 0   busPIRone  (BUSPAR ) 5 MG tablet 1 TID (Patient taking differently: Take 5 mg by mouth daily.) 270 tablet 1    Continuous Glucose Receiver (FREESTYLE LIBRE 3 READER) DEVI 1 each by Does not apply route See admin instructions. 2 each 3   Continuous Glucose Sensor (FREESTYLE LIBRE 3 SENSOR) MISC 1 each by Does not apply route See admin instructions. Place 1 sensor on the skin every 14 days. Use to check glucose continuously 2 each 3   esomeprazole  (NEXIUM ) 40 MG capsule Take 1 capsule (40 mg total) by mouth 2 (two) times daily before a meal. 60 capsule 5   fluconazole  (DIFLUCAN ) 150 MG tablet Take 1 tablet (150 mg total) by mouth every 3 (three) days as needed. 2 tablet 0   ibuprofen  (ADVIL ) 800 MG tablet Take 1 tablet (800 mg total) by mouth every 6 (six) hours as needed for moderate pain. 30 tablet 0   ondansetron  (ZOFRAN ) 4 MG tablet Take 1 tablet (4 mg total) by mouth every 6 (six) hours. 12 tablet 0   ondansetron  (ZOFRAN -ODT) 4 MG disintegrating tablet Take 1 tablet (4 mg total) by mouth every 8 (eight) hours as needed for nausea or vomiting. 20 tablet 0   OZEMPIC , 1 MG/DOSE, 4 MG/3ML SOPN INJECT 1mg  SUBCUTANEOUSLY Once weekly 3 mL 2   temazepam  (RESTORIL ) 15 MG capsule 2 qhs 60 capsule 4   tiZANidine  (ZANAFLEX ) 4 MG tablet TAKE ONE TABLET BY MOUTH THREE TIMES DAILY 30 tablet 1   triamterene -hydrochlorothiazide  (MAXZIDE ) 75-50 MG tablet Take 1 tablet by mouth daily. 90 tablet 1   No current facility-administered medications on file prior to visit.    Allergies  Allergen Reactions   Elavil [Amitriptyline Hcl] Shortness Of Breath   Restoril  [Temazepam ] Shortness Of Breath   Trazodone And Nefazodone Anaphylaxis   Diamox [Acetazolamide] Other (See Comments)    Cold chills/loss of taste    Tomato Swelling and Other (See Comments)    Causes lips to swell   Norco [Hydrocodone -Acetaminophen ] Itching   Pollen Extract Other (See Comments)    Unknown reaction    Benzocaine Swelling, Rash and Other (See Comments)    Localized swelling   Latex Rash   Novocain [Procaine] Swelling, Rash and Other (See  Comments)    Localized swelling     DIAGNOSTIC DATA (LABS, IMAGING, TESTING) - I reviewed patient records, labs, notes, testing and imaging myself where available.  Lab Results  Component Value Date   WBC 10.3 01/10/2023   HGB 14.7 01/10/2023   HCT 43.2 01/10/2023   MCV 86.8 01/10/2023   PLT 253.0 01/10/2023      Component Value Date/Time   NA 136 04/26/2023 0752   K 3.5 04/26/2023 0752   CL 99 04/26/2023 0752   CO2 26 04/26/2023 0752   GLUCOSE 112 (H) 04/26/2023 0752   BUN 11 04/26/2023 0752   CREATININE 0.86 04/26/2023 0752   CALCIUM  10.1 04/26/2023 0752   PROT 8.6 (H) 04/19/2023 0725   ALBUMIN 5.2 04/19/2023 0725   AST 26 04/19/2023 0725   ALT 43 (H) 04/19/2023 0725   ALKPHOS 92 04/19/2023 0725   BILITOT 0.8 04/19/2023 0725   GFRNONAA >60 05/16/2022 1018   GFRAA >60 04/30/2020 2046   Lab Results  Component Value Date   CHOL 137 04/19/2023   HDL 58.20 04/19/2023   LDLCALC 54 04/19/2023   TRIG 123.0 04/19/2023   CHOLHDL 2 04/19/2023   Lab Results  Component Value Date   HGBA1C 5.6 11/21/2023   Lab Results  Component Value Date   VITAMINB12 590 01/10/2023   Lab Results  Component Value Date   TSH 1.28 01/10/2023    PHYSICAL EXAM:  Today's Vitals   02/05/24 0831  BP: 108/74  Pulse: 90  Weight: 174 lb (78.9 kg)  Height: 5' (1.524 m)   Body mass index is 33.98 kg/m.   Wt Readings from Last 3 Encounters:  02/05/24 174 lb (78.9 kg)  11/21/23 169 lb 12.8 oz (77 kg)  07/23/23 175 lb 4.8 oz (79.5 kg)     Ht Readings from Last 3 Encounters:  02/05/24 5' (1.524 m)  02/06/23 5\' 3"  (1.6 m)  01/10/23 5\' 1"  (1.549 m)      General: The patient is awake, alert and appears not in acute distress.  She is pleasant and calm, cooperative. The patient is well groomed. Head: Normocephalic, atraumatic. Neck is supple. Mallampati 2,  neck circumference:13.5 inches .  Nasal airflow is patent.   Dental status: biological  Cardiovascular:  Regular rate and  cardiac rhythm by pulse,  without distended neck veins. Respiratory: Lungs are clear to auscultation.  Skin:  Without evidence of ankle edema, or rash. Trunk: The patient's posture is erect.   NEUROLOGIC EXAM: The patient is awake and alert, oriented to place and time.   Memory subjective described as intact.  Attention span & concentration ability appears normal.  Speech is fluent,  without  dysarthria, dysphonia or aphasia.  Mood and affect are appropriate.   Cranial nerves: no loss of smell or taste reported  Pupils are equal and briskly reactive to light. Funduscopic exam deferred..  Extraocular movements in vertical and horizontal planes were intact and without nystagmus. No Diplopia. Visual fields by finger perimetry are intact. Hearing was intact to soft voice .   Facial sensation intact to fine touch. Facial motor strength is symmetric and tongue and uvula move midline.  Neck ROM : rotation, tilt and flexion extension were normal for age and shoulder shrug was symmetrical.     Motor exam:  Symmetric bulk, tone and ROM.   Normal tone without cog wheeling, symmetric grip strength .   Sensory:  Fine touch, pinprick and vibration were tested  and  normal.  Proprioception tested in the upper extremities was normal.   Coordination: Rapid alternating movements in the fingers/hands were of normal speed.  The Finger-to-nose maneuver was intact without evidence of ataxia, dysmetria or tremor.   Gait and station: Patient could rise unassisted from a seated position, walked without assistive device.  Stance is of normal width/ base and the patient turned with 3 steps.  Toe and heel walk were deferred.  Deep tendon reflexes: in the  upper and lower extremities are symmetric and intact.  Babinski response was deferred. l     ASSESSMENT AND PLAN:  43 y.o. year old female  here with:  History of analgesic overuse and migraine , see above named 5 types of her headaches:     1)  several types of headaches - some daily , same monthly, all of them affecting her more often in spring and summer ( seasonal). Tension headaches may respond to a muscle relaxer, she is on tizanidine  with little  success. I exchanged for Flexaril,  5 mg at night.   2) Migraine and cluster type - Imitrex  causes her neck and jaw to tighten,an unpleasant sensation. Maxalt failed.  Cambia failed.  Amitryptiline failed.   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   I would not use Topiramate for that reason( word finding difficulties )  and the possible dyseasthesias, but like to add propanolol ER at night po. Aaron Aas    3) morning headaches , could be related to sleep apnea or hypoxia. She had a futile sleep study due to Insomnia in the past.  I will order a HST as a screening test.  4)  history of pseudotumor cerebri-  her main risk factor was BMI, she lost 60 pounds on Mounjaro now and this may have already reduced her pressure.  She had several LP before she lost much weight ( about 8 years ago) .No new MRI since ?  I will order a brain MRI.    5) reduce the OTC daily use for prevention of rebound headaches- I added a patient education to this consultation note.    I requested a start dose of Emgality or ajovy to be given here in the office today.   I plan to follow up either personally or through our NP within 4-5 months.   Plan B ) Triggerpoint injection. Possible LP- if MRI supports ongoing pseudotumour.    I would like to thank Zilphia Hilt, Charyl Coppersmith, MD and Zilphia Hilt, Charyl Coppersmith, Md [address] [place],  [place] [address] for allowing me to meet with and to take care of this pleasant patient.   CC: I will share my notes with PCP .  After spending a total time of  45  minutes face to face and additional time for physical and neurologic examination, review of laboratory  studies,  personal review of imaging studies, reports and results of other testing and review of referral information / records as far as provided in visit,   Electronically signed by: Neomia Banner, MD 02/05/2024 8:45 AM  [facility] Board certified by The [employer] of Sleep Medicine and Diplomate of the [employer] of Sleep Medicine. Board certified In Neurology through the ABPN, Fellow of the [employer] of Neurology.

## 2024-02-05 NOTE — Telephone Encounter (Signed)
 Pt received a Ajovy sample today in the office visit. Pt educated on how to administer the medication

## 2024-02-05 NOTE — Addendum Note (Signed)
 Addended by: Elton Ham on: 02/05/2024 09:55 AM   Modules accepted: Orders

## 2024-02-08 ENCOUNTER — Ambulatory Visit (INDEPENDENT_AMBULATORY_CARE_PROVIDER_SITE_OTHER): Admitting: Neurology

## 2024-02-08 DIAGNOSIS — G932 Benign intracranial hypertension: Secondary | ICD-10-CM

## 2024-02-08 DIAGNOSIS — G4733 Obstructive sleep apnea (adult) (pediatric): Secondary | ICD-10-CM

## 2024-02-08 DIAGNOSIS — G4440 Drug-induced headache, not elsewhere classified, not intractable: Secondary | ICD-10-CM

## 2024-02-08 DIAGNOSIS — G43701 Chronic migraine without aura, not intractable, with status migrainosus: Secondary | ICD-10-CM

## 2024-02-08 DIAGNOSIS — G44019 Episodic cluster headache, not intractable: Secondary | ICD-10-CM

## 2024-02-08 DIAGNOSIS — E66811 Obesity, class 1: Secondary | ICD-10-CM

## 2024-02-08 DIAGNOSIS — G44219 Episodic tension-type headache, not intractable: Secondary | ICD-10-CM

## 2024-02-11 ENCOUNTER — Encounter (HOSPITAL_COMMUNITY): Payer: Self-pay | Admitting: Licensed Clinical Social Worker

## 2024-02-11 NOTE — Progress Notes (Signed)
   THERAPIST PROGRESS NOTE  Session Time: 1:30pm-2:25pm  Participation Level: Active  Behavioral Response: Well GroomedAlertEuthymic  Type of Therapy: Individual Therapy  Treatment Goals addressed:  TG: Reduce frequency, intensity, and duration of depression symptoms so that daily functioning is improved (OP Depression)  Disciplines:  Interdisciplinary, PROVIDER    Expected end:  03/17/24          LTG: Increase coping skills to manage depression and improve ability to perform daily activities (OP Depression)  Disciplines:  Interdisciplinary, PROVIDER    Expected end:  03/17/24         ProgressTowards Goals: Progressing  Interventions: CBT  Summary: Tracey Beard is a 43 y.o. female who presents with MDD, recurrent, mild.   Suicidal/Homicidal: Nowithout intent/plan  Therapist Response: [name] engaged well in individual in person session with clinician. Clinician utilized CBT to process thoughts, feelings, and interactions. Clinician processed updates in interactions and feelings about husband. Clinician identified some improvement overall with how she copes with him and his choices. Clinician reflected the improvement of confidence [name] displays in her posture, boundaries, and expectations of others. Clinician also discussed coping skills with frustration, anger, or sadness becomes present. Clinician noted increased excitement about life.   Plan: Return again in 2-3 weeks.  Diagnosis: MDD (major depressive disorder), recurrent episode, mild (HCC)  Collaboration of Care: Psychiatrist AEB updated Dr. Levie Ream  Patient/Guardian was advised Release of Information must be obtained prior to any record release in order to collaborate their care with an outside provider. Patient/Guardian was advised if they have not already done so to contact the registration department to sign all necessary forms in order for us  to release information regarding their care.   Consent:  Patient/Guardian gives verbal consent for treatment and assignment of benefits for services provided during this visit. Patient/Guardian expressed understanding and agreed to proceed.   [name], LCSW 02/11/2024

## 2024-02-14 ENCOUNTER — Encounter: Payer: Self-pay | Admitting: Internal Medicine

## 2024-02-14 ENCOUNTER — Ambulatory Visit (INDEPENDENT_AMBULATORY_CARE_PROVIDER_SITE_OTHER): Admitting: Internal Medicine

## 2024-02-14 VITALS — BP 110/80 | HR 90 | Temp 98.8°F | Ht 62.0 in | Wt 174.4 lb

## 2024-02-14 DIAGNOSIS — Z1159 Encounter for screening for other viral diseases: Secondary | ICD-10-CM

## 2024-02-14 DIAGNOSIS — R5383 Other fatigue: Secondary | ICD-10-CM

## 2024-02-14 DIAGNOSIS — R7401 Elevation of levels of liver transaminase levels: Secondary | ICD-10-CM

## 2024-02-14 DIAGNOSIS — E785 Hyperlipidemia, unspecified: Secondary | ICD-10-CM | POA: Diagnosis not present

## 2024-02-14 DIAGNOSIS — I10 Essential (primary) hypertension: Secondary | ICD-10-CM

## 2024-02-14 DIAGNOSIS — Z0000 Encounter for general adult medical examination without abnormal findings: Secondary | ICD-10-CM

## 2024-02-14 DIAGNOSIS — E875 Hyperkalemia: Secondary | ICD-10-CM

## 2024-02-14 DIAGNOSIS — E559 Vitamin D deficiency, unspecified: Secondary | ICD-10-CM

## 2024-02-14 DIAGNOSIS — Z1231 Encounter for screening mammogram for malignant neoplasm of breast: Secondary | ICD-10-CM

## 2024-02-14 DIAGNOSIS — E1169 Type 2 diabetes mellitus with other specified complication: Secondary | ICD-10-CM | POA: Diagnosis not present

## 2024-02-14 LAB — COMPREHENSIVE METABOLIC PANEL WITH GFR
ALT: 10 U/L (ref 0–35)
AST: 13 U/L (ref 0–37)
Albumin: 4.3 g/dL (ref 3.5–5.2)
Alkaline Phosphatase: 73 U/L (ref 39–117)
BUN: 13 mg/dL (ref 6–23)
CO2: 30 meq/L (ref 19–32)
Calcium: 9.6 mg/dL (ref 8.4–10.5)
Chloride: 101 meq/L (ref 96–112)
Creatinine, Ser: 0.86 mg/dL (ref 0.40–1.20)
GFR: 84.23 mL/min (ref >60.00)
Glucose, Bld: 88 mg/dL (ref 70–99)
Potassium: 3.5 meq/L (ref 3.5–5.1)
Sodium: 139 meq/L (ref 135–145)
Total Bilirubin: 0.3 mg/dL (ref 0.2–1.2)
Total Protein: 7.5 g/dL (ref 6.0–8.3)

## 2024-02-14 LAB — CBC WITH DIFFERENTIAL/PLATELET
Basophils Absolute: 0 10*3/uL (ref 0.0–0.1)
Basophils Relative: 0.5 % (ref 0.0–3.0)
Eosinophils Absolute: 0.1 10*3/uL (ref 0.0–0.7)
Eosinophils Relative: 0.9 % (ref 0.0–5.0)
HCT: 37.4 % (ref 36.0–46.0)
Hemoglobin: 12.6 g/dL (ref 12.0–15.0)
Lymphocytes Relative: 45.8 % (ref 12.0–46.0)
Lymphs Abs: 3.7 10*3/uL (ref 0.7–4.0)
MCHC: 33.7 g/dL (ref 30.0–36.0)
MCV: 84.8 fl (ref 78.0–100.0)
Monocytes Absolute: 0.5 10*3/uL (ref 0.1–1.0)
Monocytes Relative: 5.6 % (ref 3.0–12.0)
Neutro Abs: 3.8 10*3/uL (ref 1.4–7.7)
Neutrophils Relative %: 47.2 % (ref 43.0–77.0)
Platelets: 398 10*3/uL (ref 150.0–400.0)
RBC: 4.41 Mil/uL (ref 3.87–5.11)
RDW: 13.9 % (ref 11.5–15.5)
WBC: 8.1 10*3/uL (ref 4.0–10.5)

## 2024-02-14 LAB — TSH: TSH: 1.74 u[IU]/mL (ref 0.35–5.50)

## 2024-02-14 LAB — LIPID PANEL
Cholesterol: 185 mg/dL (ref 0–200)
HDL: 52.9 mg/dL (ref >39.00)
LDL Cholesterol: 95 mg/dL (ref 0–99)
NonHDL: 132.26
Total CHOL/HDL Ratio: 4
Triglycerides: 188 mg/dL — ABNORMAL HIGH (ref 0.0–149.0)
VLDL: 37.6 mg/dL (ref 0.0–40.0)

## 2024-02-14 LAB — HEMOGLOBIN A1C: Hgb A1c MFr Bld: 5.5 % (ref 4.6–6.5)

## 2024-02-14 LAB — VITAMIN B12: Vitamin B-12: 624 pg/mL (ref 211–911)

## 2024-02-14 LAB — VITAMIN D 25 HYDROXY (VIT D DEFICIENCY, FRACTURES): VITD: 18.67 ng/mL — ABNORMAL LOW (ref 30.00–100.00)

## 2024-02-14 NOTE — Progress Notes (Signed)
 Established Patient Office Visit     CC/Reason for Visit: Annual preventive exam  HPI: Tracey Beard is a 43 y.o. female who is coming in today for the above mentioned reasons. Past Medical History is significant for: Hypertension, hyperlipidemia, type 2 diabetes, obesity, anxiety, insomnia, migraine headaches.  She follows routinely with psychiatry and neurology.  Has routine eye and dental care.  Is overdue for mammogram but other cancer screening is up-to-date.   Past Medical/Surgical History: Past Medical History:  Diagnosis Date   Allergy    Anxiety    Arthritis    Chronic back pain    Depression    GERD (gastroesophageal reflux disease)    Gestational diabetes    glyburide    Headache    Heart murmur    Hypertension    Idiopathic intracranial hypertension    Leukocytosis 02/25/2015   Obesity    Pregnancy induced hypertension    labetalol     Past Surgical History:  Procedure Laterality Date   CESAREAN SECTION N/A 01/29/2019   Procedure: CESAREAN SECTION;  Surgeon: Dyanna Glasgow, DO;  Location: [facility];  Service: Obstetrics;  Laterality: N/A;   CHOLECYSTECTOMY     LUMBAR PUNCTURE  2014   5x    Social History:  reports that she has never smoked. She has never used smokeless tobacco. She reports that she does not currently use alcohol. She reports that she does not use drugs.  Allergies: Allergies  Allergen Reactions   Elavil [Amitriptyline Hcl] Shortness Of Breath   Restoril  [Temazepam ] Shortness Of Breath   Trazodone And Nefazodone Anaphylaxis   Diamox [Acetazolamide] Other (See Comments)    Cold chills/loss of taste    Tomato Swelling and Other (See Comments)    Causes lips to swell   Norco [Hydrocodone -Acetaminophen ] Itching   Pollen Extract Other (See Comments)    Unknown reaction    Benzocaine Swelling, Rash and Other (See Comments)    Localized swelling   Latex Rash   Novocain [Procaine] Swelling, Rash and Other (See Comments)    Localized  swelling    Family History:  Family History  Problem Relation Age of Onset   Hypertension Mother    Breast cancer Mother    Pancreatic cancer Mother    Diabetes Mother    Cancer Mother    Hypertension Father    Hypertension Maternal Grandmother    Heart disease Maternal Grandmother    Arthritis Maternal Grandmother    Hypertension Maternal Grandfather    Diabetes Maternal Grandfather    Colon cancer Paternal Grandmother    Heart disease Paternal Grandfather    Hypertension Maternal Aunt    Breast cancer Maternal Aunt    Obesity Maternal Aunt    Diabetes Maternal Aunt    Hypertension Maternal Uncle    Liver cancer Maternal Uncle    Alcohol abuse Maternal Uncle    Prostate cancer Maternal Uncle    Miscarriages / Stillbirths Daughter    Asthma Brother    Cancer Maternal Aunt      Current Outpatient Medications:    buPROPion  (WELLBUTRIN  XL) 150 MG 24 hr tablet, 1 qam  for 1 week then 2 qam (Patient taking differently: Take 150 mg by mouth daily.), Disp: 180 tablet, Rfl: 0   busPIRone  (BUSPAR ) 5 MG tablet, 1 TID (Patient taking differently: Take 5 mg by mouth daily.), Disp: 270 tablet, Rfl: 1   Continuous Glucose Receiver (FREESTYLE LIBRE 3 READER) DEVI, 1 each by Does not apply route See  admin instructions., Disp: 2 each, Rfl: 3   Continuous Glucose Sensor (FREESTYLE LIBRE 3 SENSOR) MISC, 1 each by Does not apply route See admin instructions. Place 1 sensor on the skin every 14 days. Use to check glucose continuously, Disp: 2 each, Rfl: 3   cyclobenzaprine  (FLEXERIL ) 5 MG tablet, Take 1 tablet (5 mg total) by mouth 3 (three) times daily as needed for muscle spasms., Disp: 60 tablet, Rfl: 1   esomeprazole  (NEXIUM ) 40 MG capsule, Take 1 capsule (40 mg total) by mouth 2 (two) times daily before a meal., Disp: 60 capsule, Rfl: 5   Fremanezumab-vfrm (AJOVY) 225 MG/1.5ML SOAJ, Inject 225 mg into the skin every 30 (thirty) days., Disp: 1.5 mL, Rfl: 5   ondansetron  (ZOFRAN ) 4 MG tablet,  Take 1 tablet (4 mg total) by mouth every 6 (six) hours., Disp: 12 tablet, Rfl: 0   ondansetron  (ZOFRAN -ODT) 4 MG disintegrating tablet, Take 1 tablet (4 mg total) by mouth every 8 (eight) hours as needed for nausea or vomiting., Disp: 20 tablet, Rfl: 0   OZEMPIC , 1 MG/DOSE, 4 MG/3ML SOPN, INJECT 1mg  SUBCUTANEOUSLY Once weekly, Disp: 3 mL, Rfl: 2   propranolol ER (INDERAL LA) 80 MG 24 hr capsule, Take 1 capsule (80 mg total) by mouth at bedtime., Disp: 90 capsule, Rfl: 1   temazepam  (RESTORIL ) 15 MG capsule, 2 qhs, Disp: 60 capsule, Rfl: 4   triamterene -hydrochlorothiazide  (MAXZIDE ) 75-50 MG tablet, Take 1 tablet by mouth daily., Disp: 90 tablet, Rfl: 1  Review of Systems:  Negative unless indicated in HPI.   Physical Exam: Vitals:   02/14/24 0811  BP: 110/80  Pulse: 90  Temp: 98.8 F (37.1 C)  TempSrc: Oral  SpO2: 100%  Weight: 174 lb 6.4 oz (79.1 kg)  Height: 5\' 2"  (1.575 m)    Body mass index is 31.9 kg/m.   Physical Exam Vitals reviewed.  Constitutional:      General: She is not in acute distress.    Appearance: Normal appearance. She is not ill-appearing, toxic-appearing or diaphoretic.  HENT:     Head: Normocephalic.     Right Ear: Tympanic membrane, ear canal and external ear normal. There is no impacted cerumen.     Left Ear: Tympanic membrane, ear canal and external ear normal. There is no impacted cerumen.     Nose: Nose normal.     Mouth/Throat:     Mouth: Mucous membranes are moist.     Pharynx: Oropharynx is clear. No oropharyngeal exudate or posterior oropharyngeal erythema.  Eyes:     General: No scleral icterus.       Right eye: No discharge.        Left eye: No discharge.     Conjunctiva/sclera: Conjunctivae normal.     Pupils: Pupils are equal, round, and reactive to light.  Neck:     Vascular: No carotid bruit.  Cardiovascular:     Rate and Rhythm: Normal rate and regular rhythm.     Pulses: Normal pulses.     Heart sounds: Normal heart sounds.   Pulmonary:     Effort: Pulmonary effort is normal. No respiratory distress.     Breath sounds: Normal breath sounds.  Abdominal:     General: Abdomen is flat. Bowel sounds are normal.     Palpations: Abdomen is soft.  Musculoskeletal:        General: Normal range of motion.     Cervical back: Normal range of motion.  Skin:    General: Skin is  warm and dry.  Neurological:     General: No focal deficit present.     Mental Status: She is alert and oriented to person, place, and time. Mental status is at baseline.  Psychiatric:        Mood and Affect: Mood normal.        Behavior: Behavior normal.        Thought Content: Thought content normal.        Judgment: Judgment normal.     Flowsheet Row Office Visit from 02/14/2024 in [facility] [place]  PHQ-9 Total Score 1        Impression and Plan:  Encounter for preventive health examination  Type 2 diabetes mellitus with other specified complication, without long-term current use of insulin  (HCC) -     Hemoglobin A1c; Future -     CBC with Differential/Platelet; Future -     Comprehensive metabolic panel with GFR; Future  Hyperlipidemia associated with type 2 diabetes mellitus (HCC) -     Lipid panel; Future  Primary hypertension  Fatigue, unspecified type -     TSH; Future -     Vitamin B12; Future  Vitamin D  deficiency -     VITAMIN D  25 Hydroxy (Vit-D Deficiency, Fractures); Future  Hyperkalemia  Transaminitis  Encounter for hepatitis C screening test for low risk patient -     Hepatitis C antibody; Future   -Recommend routine eye and dental care. -Healthy lifestyle discussed in detail. -Labs to be updated today. -Prostate cancer screening: Not applicable Health Maintenance  Topic Date Due   COVID-19 Vaccine (1) Never done   Hepatitis C Screening  Never done   Pneumococcal Vaccination (1 of 2 - PCV) Never done   Eye exam for diabetics  01/24/2024   Complete foot exam    02/21/2024   Yearly kidney health urinalysis for diabetes  04/18/2024   Flu Shot  04/18/2024   Yearly kidney function blood test for diabetes  04/25/2024   Hemoglobin A1C  05/23/2024   Pap with HPV screening  01/20/2025   DTaP/Tdap/Td vaccine (9 - Td or Tdap) 11/15/2028   HIV Screening  Completed   HPV Vaccine  Aged Out   Meningitis B Vaccine  Aged Out     -Tdap up-to-date. - Appropriate referrals for health maintenance placed.    Marguerita Shih, MD [facility]

## 2024-02-15 LAB — HEPATITIS C ANTIBODY: Hepatitis C Ab: NONREACTIVE

## 2024-02-19 ENCOUNTER — Encounter: Payer: Self-pay | Admitting: Internal Medicine

## 2024-02-19 ENCOUNTER — Ambulatory Visit: Payer: Self-pay | Admitting: Internal Medicine

## 2024-02-19 DIAGNOSIS — E559 Vitamin D deficiency, unspecified: Secondary | ICD-10-CM

## 2024-02-19 DIAGNOSIS — E1169 Type 2 diabetes mellitus with other specified complication: Secondary | ICD-10-CM

## 2024-02-19 MED ORDER — ROSUVASTATIN CALCIUM 5 MG PO TABS: 5.0000 mg | ORAL_TABLET | Freq: Every day | ORAL | 1 refills | Status: DC

## 2024-02-19 MED ORDER — VITAMIN D (ERGOCALCIFEROL) 1.25 MG (50000 UNIT) PO CAPS: 50000.0000 [IU] | ORAL_CAPSULE | ORAL | 0 refills | Status: AC

## 2024-02-25 ENCOUNTER — Other Ambulatory Visit: Payer: Self-pay | Admitting: Internal Medicine

## 2024-02-27 ENCOUNTER — Encounter: Payer: Self-pay | Admitting: Internal Medicine

## 2024-02-27 ENCOUNTER — Other Ambulatory Visit: Payer: Self-pay | Admitting: Internal Medicine

## 2024-02-27 NOTE — Telephone Encounter (Signed)
 Copied from CRM [id number]. Topic: Clinical - Medication Refill >> Feb 27, 2024 11:35 AM Martin[place] E wrote: Medication: ondansetron  (ZOFRAN -ODT) 4 MG disintegrating tablet  Has the patient contacted their pharmacy? Yes (Agent: If no, request that the patient contact the pharmacy for the refill. If patient does not wish to contact the pharmacy document the reason why and proceed with request.) (Agent: If yes, when and what did the pharmacy advise?)  This is the patient's preferred pharmacy:  [facility] - [place], [place] - [address] [place] [address] Phone: [phone] Fax: [phone]  Is this the correct pharmacy for this prescription? Yes If no, delete pharmacy and type the correct one.   Has the prescription been filled recently? No  Is the patient out of the medication? Yes  Has the patient been seen for an appointment in the last year OR does the patient have an upcoming appointment? Yes  Can we respond through MyChart? Yes  Agent: Please be advised that Rx refills may take up to 3 business days. We ask that you follow-up with your pharmacy.

## 2024-02-28 MED ORDER — ONDANSETRON 4 MG PO TBDP: 4.0000 mg | ORAL_TABLET | Freq: Three times a day (TID) | ORAL | 0 refills | Status: DC | PRN

## 2024-03-04 ENCOUNTER — Encounter (HOSPITAL_COMMUNITY): Payer: Self-pay

## 2024-03-04 ENCOUNTER — Other Ambulatory Visit: Payer: Self-pay

## 2024-03-04 ENCOUNTER — Emergency Department (HOSPITAL_BASED_OUTPATIENT_CLINIC_OR_DEPARTMENT_OTHER)
Admission: EM | Admit: 2024-03-04 | Discharge: 2024-03-04 | Disposition: A | Discharge status: Home / Self Care | Attending: Emergency Medicine | Admitting: Emergency Medicine

## 2024-03-04 ENCOUNTER — Ambulatory Visit (HOSPITAL_COMMUNITY): Admitting: Licensed Clinical Social Worker

## 2024-03-04 ENCOUNTER — Encounter (HOSPITAL_BASED_OUTPATIENT_CLINIC_OR_DEPARTMENT_OTHER): Payer: Self-pay

## 2024-03-04 ENCOUNTER — Emergency Department (HOSPITAL_BASED_OUTPATIENT_CLINIC_OR_DEPARTMENT_OTHER)

## 2024-03-04 DIAGNOSIS — I10 Essential (primary) hypertension: Secondary | ICD-10-CM | POA: Diagnosis not present

## 2024-03-04 DIAGNOSIS — R079 Chest pain, unspecified: Secondary | ICD-10-CM | POA: Diagnosis present

## 2024-03-04 DIAGNOSIS — R110 Nausea: Secondary | ICD-10-CM | POA: Insufficient documentation

## 2024-03-04 DIAGNOSIS — Z79899 Other long term (current) drug therapy: Secondary | ICD-10-CM | POA: Diagnosis not present

## 2024-03-04 LAB — BASIC METABOLIC PANEL WITH GFR
Anion gap: 12 (ref 5–15)
BUN: 13 mg/dL (ref 6–20)
CO2: 23 mmol/L (ref 22–32)
Calcium: 9.5 mg/dL (ref 8.9–10.3)
Chloride: 103 mmol/L (ref 98–111)
Creatinine, Ser: 0.88 mg/dL (ref 0.44–1.00)
GFR, Estimated: >60 mL/min (ref >60)
Glucose, Bld: 96 mg/dL (ref 70–99)
Potassium: 3.3 mmol/L — ABNORMAL LOW (ref 3.5–5.1)
Sodium: 138 mmol/L (ref 135–145)

## 2024-03-04 LAB — CBC
HCT: 36.1 % (ref 36.0–46.0)
Hemoglobin: 12.6 g/dL (ref 12.0–15.0)
MCH: 28.7 pg (ref 26.0–34.0)
MCHC: 34.9 g/dL (ref 30.0–36.0)
MCV: 82.2 fL (ref 80.0–100.0)
Platelets: 364 10*3/uL (ref 150–400)
RBC: 4.39 MIL/uL (ref 3.87–5.11)
RDW: 13.6 % (ref 11.5–15.5)
WBC: 11.8 10*3/uL — ABNORMAL HIGH (ref 4.0–10.5)
nRBC: 0 % (ref 0.0–0.2)

## 2024-03-04 LAB — D-DIMER, QUANTITATIVE: D-Dimer, Quant: 0.29 ug{FEU}/mL (ref 0.00–0.50)

## 2024-03-04 LAB — TROPONIN T, HIGH SENSITIVITY: Troponin T High Sensitivity: <15 ng/L (ref <19)

## 2024-03-04 MED ORDER — SUCRALFATE 1 G PO TABS: 1.0000 g | ORAL_TABLET | Freq: Four times a day (QID) | ORAL | 0 refills | Status: DC | PRN

## 2024-03-04 MED ORDER — METHOCARBAMOL 500 MG PO TABS: 500.0000 mg | ORAL_TABLET | Freq: Three times a day (TID) | ORAL | 0 refills | Status: DC | PRN

## 2024-03-04 MED ORDER — ONDANSETRON 4 MG PO TBDP
4.0000 mg | ORAL_TABLET | Freq: Once | ORAL | Status: AC
  Administered 2024-03-04: 4 mg via ORAL
  Filled 2024-03-04: qty 1

## 2024-03-04 MED ORDER — KETOROLAC TROMETHAMINE 15 MG/ML IJ SOLN
15.0000 mg | Freq: Once | INTRAMUSCULAR | Status: AC
  Administered 2024-03-04: 15 mg via INTRAVENOUS
  Filled 2024-03-04: qty 1

## 2024-03-04 MED ORDER — ONDANSETRON 4 MG PO TBDP: 4.0000 mg | ORAL_TABLET | Freq: Three times a day (TID) | ORAL | 0 refills | Status: DC | PRN

## 2024-03-04 MED ORDER — ALUM & MAG HYDROXIDE-SIMETH 200-200-20 MG/5ML PO SUSP
30.0000 mL | Freq: Once | ORAL | Status: AC
  Administered 2024-03-04: 30 mL via ORAL
  Filled 2024-03-04: qty 30

## 2024-03-04 NOTE — Discharge Instructions (Addendum)
 You were evaluated in the Emergency Department and after careful evaluation, we did not find any emergent condition requiring admission or further testing in the hospital.  Your exam/testing today is overall reassuring.  Symptoms may be due to acid reflux or muscle strain or spasm.  Recommend Tylenol  1000 mg every 4-6 hours for pain.  Continue your Nexium .  Can use Carafate  as needed for discomfort, this will coat and soothe the stomach.  Can also try the Robaxin  muscle relaxer to see if this helps but use caution because this medication can cause drowsiness.  Please return to the Emergency Department if you experience any worsening of your condition.   Thank you for allowing us  to be a part of your care.

## 2024-03-04 NOTE — ED Provider Notes (Signed)
 [facility] Emergency Department Provider Note MRN:  [id number]  Arrival date & time: 03/04/24     Chief Complaint   Chest Pain   History of Present Illness   Tracey Beard is a 43 y.o. year-old female with a history of hypertension presenting to the ED with chief complaint of chest pain.  Pain to the chest worse with deep breaths for the past 2 or 3 days.  Associated with nausea as well.  Review of Systems  A thorough review of systems was obtained and all systems are negative except as noted in the HPI and PMH.   Patient's Health History    Past Medical History:  Diagnosis Date   Allergy    Anxiety    Arthritis    Chronic back pain    Depression    GERD (gastroesophageal reflux disease)    Gestational diabetes    glyburide    Headache    Heart murmur    Hypertension    Idiopathic intracranial hypertension    Leukocytosis 02/25/2015   Obesity    Pregnancy induced hypertension    labetalol     Past Surgical History:  Procedure Laterality Date   CESAREAN SECTION N/A 01/29/2019   Procedure: CESAREAN SECTION;  Surgeon: Dyanna Glasgow, DO;  Location: [facility]S;  Service: Obstetrics;  Laterality: N/A;   CHOLECYSTECTOMY     LUMBAR PUNCTURE  2014   5x    Family History  Problem Relation Age of Onset   Hypertension Mother    Breast cancer Mother    Pancreatic cancer Mother    Diabetes Mother    Cancer Mother    Hypertension Father    Hypertension Maternal Grandmother    Heart disease Maternal Grandmother    Arthritis Maternal Grandmother    Hypertension Maternal Grandfather    Diabetes Maternal Grandfather    Colon cancer Paternal Grandmother    Heart disease Paternal Grandfather    Hypertension Maternal Aunt    Breast cancer Maternal Aunt    Obesity Maternal Aunt    Diabetes Maternal Aunt    Hypertension Maternal Uncle    Liver cancer Maternal Uncle    Alcohol abuse Maternal Uncle    Prostate cancer Maternal Uncle    Miscarriages /  Stillbirths Daughter    Asthma Brother    Cancer Maternal Aunt     Social History   Socioeconomic History   Marital status: Married    Spouse name: Not on file   Number of children: 2   Years of education: Not on file   Highest education level: Associate degree: occupational, Scientist, product/process development, or vocational program  Occupational History   Occupation: Nurse  Tobacco Use   Smoking status: Never   Smokeless tobacco: Never  Vaping Use   Vaping status: Never Used  Substance and Sexual Activity   Alcohol use: Not Currently    Comment: socially   Drug use: No   Sexual activity: Not Currently    Birth control/protection: None  Other Topics Concern   Not on file  Social History Narrative   Not on file   Social Drivers of Health   Financial Resource Strain: Medium Risk (11/20/2023)   Overall Financial Resource Strain (CARDIA)    Difficulty of Paying Living Expenses: Somewhat hard  Food Insecurity: No Food Insecurity (11/20/2023)   Hunger Vital Sign    Worried About Running Out of Food in the Last Year: Never true    Ran Out of Food in the Last Year: Never  true  Transportation Needs: No Transportation Needs (11/20/2023)   PRAPARE - Administrator, Civil Service (Medical): No    Lack of Transportation (Non-Medical): No  Physical Activity: Insufficiently Active (11/20/2023)   Exercise Vital Sign    Days of Exercise per Week: 3 days    Minutes of Exercise per Session: 30 min  Stress: No Stress Concern Present (11/20/2023)   [employer] of Occupational Health - Occupational Stress Questionnaire    Feeling of Stress : Not at all  Social Connections: Socially Isolated (11/20/2023)   Social Connection and Isolation Panel    Frequency of Communication with Friends and Family: Once a week    Frequency of Social Gatherings with Friends and Family: Once a week    Attends Religious Services: Never    Database administrator or Organizations: No    Attends Engineer, structural:  Not on file    Marital Status: Married  Catering manager Violence: Not At Risk (01/21/2019)   Humiliation, Afraid, Rape, and Kick questionnaire    Fear of Current or Ex-Partner: No    Emotionally Abused: No    Physically Abused: No    Sexually Abused: No     Physical Exam   Vitals:   03/04/24 0500 03/04/24 0530  BP: 112/86 100/75  Pulse: 89 87  Resp: 15   Temp:    SpO2: 100% 99%    CONSTITUTIONAL: Well-appearing, NAD NEURO/PSYCH:  Alert and oriented x 3, no focal deficits EYES:  eyes equal and reactive ENT/NECK:  no LAD, no JVD CARDIO: Regular rate, well-perfused, normal S1 and S2 PULM:  CTAB no wheezing or rhonchi GI/GU:  non-distended, non-tender MSK/SPINE:  No gross deformities, no edema SKIN:  no rash, atraumatic   *Additional and/or pertinent findings included in MDM below  Diagnostic and Interventional Summary    EKG Interpretation Date/Time:  Tuesday March 04 2024 04:55:06 EDT Ventricular Rate:  90 PR Interval:  135 QRS Duration:  96 QT Interval:  365 QTC Calculation: 447 R Axis:   61  Text Interpretation: Sinus rhythm Baseline wander in lead(s) II III aVR aVL aVF Confirmed by Gwenetta Lennert [phone]) on 03/04/2024 6:09:41 AM       Labs Reviewed  CBC - Abnormal; Notable for the following components:      Result Value   WBC 11.8 (*)    All other components within normal limits  BASIC METABOLIC PANEL WITH GFR - Abnormal; Notable for the following components:   Potassium 3.3 (*)    All other components within normal limits  D-DIMER, QUANTITATIVE  TROPONIN T, HIGH SENSITIVITY    DG Chest Port 1 View  Final Result      Medications  alum & mag hydroxide-simeth (MAALOX/MYLANTA) 200-200-20 MG/5ML suspension 30 mL (has no administration in time range)  ondansetron  (ZOFRAN -ODT) disintegrating tablet 4 mg (has no administration in time range)  ketorolac  (TORADOL ) 15 MG/ML injection 15 mg (15 mg Intravenous Given 03/04/24 0519)     Procedures  /  Critical  Care Procedures  ED Course and Medical Decision Making  Initial Impression and Ddx Differential diagnosis includes MSK, GERD, considered low risk for PE as well as ACS.  Past medical/surgical history that increases complexity of ED encounter: None  Interpretation of Diagnostics I personally reviewed the EKG and my interpretation is as follows: Sinus rhythm without concerning ischemic findings  No significant blood count or electrolyte disturbance.  D-dimer negative, troponin negative  Patient Reassessment and Ultimate Disposition/Management  On reassessment patient continues to look comfortable with no acute distress, normal vitals.  Doubt emergent process.  Favoring MSK versus GERD.  Appropriate for discharge.  Patient management required discussion with the following services or consulting groups:  None  Complexity of Problems Addressed Acute illness or injury that poses threat of life of bodily function  Additional Data Reviewed and Analyzed Further history obtained from: Care Everywhere and Prior labs/imaging results  Additional Factors Impacting ED Encounter Risk Prescriptions  Merrick Abe. Harless Lien, MD [facility] Emergency Medicine [facility] [email or web address]  Final Clinical Impressions(s) / ED Diagnoses     ICD-10-CM   1. Chest pain, unspecified type  R07.9       ED Discharge Orders          Ordered    sucralfate  (CARAFATE ) 1 g tablet  4 times daily PRN        03/04/24 0613    methocarbamol  (ROBAXIN ) 500 MG tablet  Every 8 hours PRN        03/04/24 0613    ondansetron  (ZOFRAN -ODT) 4 MG disintegrating tablet  Every 8 hours PRN        03/04/24 1610             Discharge Instructions Discussed with and Provided to Patient:     Discharge Instructions      You were evaluated in the Emergency Department and after careful evaluation, we did not find any emergent condition requiring admission or further testing in the hospital.  Your  exam/testing today is overall reassuring.  Symptoms may be due to acid reflux or muscle strain or spasm.  Recommend Tylenol  1000 mg every 4-6 hours for pain.  Continue your Nexium .  Can use Carafate  as needed for discomfort, this will coat and soothe the stomach.  Can also try the Robaxin  muscle relaxer to see if this helps but use caution because this medication can cause drowsiness.  Please return to the Emergency Department if you experience any worsening of your condition.   Thank you for allowing us  to be a part of your care.       Edson Graces, MD 03/04/24 [phone]

## 2024-03-04 NOTE — ED Triage Notes (Signed)
 Pt reports pain is reproducible to touch on left side of chest. Denies any alleviating factors.

## 2024-03-04 NOTE — ED Triage Notes (Signed)
 Pt reports Chest pain starting 3 days ago starting suddenly, has been intermittent. Pt reports started on right side, has moved to left side. Pt states pain is sharp and stabbing. Associated dizziness and nausea. Hx of HTN. NAD noted in triage.

## 2024-03-18 ENCOUNTER — Ambulatory Visit (INDEPENDENT_AMBULATORY_CARE_PROVIDER_SITE_OTHER): Admitting: Licensed Clinical Social Worker

## 2024-03-18 DIAGNOSIS — F330 Major depressive disorder, recurrent, mild: Secondary | ICD-10-CM

## 2024-03-19 ENCOUNTER — Encounter (HOSPITAL_COMMUNITY): Payer: Self-pay | Admitting: Psychiatry

## 2024-03-19 ENCOUNTER — Other Ambulatory Visit: Payer: Self-pay

## 2024-03-19 ENCOUNTER — Encounter (HOSPITAL_COMMUNITY): Payer: Self-pay | Admitting: Licensed Clinical Social Worker

## 2024-03-19 ENCOUNTER — Ambulatory Visit (HOSPITAL_BASED_OUTPATIENT_CLINIC_OR_DEPARTMENT_OTHER): Admitting: Psychiatry

## 2024-03-19 VITALS — BP 110/81 | HR 78 | Ht 61.0 in | Wt 170.0 lb

## 2024-03-19 DIAGNOSIS — F325 Major depressive disorder, single episode, in full remission: Secondary | ICD-10-CM | POA: Diagnosis not present

## 2024-03-19 MED ORDER — TEMAZEPAM 15 MG PO CAPS: ORAL_CAPSULE | ORAL | 4 refills | Status: DC

## 2024-03-19 MED ORDER — BUPROPION HCL ER (XL) 150 MG PO TB24: ORAL_TABLET | ORAL | 2 refills | Status: DC

## 2024-03-19 NOTE — Progress Notes (Signed)
   THERAPIST PROGRESS NOTE  Session Time: 10:00am-10:55am  Participation Level: Active  Behavioral Response: Well GroomedAlertAnxious and Euthymic  Type of Therapy: Individual Therapy  Treatment Goals addressed:  TG: Reduce frequency, intensity, and duration of depression symptoms so that daily functioning is improved (OP Depression)  Disciplines:  Interdisciplinary, PROVIDER    Expected end:  03/17/24          LTG: Increase coping skills to manage depression and improve ability to perform daily activities (OP Depression)  Disciplines:  Interdisciplinary, PROVIDER    Expected end:  03/17/24       ProgressTowards Goals: Progressing  Interventions: CBT  Summary: Jakyiah Briones is a 43 y.o. female who presents with MDD, recurrent, mild.   Suicidal/Homicidal: Nowithout intent/plan  Therapist Response: Shulamis engaged well in individual in person session with clinician. Clinician utilized CBT to process thoughts, feelings,and behaviors. Clinician explored updates and noted that even with a month between sessions, Ilissa has been able to maintain overall stability. Clinician discussed continuing triggers by husband's behavior, particularly when his behavior impacts their son. Clinician processed Caleah's anger/anxiety response when son is upset. Clinician reflected that her husband's values are not aligned with Anabella's values, so much so that it causes a psychological reaction. Clinician identified that this reaction can occur while awake or while resting. Mercadez shared she has been waking up with clenched jaws and tight neck.  Clinician reviewed progress made over the past 1 year since starting therapy. Clinician identified the significant changes in physical and emotional health. Clinician also reflected on the improvement in self esteem and self worth felt by Manuelita.   Plan: Return again in 3-4 weeks.  Diagnosis: MDD (major depressive disorder), recurrent episode, mild  (HCC)  Collaboration of Care: Psychiatrist AEB updated Dr. Tasia  Patient/Guardian was advised Release of Information must be obtained prior to any record release in order to collaborate their care with an outside provider. Patient/Guardian was advised if they have not already done so to contact the registration department to sign all necessary forms in order for us  to release information regarding their care.   Consent: Patient/Guardian gives verbal consent for treatment and assignment of benefits for services provided during this visit. Patient/Guardian expressed understanding and agreed to proceed.   [name] Sayre, LCSW 03/19/2024

## 2024-03-19 NOTE — Progress Notes (Signed)
 Psychiatric Initial Adult Assessment   Patient Identification: Tracey Beard MRN:  [id number] Date of Evaluation:  03/19/2024 Referral Source: Dr. Dedra Dohmeier Chief Complaint: Depression Visit Diagnosis:   History of Present Illness:    Today the patient is doing actually very well.  Her stepmother actually paid off her lawyer's fees  By paying off of lawyers she was able to believe that they would go to back for her and finish up her court trial.  It was also called because she had fees to pay.  She feels like a big load is off her back.  She expects that the court trial will be done within the next 1 to 2 months.  Then she can start looking for employment.  Right now she is enjoying her son a great deal.  Her mood is good.  She drinks no alcohol and uses no drugs.  She loves to meditate.  She is sleeping and eating well has good energy and can think and concentrate without a problem.  She has a good sense of worth at this time.  She continues in one-to-one therapy.  At 150 mg of Wellbutrin  and it seems to be beneficial and is no longer causing any tremors.  She takes Restoril  on a as needed basis and only 5 mg of BuSpar .  Physically she is doing well.  She continues in one-to-one therapy. Associated Signs/Symptoms: Depression Symptoms:   (Hypo) Manic Symptoms:   Anxiety Symptoms:   Psychotic Symptoms:   PTSD Symptoms: NA  Past Psychiatric History:   Previous Psychotropic Medications: Yes   Substance Abuse History in the last 12 months:  Yes.    Consequences of Substance Abuse:   Past Medical History:  Past Medical History:  Diagnosis Date   Allergy    Anxiety    Arthritis    Chronic back pain    Depression    GERD (gastroesophageal reflux disease)    Gestational diabetes    glyburide    Headache    Heart murmur    Hypertension    Idiopathic intracranial hypertension    Leukocytosis 02/25/2015   Obesity    Pregnancy induced hypertension    labetalol     Past  Surgical History:  Procedure Laterality Date   CESAREAN SECTION N/A 01/29/2019   Procedure: CESAREAN SECTION;  Surgeon: Dannielle Bouchard, DO;  Location: [facility] LD [facility];  Service: Obstetrics;  Laterality: N/A;   CHOLECYSTECTOMY     LUMBAR PUNCTURE  2014   5x    Family Psychiatric History:   Family History:  Family History  Problem Relation Age of Onset   Hypertension Mother    Breast cancer Mother    Pancreatic cancer Mother    Diabetes Mother    Cancer Mother    Hypertension Father    Hypertension Maternal Grandmother    Heart disease Maternal Grandmother    Arthritis Maternal Grandmother    Hypertension Maternal Grandfather    Diabetes Maternal Grandfather    Colon cancer Paternal Grandmother    Heart disease Paternal Grandfather    Hypertension Maternal Aunt    Breast cancer Maternal Aunt    Obesity Maternal Aunt    Diabetes Maternal Aunt    Hypertension Maternal Uncle    Liver cancer Maternal Uncle    Alcohol abuse Maternal Uncle    Prostate cancer Maternal Uncle    Miscarriages / Stillbirths Daughter    Asthma Brother    Cancer Maternal Aunt     Social History:   Social History  Socioeconomic History   Marital status: Married    Spouse name: Not on file   Number of children: 2   Years of education: Not on file   Highest education level: Associate degree: occupational, Scientist, product/process development, or vocational program  Occupational History   Occupation: Nurse  Tobacco Use   Smoking status: Never   Smokeless tobacco: Never  Vaping Use   Vaping status: Never Used  Substance and Sexual Activity   Alcohol use: Not Currently    Comment: socially   Drug use: No   Sexual activity: Not Currently    Birth control/protection: None  Other Topics Concern   Not on file  Social History Narrative   Not on file   Social Drivers of Health   Financial Resource Strain: Medium Risk (11/20/2023)   Overall Financial Resource Strain (CARDIA)    Difficulty of Paying Living Expenses: Somewhat  hard  Food Insecurity: No Food Insecurity (11/20/2023)   Hunger Vital Sign    Worried About Running Out of Food in the Last Year: Never true    Ran Out of Food in the Last Year: Never true  Transportation Needs: No Transportation Needs (11/20/2023)   PRAPARE - Administrator, Civil Service (Medical): No    Lack of Transportation (Non-Medical): No  Physical Activity: Insufficiently Active (11/20/2023)   Exercise Vital Sign    Days of Exercise per Week: 3 days    Minutes of Exercise per Session: 30 min  Stress: No Stress Concern Present (11/20/2023)   [employer] of Occupational Health - Occupational Stress Questionnaire    Feeling of Stress : Not at all  Social Connections: Socially Isolated (11/20/2023)   Social Connection and Isolation Panel    Frequency of Communication with Friends and Family: Once a week    Frequency of Social Gatherings with Friends and Family: Once a week    Attends Religious Services: Never    Database administrator or Organizations: No    Attends Engineer, structural: Not on file    Marital Status: Married    Additional Social History:   Allergies:   Allergies  Allergen Reactions   Elavil [Amitriptyline Hcl] Shortness Of Breath   Restoril  [Temazepam ] Shortness Of Breath   Trazodone And Nefazodone Anaphylaxis   Diamox [Acetazolamide] Other (See Comments)    Cold chills/loss of taste    Tomato Swelling and Other (See Comments)    Causes lips to swell   Norco [Hydrocodone -Acetaminophen ] Itching   Pollen Extract Other (See Comments)    Unknown reaction    Topamax [Topiramate] Other (See Comments)    Chest tightness   Benzocaine Swelling, Rash and Other (See Comments)    Localized swelling   Latex Rash   Novocain [Procaine] Swelling, Rash and Other (See Comments)    Localized swelling    Metabolic Disorder Labs: Lab Results  Component Value Date   HGBA1C 5.5 02/14/2024   MPG 122.63 05/15/2022   No results found for:  PROLACTIN Lab Results  Component Value Date   CHOL 185 02/14/2024   TRIG 188.0 (H) 02/14/2024   HDL 52.90 02/14/2024   CHOLHDL 4 02/14/2024   VLDL 37.6 02/14/2024   LDLCALC 95 02/14/2024   LDLCALC 54 04/19/2023   Lab Results  Component Value Date   TSH 1.74 02/14/2024    Therapeutic Level Labs: No results found for: LITHIUM No results found for: CBMZ No results found for: VALPROATE  Current Medications: Current Outpatient Medications  Medication Sig Dispense Refill  busPIRone  (BUSPAR ) 5 MG tablet 1 TID 270 tablet 1   Continuous Glucose Receiver (FREESTYLE LIBRE 3 READER) DEVI 1 each by Does not apply route See admin instructions. 2 each 3   Continuous Glucose Sensor (FREESTYLE LIBRE 3 SENSOR) MISC 1 each by Does not apply route See admin instructions. Place 1 sensor on the skin every 14 days. Use to check glucose continuously 2 each 3   esomeprazole  (NEXIUM ) 40 MG capsule Take 1 capsule (40 mg total) by mouth 2 (two) times daily before a meal. 60 capsule 5   Fremanezumab -vfrm (AJOVY ) 225 MG/1.5ML SOAJ Inject 225 mg into the skin every 30 (thirty) days. 1.5 mL 5   ondansetron  (ZOFRAN ) 4 MG tablet Take 1 tablet (4 mg total) by mouth every 6 (six) hours. 12 tablet 0   ondansetron  (ZOFRAN -ODT) 4 MG disintegrating tablet Take 1 tablet (4 mg total) by mouth every 8 (eight) hours as needed for nausea or vomiting. 20 tablet 0   OZEMPIC , 1 MG/DOSE, 4 MG/3ML SOPN INJECT 1mg  SUBCUTANEOUSLY Once weekly 3 mL 2   propranolol  ER (INDERAL  LA) 80 MG 24 hr capsule Take 1 capsule (80 mg total) by mouth at bedtime. 90 capsule 1   rosuvastatin  (CRESTOR ) 5 MG tablet Take 1 tablet (5 mg total) by mouth daily. 90 tablet 1   sucralfate  (CARAFATE ) 1 g tablet Take 1 tablet (1 g total) by mouth 4 (four) times daily as needed. 30 tablet 0   triamterene -hydrochlorothiazide  (MAXZIDE ) 75-50 MG tablet Take 1 tablet by mouth daily. 90 tablet 1   Vitamin D , Ergocalciferol , (DRISDOL ) 1.25 MG (50000 UNIT)  CAPS capsule Take 1 capsule (50,000 Units total) by mouth every 7 (seven) days for 12 doses. 12 capsule 0   buPROPion  (WELLBUTRIN  XL) 150 MG 24 hr tablet 1 qam  for 1 week then 2 qam 180 tablet 2   methocarbamol  (ROBAXIN ) 500 MG tablet Take 1 tablet (500 mg total) by mouth every 8 (eight) hours as needed for muscle spasms. 30 tablet 0   temazepam  (RESTORIL ) 15 MG capsule 2 qhs 15 capsule 4   No current facility-administered medications for this visit.    Musculoskeletal: Strength & Muscle Tone: within normal limits Gait & Station: normal Patient leans: N/A  Psychiatric Specialty Exam: Review of Systems  Blood pressure 110/81, pulse 78, height 5' 1 (1.549 m), weight 170 lb (77.1 kg).Body mass index is 32.12 kg/m.  General Appearance: Casual  Eye Contact:  Good  Speech:  NA  Volume:  Normal  Mood:  Anxious  Affect:  Appropriate  Thought Process:  Coherent  Orientation:  Full (Time, Place, and Person)  Thought Content:  WDL  Suicidal Thoughts:  No  Homicidal Thoughts:  No  Memory:  NA  Judgement:  Good  Insight:  Good  Psychomotor Activity:  Normal  Concentration:    Recall:  NA  Fund of Knowledge:Good  Language: Good  Akathisia:  NA  Handed:  Left  AIMS (if indicated):  not done  Assets:  Desire for Improvement  ADL's:  Intact  Cognition: WNL  Sleep:  Poor   Screenings: GAD-7    Flowsheet Row Office Visit from 02/14/2024 in [facility] [place] Office Visit from 11/21/2023 in [facility] [place] Office Visit from 07/23/2023 in [facility] [place] Office Visit from 04/19/2023 in [facility] [place] Office Visit from 02/21/2023 in [facility] [place]  Total GAD-7 Score 0 0 5  7 16   PHQ2-9    Flowsheet Row Office Visit from 02/14/2024 in [facility] [place] Office Visit from 11/21/2023 in [facility] [place]  Office Visit from 07/23/2023 in [facility] [place] Office Visit from 04/19/2023 in [facility] [employer] Office Visit from 02/21/2023 in [facility] at [place]  PHQ-2 Total Score 0 0 0 2 6  PHQ-9 Total Score 1 0 0 6 18   Flowsheet Row ED from 03/04/2024 in [facility] Emergency Department at [facility] ED from 05/22/2022 in [facility] Emergency Department at [facility] ED to Hosp-Admission (Discharged) from 05/14/2022 in [place] LONG 4TH FLOOR PROGRESSIVE CARE AND UROLOGY  C-SSRS RISK CATEGORY No Risk No Risk No Risk    Assessment and Plan:     Today the patient is doing well.  She will continue taking Wellbutrin  150 mg for major depression which I think is in remission at this time.  Her second problem is insomnia and she takes Restoril  15 mg on a as needed basis her third problem is an adjustment disorder with an anxious mood state she takes a small dose of BuSpar  5 mg a day in the spring.  The most important intervention I think is being in therapy.  She will return to see me in 4 months.  Hopefully by that time the trial will be over and she will be able to get back into the world of employment.  Collaboration of Care:   Patient/Guardian was advised Release of Information must be obtained prior to any record release in order to collaborate their care with an outside provider. Patient/Guardian was advised if they have not already done so to contact the registration department to sign all necessary forms in order for us  to release information regarding their care.   Consent: Patient/Guardian gives verbal consent for treatment and assignment of benefits for services provided during this visit. Patient/Guardian expressed understanding and agreed to proceed.   Elna LILLETTE Lo, MD 7/2/20252:06 PM

## 2024-03-25 ENCOUNTER — Other Ambulatory Visit: Payer: Self-pay | Admitting: Internal Medicine

## 2024-03-25 DIAGNOSIS — E1169 Type 2 diabetes mellitus with other specified complication: Secondary | ICD-10-CM

## 2024-03-27 NOTE — Progress Notes (Signed)
 Tracey Beard  Tracey Beard 43 year old female Tracey Beard   HOME SLEEP TEST REPORT ( by Tracey Beard  mail -out device )    Study Protocol:     The Tracey Beard single-point-of-skin-contact chest-worn sensor - an FDA cleared and DOT approved type 4 home sleep test device - measures eight physiological channels,  including blood oxygen saturation (measured via PPG [photoplethysmography]), EKG-derived heart rate, respiratory effort, chest movement (measured via accelerometer), snoring, body position, and actigraphy. The device is designed to be worn for up to 10 hours per study.    STUDY DATE:  03-19-2024 Data received : 03-27-2024, PM    ORDERING CLINICIAN: Dedra Beard ,MD  REFERRING CLINICIAN: PCP [name], MD and  Tracey Beard    CLINICAL INFORMATION/HISTORY: episodic cluster headaches and  tension headaches, background migraine. History of IIH ( pseudotumor cerebri) , DM, HTN, major depression.  Tracey Beard is a 43 y.o. female patient who is seen upon a new referral on 02/05/2024 from Tracey Beard  for a new  Headache evaluation (that will over lap with a sleep consult.). When we met for the first time  that consult was directed to insomnia.    Epworth sleepiness score: 3/ 24 points   FSS endorsed at 13/ 63 points.  Significant decrease form 63/ 63 in 2024.   BMI:34  kg/m  Neck Circumference: 13.5    FINDINGS:  Sleep Summary:   Total Recording Time (hours, min): 8 hours 50 minutes       Total Sleep Time (hours, min): 7 hours 27 minutes with an effective sleep time of 7 hours 9 minutes.  Sleep latency of 2 minutes. Sleep efficiency %;    84%                                   Respiratory Indices by AASM criteria of scoring;    Calculated pAHI (per hour):   7.3/h                                              Positional  respiratory activity  / snoring : Surprisingly the highest AHI was seen during 30 minutes of right lateral sleep.  I suspect that  this may correlate to REM sleep onset.  For the majority of the night the patient was in supine sleep position.  Oxygen Saturation  in Sleep    Oxygen Saturation (%) Mean: 95%                 O2 Saturation Range (%):    Between 78% at nadir and the maximum of 99.3%                                   O2 Saturation (minutes) <89%:    0 minutes       Pulse Rate in Sleep :   Pulse Mean (bpm):    75 bpm             Pulse Range:    Between 63 and 92 bpm , in normal sinus rhythm.            IMPRESSION:  This HST confirms the presence of very mild  sleep apnea with very little evidence of snoring.  The patient mostly slept well in supine position changes in sleep position seem to occur after arousal from sleep and not in sleep.     RECOMMENDATION:  For the mild degree of apnea in his patient she does not need to use a CPAP but could be treated with a dental device , avoid right lateral sleep and she should pursue weight loss. Her multiple headache types are not explained by these findings.    Any Patient endorsing a high level of sleepiness should be cautioned not to drive, work at heights, or operate dangerous machinery or heavy equipment when tired or sleepy.  Review of good sleep hygiene measures took place in the initial consultation but should be revisited ( Your guide to better sleep  a publication by the NIH is a good source of information).   The referring provider will be notified of the test results.    I certify that I have reviewed the raw data recording prior to the issuance of this report in accordance with the standards of the American Academy of Sleep Medicine (AASM).    I NTERPRETING PHYSICIAN:   Tracey Gores, MD  [facility] Board certified by The [employer] of Sleep Medicine and Diplomate of the [employer] of Sleep Medicine. Board certified In Neurology through the ABPN, Fellow of the [employer] of Neurology.

## 2024-03-31 ENCOUNTER — Telehealth: Payer: Self-pay | Admitting: *Deleted

## 2024-03-31 ENCOUNTER — Ambulatory Visit: Payer: Self-pay | Admitting: Neurology

## 2024-03-31 NOTE — Telephone Encounter (Signed)
 Spoke to the patient and an appointment was scheduled.

## 2024-03-31 NOTE — Telephone Encounter (Signed)
 Reason for CRM: patient has questions and concerns in regards to current medication ozempic . She has been having extreme hunger and possibly gained over 15 lbs within a month. Overall stressed about weight gain. You can contact her via phone at [phone]

## 2024-03-31 NOTE — Procedures (Signed)
 [facility]  Tracey Beard 43 year old female 21-Nov-1980   HOME SLEEP TEST REPORT ( by [name]  mail -out device )    Study Protocol:     The SANSA single-point-of-skin-contact chest-worn sensor - an FDA cleared and DOT approved type 4 home sleep test device - measures eight physiological channels,  including blood oxygen saturation (measured via PPG [photoplethysmography]), EKG-derived heart rate, respiratory effort, chest movement (measured via accelerometer), snoring, body position, and actigraphy. The device is designed to be worn for up to 10 hours per study.    STUDY DATE:  03-19-2024 Data received : 03-27-2024, PM    ORDERING CLINICIAN: Dedra Gores ,MD  REFERRING CLINICIAN: PCP [name], MD and  Dr Tasia    CLINICAL INFORMATION/HISTORY: episodic cluster headaches and  tension headaches, background migraine. History of IIH ( pseudotumor cerebri) , DM, HTN, major depression.  Tracey Beard is a 43 y.o. female patient who is seen upon a new referral on 02/05/2024 from Dr TheophilusGLENWOOD Andrews  for a new  Headache evaluation (that will over lap with a sleep consult.). When we met for the first time  that consult was directed to insomnia.    Epworth sleepiness score: 3/ 24 points   FSS endorsed at 13/ 63 points.  Significant decrease form 63/ 63 in 2024.   BMI:34  kg/m  Neck Circumference: 13.5    FINDINGS:  Sleep Summary:   Total Recording Time (hours, min): 8 hours 50 minutes       Total Sleep Time (hours, min): 7 hours 27 minutes with an effective sleep time of 7 hours 9 minutes.  Sleep latency of 2 minutes. Sleep efficiency %;    84%                                   Respiratory Indices by AASM criteria of scoring;    Calculated pAHI (per hour):   7.3/h                                              Positional  respiratory activity  / snoring : Surprisingly the highest AHI was seen during 30 minutes of right lateral sleep.  I suspect that this  may correlate to REM sleep onset.  For the majority of the night the patient was in supine sleep position.  Oxygen Saturation  in Sleep    Oxygen Saturation (%) Mean: 95%                 O2 Saturation Range (%):    Between 78% at nadir and the maximum of 99.3%                                   O2 Saturation (minutes) <89%:    0 minutes       Pulse Rate in Sleep :   Pulse Mean (bpm):    75 bpm             Pulse Range:    Between 63 and 92 bpm , in normal sinus rhythm.            IMPRESSION:  This HST confirms the presence of very mild sleep apnea  with very little evidence of snoring.  The patient mostly slept well in supine position changes in sleep position seem to occur after arousal from sleep and not in sleep.     RECOMMENDATION:  For the mild degree of apnea in his patient she does not need to use a CPAP but could be treated with a dental device , avoid right lateral sleep and she should pursue weight loss. Her multiple headache types are not explained by these findings.    Any Patient endorsing a high level of sleepiness should be cautioned not to drive, work at heights, or operate dangerous machinery or heavy equipment when tired or sleepy.  Review of good sleep hygiene measures took place in the initial consultation but should be revisited ( Your guide to better sleep  a publication by the NIH is a good source of information).   The referring provider will be notified of the test results.    I certify that I have reviewed the raw data recording prior to the issuance of this report in accordance with the standards of the American Academy of Sleep Medicine (AASM).    I NTERPRETING PHYSICIAN:   Dedra Gores, MD  [facility] Board certified by The [employer] of Sleep Medicine and Diplomate of the [employer] of Sleep Medicine. Board certified In Neurology through the ABPN, Fellow of the [employer] of Neurology.

## 2024-04-01 NOTE — Telephone Encounter (Signed)
-----   Message from Delphos Dohmeier sent at 03/31/2024  6:39 PM EDT ----- Mild apnea, we can offer CPAP but I doubt in the absence of sleep hypoxia that this will help her much, consider dental device instead.  Weight loss is important.  ----- Message ----- From: Chalice Saunas, MD Sent: 03/31/2024   6:36 PM EDT To: Saunas Chalice, MD

## 2024-04-01 NOTE — Telephone Encounter (Signed)
 Called the patient and was able to review the sleep study results with her in detail. Informed that there was overall mild sleep apnea. Advised that we were able to rule out that OSA is not cause of headaches. Pt states she never received a call to get him set up with a MRI that was ordered. Provided the pt with [place] imaging phone number for her to call and schedule. She was appreciative.

## 2024-04-03 ENCOUNTER — Ambulatory Visit: Admitting: Internal Medicine

## 2024-04-03 ENCOUNTER — Encounter: Payer: Self-pay | Admitting: Internal Medicine

## 2024-04-03 VITALS — BP 110/80 | HR 80 | Temp 97.8°F | Wt 178.9 lb

## 2024-04-03 DIAGNOSIS — Z6834 Body mass index (BMI) 34.0-34.9, adult: Secondary | ICD-10-CM

## 2024-04-03 DIAGNOSIS — E6609 Other obesity due to excess calories: Secondary | ICD-10-CM | POA: Diagnosis not present

## 2024-04-03 DIAGNOSIS — E1169 Type 2 diabetes mellitus with other specified complication: Secondary | ICD-10-CM | POA: Diagnosis not present

## 2024-04-03 DIAGNOSIS — E66811 Obesity, class 1: Secondary | ICD-10-CM | POA: Diagnosis not present

## 2024-04-03 MED ORDER — SEMAGLUTIDE (2 MG/DOSE) 8 MG/3ML ~~LOC~~ SOPN: 2.0000 mg | PEN_INJECTOR | SUBCUTANEOUS | 2 refills | Status: DC

## 2024-04-03 MED ORDER — ONDANSETRON 4 MG PO TBDP: 4.0000 mg | ORAL_TABLET | Freq: Three times a day (TID) | ORAL | 0 refills | Status: DC | PRN

## 2024-04-03 NOTE — Progress Notes (Signed)
 Established Patient Office Visit     CC/Reason for Visit: Discuss Ozempic  dosing  HPI: Tracey Beard is a 43 y.o. female who is coming in today for the above mentioned reasons. Past Medical History is significant for: Type 2 diabetes and morbid obesity.  She has been on Ozempic  1 mg now for some time.  Despite her A1c being well-controlled at 5.3, she has had an increase in weight and appetite.   Past Medical/Surgical History: Past Medical History:  Diagnosis Date   Allergy    Anxiety    Arthritis    Chronic back pain    Depression    GERD (gastroesophageal reflux disease)    Gestational diabetes    glyburide    Headache    Heart murmur    Hypertension    Idiopathic intracranial hypertension    Leukocytosis 02/25/2015   Obesity    Pregnancy induced hypertension    labetalol     Past Surgical History:  Procedure Laterality Date   CESAREAN SECTION N/A 01/29/2019   Procedure: CESAREAN SECTION;  Surgeon: Dannielle Bouchard, DO;  Location: [facility];  Service: Obstetrics;  Laterality: N/A;   CHOLECYSTECTOMY     LUMBAR PUNCTURE  2014   5x    Social History:  reports that she has never smoked. She has never used smokeless tobacco. She reports that she does not currently use alcohol. She reports that she does not use drugs.  Allergies: Allergies  Allergen Reactions   Elavil [Amitriptyline Hcl] Shortness Of Breath   Restoril  [Temazepam ] Shortness Of Breath   Trazodone And Nefazodone Anaphylaxis   Diamox [Acetazolamide] Other (See Comments)    Cold chills/loss of taste    Tomato Swelling and Other (See Comments)    Causes lips to swell   Norco [Hydrocodone -Acetaminophen ] Itching   Pollen Extract Other (See Comments)    Unknown reaction    Topamax [Topiramate] Other (See Comments)    Chest tightness   Benzocaine Swelling, Rash and Other (See Comments)    Localized swelling   Latex Rash   Novocain [Procaine] Swelling, Rash and Other (See Comments)    Localized  swelling    Family History:  Family History  Problem Relation Age of Onset   Hypertension Mother    Breast cancer Mother    Pancreatic cancer Mother    Diabetes Mother    Cancer Mother    Hypertension Father    Hypertension Maternal Grandmother    Heart disease Maternal Grandmother    Arthritis Maternal Grandmother    Hypertension Maternal Grandfather    Diabetes Maternal Grandfather    Colon cancer Paternal Grandmother    Heart disease Paternal Grandfather    Hypertension Maternal Aunt    Breast cancer Maternal Aunt    Obesity Maternal Aunt    Diabetes Maternal Aunt    Hypertension Maternal Uncle    Liver cancer Maternal Uncle    Alcohol abuse Maternal Uncle    Prostate cancer Maternal Uncle    Miscarriages / Stillbirths Daughter    Asthma Brother    Cancer Maternal Aunt      Current Outpatient Medications:    buPROPion  (WELLBUTRIN  XL) 150 MG 24 hr tablet, 1 qam  for 1 week then 2 qam, Disp: 180 tablet, Rfl: 2   busPIRone  (BUSPAR ) 5 MG tablet, 1 TID, Disp: 270 tablet, Rfl: 1   Continuous Glucose Receiver (FREESTYLE LIBRE 3 READER) DEVI, 1 each by Does not apply route See admin instructions., Disp: 2 each, Rfl: 3  Continuous Glucose Sensor (FREESTYLE LIBRE 3 SENSOR) MISC, 1 each by Does not apply route See admin instructions. Place 1 sensor on the skin every 14 days. Use to check glucose continuously, Disp: 2 each, Rfl: 3   esomeprazole  (NEXIUM ) 40 MG capsule, Take 1 capsule (40 mg total) by mouth 2 (two) times daily before a meal., Disp: 60 capsule, Rfl: 5   Fremanezumab -vfrm (AJOVY ) 225 MG/1.5ML SOAJ, Inject 225 mg into the skin every 30 (thirty) days., Disp: 1.5 mL, Rfl: 5   ondansetron  (ZOFRAN ) 4 MG tablet, Take 1 tablet (4 mg total) by mouth every 6 (six) hours., Disp: 12 tablet, Rfl: 0   propranolol  ER (INDERAL  LA) 80 MG 24 hr capsule, Take 1 capsule (80 mg total) by mouth at bedtime., Disp: 90 capsule, Rfl: 1   rosuvastatin  (CRESTOR ) 5 MG tablet, Take 1 tablet (5 mg  total) by mouth daily., Disp: 90 tablet, Rfl: 1   Semaglutide , 2 MG/DOSE, 8 MG/3ML SOPN, Inject 2 mg as directed once a week., Disp: 3 mL, Rfl: 2   temazepam  (RESTORIL ) 15 MG capsule, 2 qhs, Disp: 15 capsule, Rfl: 4   triamterene -hydrochlorothiazide  (MAXZIDE ) 75-50 MG tablet, Take 1 tablet by mouth daily., Disp: 90 tablet, Rfl: 1   Vitamin D , Ergocalciferol , (DRISDOL ) 1.25 MG (50000 UNIT) CAPS capsule, Take 1 capsule (50,000 Units total) by mouth every 7 (seven) days for 12 doses., Disp: 12 capsule, Rfl: 0   methocarbamol  (ROBAXIN ) 500 MG tablet, Take 1 tablet (500 mg total) by mouth every 8 (eight) hours as needed for muscle spasms., Disp: 30 tablet, Rfl: 0   ondansetron  (ZOFRAN -ODT) 4 MG disintegrating tablet, Take 1 tablet (4 mg total) by mouth every 8 (eight) hours as needed for nausea or vomiting., Disp: 20 tablet, Rfl: 0   sucralfate  (CARAFATE ) 1 g tablet, Take 1 tablet (1 g total) by mouth 4 (four) times daily as needed., Disp: 30 tablet, Rfl: 0  Review of Systems:  Negative unless indicated in HPI.   Physical Exam: Vitals:   04/03/24 1118  BP: 110/80  Pulse: 80  Temp: 97.8 F (36.6 C)  TempSrc: Oral  SpO2: 100%  Weight: 178 lb 14.4 oz (81.1 kg)    Body mass index is 33.8 kg/m.    Impression and Plan:  Class 1 obesity due to excess calories with serious comorbidity and body mass index (BMI) of 34.0 to 34.9 in adult  Type 2 diabetes mellitus with other specified complication, without long-term current use of insulin  (HCC) -     Ondansetron ; Take 1 tablet (4 mg total) by mouth every 8 (eight) hours as needed for nausea or vomiting.  Dispense: 20 tablet; Refill: 0 -     Semaglutide  (2 MG/DOSE); Inject 2 mg as directed once a week.  Dispense: 3 mL; Refill: 2   - Will increase Ozempic  dose to 2 mg weekly.  Follow-up in 3 months.  Time spent:30 minutes reviewing chart, interviewing and examining patient and formulating plan of care.     Tully Theophilus Andrews,  MD [facility]

## 2024-04-04 ENCOUNTER — Encounter: Payer: Self-pay | Admitting: Internal Medicine

## 2024-04-07 ENCOUNTER — Telehealth: Payer: Self-pay | Admitting: *Deleted

## 2024-04-07 ENCOUNTER — Encounter: Payer: Self-pay | Admitting: Neurology

## 2024-04-07 NOTE — Telephone Encounter (Signed)
 Copied from CRM [id number]. Topic: Clinical - Request for Lab/Test Order >> Apr 04, 2024 11:15 AM Thersia BROCKS wrote: Reason for CRM: Patient called in wanting to know if she could get lab work done for full STI panel >> Apr 04, 2024  1:52 PM Margarett M wrote: Call pt and left message to call back to make a appt  Answered in Mychart.

## 2024-04-08 ENCOUNTER — Ambulatory Visit (HOSPITAL_COMMUNITY): Admitting: Licensed Clinical Social Worker

## 2024-04-08 ENCOUNTER — Encounter: Payer: Self-pay | Admitting: Internal Medicine

## 2024-04-08 ENCOUNTER — Encounter (HOSPITAL_COMMUNITY): Payer: Self-pay

## 2024-04-08 ENCOUNTER — Other Ambulatory Visit (HOSPITAL_COMMUNITY)
Admission: RE | Admit: 2024-04-08 | Discharge: 2024-04-08 | Disposition: A | Discharge status: Home / Self Care | Source: Ambulatory Visit | Attending: Internal Medicine | Admitting: Internal Medicine

## 2024-04-08 ENCOUNTER — Ambulatory Visit (INDEPENDENT_AMBULATORY_CARE_PROVIDER_SITE_OTHER): Admitting: Licensed Clinical Social Worker

## 2024-04-08 ENCOUNTER — Ambulatory Visit: Admitting: Internal Medicine

## 2024-04-08 ENCOUNTER — Encounter (HOSPITAL_COMMUNITY): Payer: Self-pay | Admitting: Licensed Clinical Social Worker

## 2024-04-08 VITALS — BP 110/80 | Wt 178.0 lb

## 2024-04-08 DIAGNOSIS — Z113 Encounter for screening for infections with a predominantly sexual mode of transmission: Secondary | ICD-10-CM | POA: Insufficient documentation

## 2024-04-08 DIAGNOSIS — F4310 Post-traumatic stress disorder, unspecified: Secondary | ICD-10-CM | POA: Diagnosis not present

## 2024-04-08 DIAGNOSIS — E559 Vitamin D deficiency, unspecified: Secondary | ICD-10-CM

## 2024-04-08 DIAGNOSIS — F330 Major depressive disorder, recurrent, mild: Secondary | ICD-10-CM

## 2024-04-08 DIAGNOSIS — E1169 Type 2 diabetes mellitus with other specified complication: Secondary | ICD-10-CM | POA: Diagnosis not present

## 2024-04-08 DIAGNOSIS — E785 Hyperlipidemia, unspecified: Secondary | ICD-10-CM | POA: Diagnosis not present

## 2024-04-08 LAB — LIPID PANEL
Cholesterol: 141 mg/dL (ref 0–200)
HDL: 62.4 mg/dL (ref >39.00)
LDL Cholesterol: 60 mg/dL (ref 0–99)
NonHDL: 78.71
Total CHOL/HDL Ratio: 2
Triglycerides: 94 mg/dL (ref 0.0–149.0)
VLDL: 18.8 mg/dL (ref 0.0–40.0)

## 2024-04-08 LAB — VITAMIN D 25 HYDROXY (VIT D DEFICIENCY, FRACTURES): VITD: 47.14 ng/mL (ref 30.00–100.00)

## 2024-04-08 NOTE — Progress Notes (Signed)
 Established Patient Office Visit     CC/Reason for Visit: STD screening  HPI: Lucretia Pendley is a 43 y.o. female who is coming in today for the above mentioned reasons.  No symptoms.   Past Medical/Surgical History: Past Medical History:  Diagnosis Date   Allergy    Anxiety    Arthritis    Chronic back pain    Depression    GERD (gastroesophageal reflux disease)    Gestational diabetes    glyburide    Headache    Heart murmur    Hypertension    Idiopathic intracranial hypertension    Leukocytosis 02/25/2015   Obesity    Pregnancy induced hypertension    labetalol     Past Surgical History:  Procedure Laterality Date   CESAREAN SECTION N/A 01/29/2019   Procedure: CESAREAN SECTION;  Surgeon: Dannielle Bouchard, DO;  Location: [facility];  Service: Obstetrics;  Laterality: N/A;   CHOLECYSTECTOMY     LUMBAR PUNCTURE  2014   5x    Social History:  reports that she has never smoked. She has never used smokeless tobacco. She reports that she does not currently use alcohol. She reports that she does not use drugs.  Allergies: Allergies  Allergen Reactions   Elavil [Amitriptyline Hcl] Shortness Of Breath   Restoril  [Temazepam ] Shortness Of Breath   Trazodone And Nefazodone Anaphylaxis   Diamox [Acetazolamide] Other (See Comments)    Cold chills/loss of taste    Tomato Swelling and Other (See Comments)    Causes lips to swell   Norco [Hydrocodone -Acetaminophen ] Itching   Pollen Extract Other (See Comments)    Unknown reaction    Topamax [Topiramate] Other (See Comments)    Chest tightness   Benzocaine Swelling, Rash and Other (See Comments)    Localized swelling   Latex Rash   Novocain [Procaine] Swelling, Rash and Other (See Comments)    Localized swelling    Family History:  Family History  Problem Relation Age of Onset   Hypertension Mother    Breast cancer Mother    Pancreatic cancer Mother    Diabetes Mother    Cancer Mother    Hypertension Father     Hypertension Maternal Grandmother    Heart disease Maternal Grandmother    Arthritis Maternal Grandmother    Hypertension Maternal Grandfather    Diabetes Maternal Grandfather    Colon cancer Paternal Grandmother    Heart disease Paternal Grandfather    Hypertension Maternal Aunt    Breast cancer Maternal Aunt    Obesity Maternal Aunt    Diabetes Maternal Aunt    Hypertension Maternal Uncle    Liver cancer Maternal Uncle    Alcohol abuse Maternal Uncle    Prostate cancer Maternal Uncle    Miscarriages / Stillbirths Daughter    Asthma Brother    Cancer Maternal Aunt      Current Outpatient Medications:    buPROPion  (WELLBUTRIN  XL) 150 MG 24 hr tablet, 1 qam  for 1 week then 2 qam, Disp: 180 tablet, Rfl: 2   busPIRone  (BUSPAR ) 5 MG tablet, 1 TID, Disp: 270 tablet, Rfl: 1   Continuous Glucose Receiver (FREESTYLE LIBRE 3 READER) DEVI, 1 each by Does not apply route See admin instructions., Disp: 2 each, Rfl: 3   Continuous Glucose Sensor (FREESTYLE LIBRE 3 SENSOR) MISC, 1 each by Does not apply route See admin instructions. Place 1 sensor on the skin every 14 days. Use to check glucose continuously, Disp: 2 each, Rfl: 3  esomeprazole  (NEXIUM ) 40 MG capsule, Take 1 capsule (40 mg total) by mouth 2 (two) times daily before a meal., Disp: 60 capsule, Rfl: 5   Fremanezumab -vfrm (AJOVY ) 225 MG/1.5ML SOAJ, Inject 225 mg into the skin every 30 (thirty) days., Disp: 1.5 mL, Rfl: 5   ondansetron  (ZOFRAN ) 4 MG tablet, Take 1 tablet (4 mg total) by mouth every 6 (six) hours., Disp: 12 tablet, Rfl: 0   ondansetron  (ZOFRAN -ODT) 4 MG disintegrating tablet, Take 1 tablet (4 mg total) by mouth every 8 (eight) hours as needed for nausea or vomiting., Disp: 20 tablet, Rfl: 0   propranolol  ER (INDERAL  LA) 80 MG 24 hr capsule, Take 1 capsule (80 mg total) by mouth at bedtime., Disp: 90 capsule, Rfl: 1   rosuvastatin  (CRESTOR ) 5 MG tablet, Take 1 tablet (5 mg total) by mouth daily., Disp: 90 tablet, Rfl: 1    Semaglutide , 2 MG/DOSE, 8 MG/3ML SOPN, Inject 2 mg as directed once a week., Disp: 3 mL, Rfl: 2   temazepam  (RESTORIL ) 15 MG capsule, 2 qhs, Disp: 15 capsule, Rfl: 4   triamterene -hydrochlorothiazide  (MAXZIDE ) 75-50 MG tablet, Take 1 tablet by mouth daily., Disp: 90 tablet, Rfl: 1   Vitamin D , Ergocalciferol , (DRISDOL ) 1.25 MG (50000 UNIT) CAPS capsule, Take 1 capsule (50,000 Units total) by mouth every 7 (seven) days for 12 doses., Disp: 12 capsule, Rfl: 0   methocarbamol  (ROBAXIN ) 500 MG tablet, Take 1 tablet (500 mg total) by mouth every 8 (eight) hours as needed for muscle spasms., Disp: 30 tablet, Rfl: 0   sucralfate  (CARAFATE ) 1 g tablet, Take 1 tablet (1 g total) by mouth 4 (four) times daily as needed., Disp: 30 tablet, Rfl: 0  Review of Systems:  Negative unless indicated in HPI.   Physical Exam: Vitals:   04/08/24 1100  BP: 110/80  Weight: 178 lb (80.7 kg)    Body mass index is 33.63 kg/m.   Physical Exam Exam conducted with a chaperone present.  Genitourinary:    Vagina: Normal.     Cervix: Normal.      Impression and Plan:  Screen for STD (sexually transmitted disease) -     Hepatitis panel, acute; Future -     RPR; Future -     HIV Antibody (routine testing w rflx); Future -     Cervicovaginal ancillary only   - Speculum exam performed today, wet prep and labs ordered.  Time spent:21 minutes reviewing chart, interviewing and examining patient and formulating plan of care.     Tully Theophilus Andrews, MD [facility] at [facility]

## 2024-04-08 NOTE — Progress Notes (Signed)
   THERAPIST PROGRESS NOTE  Session Time: 8:00am-8:55am  Participation Level: Active  Behavioral Response: Well GroomedAlertDepressed and Euthymic  Type of Therapy: Individual Therapy  Treatment Goals addressed:  TG: Reduce frequency, intensity, and duration of depression symptoms so that daily functioning is improved (OP Depression)  Disciplines:  Interdisciplinary, PROVIDER    Expected end:  03/17/24          LTG: Increase coping skills to manage depression and improve ability to perform daily activities (OP Depression)  Disciplines:  Interdisciplinary, PROVIDER    Expected end:  03/17/24       ProgressTowards Goals: Progressing  Interventions: CBT  Summary: Emalina Dubreuil is a 43 y.o. female who presents with MDD, recurrent, mild and PTSD.   Suicidal/Homicidal: Nowithout intent/plan  Therapist Response: Lari engaged well in individual in person session with clinician. Clinician utilized CBT to process thoughts, feelings, and interactions. Clinician explored updates at home, with husband and son. Clinician also explored Maytal's mental health, noting that continuing headaches/migraines occur less frequently, but still several times per week. Clinician explored the impact of the weather on headaches, as well as on her mood. Teandra shared some more depression like sxs, including less motivation to use her coping skills, less mindfulness, and increased interest in getting away. Clinician processed the increasing need and urge for change. Clinician explored updates with court and noted that there will be another continuance in September. Clinician explored PTSD sxs and noted concerns about short term memory loss or lack of absorption of memories, even regarding a movie watched the other day. Kalene has an MRI scheduled for next week re: migraines.   Plan: Return again in 2-3 weeks.  Diagnosis: MDD (major depressive disorder), recurrent episode, mild (HCC)  PTSD (post-traumatic  stress disorder)  Collaboration of Care: Psychiatrist AEB updated Dr. Tasia  Patient/Guardian was advised Release of Information must be obtained prior to any record release in order to collaborate their care with an outside provider. Patient/Guardian was advised if they have not already done so to contact the registration department to sign all necessary forms in order for us  to release information regarding their care.   Consent: Patient/Guardian gives verbal consent for treatment and assignment of benefits for services provided during this visit. Patient/Guardian expressed understanding and agreed to proceed.   [name] [place], LCSW 04/08/2024

## 2024-04-09 LAB — CERVICOVAGINAL ANCILLARY ONLY
Bacterial Vaginitis (gardnerella): POSITIVE — AB
Candida Glabrata: NEGATIVE
Candida Vaginitis: NEGATIVE
Chlamydia: NEGATIVE
Comment: NEGATIVE
Comment: NEGATIVE
Comment: NEGATIVE
Comment: NEGATIVE
Comment: NEGATIVE
Comment: NORMAL
Neisseria Gonorrhea: NEGATIVE
Trichomonas: NEGATIVE

## 2024-04-09 LAB — RPR: RPR Ser Ql: NONREACTIVE

## 2024-04-09 LAB — HEPATITIS PANEL, ACUTE
Hep A IgM: NONREACTIVE
Hep B C IgM: NONREACTIVE
Hepatitis B Surface Ag: NONREACTIVE
Hepatitis C Ab: NONREACTIVE

## 2024-04-09 LAB — HIV ANTIBODY (ROUTINE TESTING W REFLEX): HIV 1&2 Ab, 4th Generation: NONREACTIVE

## 2024-04-11 ENCOUNTER — Encounter: Payer: Self-pay | Admitting: Internal Medicine

## 2024-04-15 ENCOUNTER — Encounter: Payer: Self-pay | Admitting: Internal Medicine

## 2024-04-15 ENCOUNTER — Ambulatory Visit: Payer: Self-pay | Admitting: Internal Medicine

## 2024-04-15 DIAGNOSIS — B9689 Other specified bacterial agents as the cause of diseases classified elsewhere: Secondary | ICD-10-CM

## 2024-04-15 MED ORDER — METRONIDAZOLE 500 MG PO TABS: 2000.0000 mg | ORAL_TABLET | Freq: Once | ORAL | 0 refills | Status: AC

## 2024-04-16 ENCOUNTER — Other Ambulatory Visit: Payer: Self-pay | Admitting: Internal Medicine

## 2024-04-17 ENCOUNTER — Ambulatory Visit
Admission: RE | Admit: 2024-04-17 | Discharge: 2024-04-17 | Disposition: A | Discharge status: Home / Self Care | Source: Ambulatory Visit | Attending: Neurology | Admitting: Neurology

## 2024-04-17 DIAGNOSIS — G44219 Episodic tension-type headache, not intractable: Secondary | ICD-10-CM

## 2024-04-17 MED ORDER — GADOPICLENOL 0.5 MMOL/ML IV SOLN
8.0000 mL | Freq: Once | INTRAVENOUS | Status: AC | PRN
  Administered 2024-04-17: 8 mL via INTRAVENOUS

## 2024-04-21 ENCOUNTER — Other Ambulatory Visit: Payer: Self-pay | Admitting: Neurology

## 2024-04-22 ENCOUNTER — Ambulatory Visit (HOSPITAL_COMMUNITY): Admitting: Licensed Clinical Social Worker

## 2024-04-23 ENCOUNTER — Other Ambulatory Visit: Payer: Self-pay | Admitting: Neurology

## 2024-04-23 ENCOUNTER — Ambulatory Visit
Admission: RE | Admit: 2024-04-23 | Discharge: 2024-04-23 | Disposition: A | Discharge status: Home / Self Care | Source: Ambulatory Visit | Attending: Internal Medicine | Admitting: Internal Medicine

## 2024-04-23 DIAGNOSIS — Z1231 Encounter for screening mammogram for malignant neoplasm of breast: Secondary | ICD-10-CM

## 2024-04-23 DIAGNOSIS — G932 Benign intracranial hypertension: Secondary | ICD-10-CM

## 2024-04-23 DIAGNOSIS — G43701 Chronic migraine without aura, not intractable, with status migrainosus: Secondary | ICD-10-CM

## 2024-04-23 DIAGNOSIS — G44219 Episodic tension-type headache, not intractable: Secondary | ICD-10-CM

## 2024-04-23 MED ORDER — ALPRAZOLAM 0.5 MG PO TABS: 0.5000 mg | ORAL_TABLET | Freq: Every evening | ORAL | 0 refills | Status: DC | PRN

## 2024-04-23 NOTE — Telephone Encounter (Signed)
Order sent to MD.

## 2024-04-23 NOTE — Telephone Encounter (Signed)
 Pt had a lumbar Puncture scheduled for today  , But could not go thru with it because of anxiety . Nurse informed Pt to call MD to get medication for appt next week . Lumbar Puncture specialist  will call pt to reschedule appt .  Pharmacy  [facility] - [place], [place] - [id number]  [address] Phone: [phone]  Fax: [phone]

## 2024-04-24 ENCOUNTER — Inpatient Hospital Stay: Admission: RE | Admit: 2024-04-24 | Source: Ambulatory Visit

## 2024-04-29 NOTE — Discharge Instructions (Signed)

## 2024-04-30 ENCOUNTER — Ambulatory Visit: Payer: Self-pay | Admitting: Neurology

## 2024-04-30 ENCOUNTER — Ambulatory Visit
Admission: RE | Admit: 2024-04-30 | Discharge: 2024-04-30 | Disposition: A | Discharge status: Home / Self Care | Source: Ambulatory Visit | Attending: Neurology | Admitting: Neurology

## 2024-04-30 DIAGNOSIS — G44219 Episodic tension-type headache, not intractable: Secondary | ICD-10-CM

## 2024-04-30 DIAGNOSIS — G932 Benign intracranial hypertension: Secondary | ICD-10-CM

## 2024-04-30 DIAGNOSIS — G43701 Chronic migraine without aura, not intractable, with status migrainosus: Secondary | ICD-10-CM

## 2024-04-30 NOTE — Procedures (Signed)
 PROCEDURE SUMMARY:  Successful image-guided lumbar puncture at level of L3-4.  Opening pressure 23 cm water.  Yielded approximately 12  mL of clear, colorless CSF.  Closing pressure 12 cm water. No immediate complications.  EBL = trace. Patient tolerated well.   Specimen was sent for labs.  Please see imaging section of Epic for full dictation.    Leanard Dimaio H Gatsby Chismar PA-C 04/30/2024 9:06 AM

## 2024-05-01 ENCOUNTER — Encounter (HOSPITAL_COMMUNITY): Payer: Self-pay | Admitting: Licensed Clinical Social Worker

## 2024-05-01 ENCOUNTER — Ambulatory Visit (INDEPENDENT_AMBULATORY_CARE_PROVIDER_SITE_OTHER): Admitting: Licensed Clinical Social Worker

## 2024-05-01 DIAGNOSIS — F330 Major depressive disorder, recurrent, mild: Secondary | ICD-10-CM

## 2024-05-01 NOTE — Progress Notes (Signed)
 Virtual Visit via Video Note  I connected with Jung Ingerson on 05/01/24 at  9:00 AM EDT by a video enabled telemedicine application and verified that I am speaking with the correct person using two identifiers.  Location: Patient: home Provider: home office   I discussed the limitations of evaluation and management by telemedicine and the availability of in person appointments. The patient expressed understanding and agreed to proceed.   I discussed the assessment and treatment plan with the patient. The patient was provided an opportunity to ask questions and all were answered. The patient agreed with the plan and demonstrated an understanding of the instructions.   The patient was advised to call back or seek an in-person evaluation if the symptoms worsen or if the condition fails to improve as anticipated.  I provided 55 minutes of non-face-to-face time during this encounter.   [name], LCSW   THERAPIST PROGRESS NOTE  Session Time: 9:00am-9:55am  Participation Level: Active  Behavioral Response: Well GroomedAlertIrritable  Type of Therapy: Individual Therapy  Treatment Goals addressed:  TG: Reduce frequency, intensity, and duration of depression symptoms so that daily functioning is improved (OP Depression)  Disciplines:  Interdisciplinary, PROVIDER    Expected end:  03/17/24          LTG: Increase coping skills to manage depression and improve ability to perform daily activities (OP Depression)  Disciplines:  Interdisciplinary, PROVIDER    Expected end:  03/17/24       ProgressTowards Goals: Progressing  Interventions: CBT  Summary: Dorsey Charette is a 43 y.o. female who presents with MDD, recurrent, mild.   Suicidal/Homicidal: Nowithout intent/plan  Therapist Response: Doxie engaged well in individual virtual session with Facilities manager. Clinician utilized CBT to discuss thoughts, feelings, and interactions. Clinician explored current levels of  irritability and guilt due to needing a break from being the solo parent. Clinician reflected the feelings of exhaustion and overstimulation, as well as the worry about son starting school. Clinician processed attachment levels and noted that both Stpehanie and son have some insecure or anxious attachment due to being together 24/7 for the past year since son has not been in preschool. Clinician processed ways to address this, including being excited and encouraging about school, friends, and teachers. Clinician also identified ways for Noriah to get involved in the school, as a positive for both her and her son. Clinician noted the value of volunteering, getting to know the teachers and principal, and getting to know other parents who are in the same phase of life. Clinician noted that son has also started flag football and Neve has made connections with several moms through this. Clinician encouraged more socialization with moms due to being in the same phase of life, kids being friends, and having other adults to build a village.   Plan: Return again in 2 weeks.  Diagnosis: MDD (major depressive disorder), recurrent episode, mild (HCC)  Collaboration of Care: Patient refused AEB none required  Patient/Guardian was advised Release of Information must be obtained prior to any record release in order to collaborate their care with an outside provider. Patient/Guardian was advised if they have not already done so to contact the registration department to sign all necessary forms in order for us  to release information regarding their care.   Consent: Patient/Guardian gives verbal consent for treatment and assignment of benefits for services provided during this visit. Patient/Guardian expressed understanding and agreed to proceed.   [name] JONELLE [place], LCSW 05/01/2024

## 2024-05-06 ENCOUNTER — Encounter (HOSPITAL_COMMUNITY): Payer: Self-pay | Admitting: Licensed Clinical Social Worker

## 2024-05-06 ENCOUNTER — Ambulatory Visit (INDEPENDENT_AMBULATORY_CARE_PROVIDER_SITE_OTHER): Admitting: Licensed Clinical Social Worker

## 2024-05-06 ENCOUNTER — Other Ambulatory Visit: Payer: Self-pay | Admitting: Neurology

## 2024-05-06 ENCOUNTER — Other Ambulatory Visit: Payer: Self-pay | Admitting: Internal Medicine

## 2024-05-06 DIAGNOSIS — F330 Major depressive disorder, recurrent, mild: Secondary | ICD-10-CM

## 2024-05-06 DIAGNOSIS — I10 Essential (primary) hypertension: Secondary | ICD-10-CM

## 2024-05-06 DIAGNOSIS — E1169 Type 2 diabetes mellitus with other specified complication: Secondary | ICD-10-CM

## 2024-05-06 NOTE — Progress Notes (Signed)
   THERAPIST PROGRESS NOTE  Session Time: 9:00am-9:45am  Participation Level: Active  Behavioral Response: Well GroomedAlertEuthymic  Type of Therapy: Individual Therapy  Treatment Goals addressed:  TG: Reduce frequency, intensity, and duration of depression symptoms so that daily functioning is improved (OP Depression)  Disciplines:  Interdisciplinary, PROVIDER    Expected end:  03/17/24          LTG: Increase coping skills to manage depression and improve ability to perform daily activities (OP Depression)  Disciplines:  Interdisciplinary, PROVIDER    Expected end:  03/17/24         ProgressTowards Goals: Progressing  Interventions: Motivational Interviewing  Summary: Tracey Beard is a 43 y.o. female who presents with MDD, recurrent, mild.   Suicidal/Homicidal: Nowithout intent/plan  Therapist Response: Tracey Beard engaged well in individual in person session with Tracey Beard. Tracey Beard utilized MI OARS to reflect and summarize thoughts, feelings, and interactions. Tracey Beard shared concerns about sleep and energy level following last week's spinal tap. Tracey Beard encouraged Tracey Beard to follow up with PCP or neurologist to consult on these new sxs. Tracey Beard processed concerns about her health and the medication recommended to her for dealing with migraines. Tracey Beard explored socialization and noted increased willingness to engage with other moms at her son's football practice, PTA at school, and possible peer support. Tracey Beard provided information about classes at [employer], Women's group at the [employer] center, and [employer] group specifically for black women.  Tracey Beard explored Tracey Beard's reactions to stress. Tracey Beard identified the impossibility that she can react or respond appropriately and be completely unbothered 100% of the time. However, Tracey Beard shared that she is good over 85% of the time, which is considered really good most of the time.   Plan: Return again in 3  weeks.  Diagnosis: MDD (major depressive disorder), recurrent episode, mild (HCC)  Collaboration of Care: Psychiatrist AEB shared updates with Dr. Tasia  Patient/Guardian was advised Release of Information must be obtained prior to any record release in order to collaborate their care with an outside provider. Patient/Guardian was advised if they have not already done so to contact the registration department to sign all necessary forms in order for us  to release information regarding their care.   Consent: Patient/Guardian gives verbal consent for treatment and assignment of benefits for services provided during this visit. Patient/Guardian expressed understanding and agreed to proceed.   Tracey Beard SAUNDERS [place], LCSW 05/06/2024

## 2024-05-16 ENCOUNTER — Encounter: Payer: Self-pay | Admitting: Internal Medicine

## 2024-05-20 MED ORDER — TOPIRAMATE 25 MG PO TABS
ORAL_TABLET | ORAL | 0 refills | Status: DC
Start: 2024-05-20 — End: 2024-06-19

## 2024-05-20 NOTE — Addendum Note (Signed)
 Addended by: DELFINO AUGUSTIN BROCKS on: 05/20/2024 09:33 AM   Modules accepted: Orders

## 2024-05-22 ENCOUNTER — Telehealth: Payer: Self-pay | Admitting: *Deleted

## 2024-05-22 NOTE — Telephone Encounter (Signed)
 Copied from CRM [id number]. Topic: Clinical - Medication Question >> May 22, 2024 12:01 PM Dedra B wrote: Reason for CRM: Pt wants to know how long she should stay on 2 mg of Ozempic  before switching to one of the other injections that she discussed with her provider. She still has an increased appetite and had gained more weight.

## 2024-05-26 ENCOUNTER — Telehealth: Payer: Self-pay | Admitting: *Deleted

## 2024-05-26 NOTE — Telephone Encounter (Signed)
 Copied from CRM [id number]. Topic: Clinical - Medication Question >> May 22, 2024 12:01 PM Dedra B wrote: Reason for CRM: Pt wants to know how long she should stay on 2 mg of Ozempic  before switching to one of the other injections that she discussed with her provider. She still has an increased appetite and had gained more weight. >> May 26, 2024  9:01 AM Montie POUR wrote: She is calling back because she has not had a call back about her Ozempic . Her understanding is that this medication is going to be changed to something different. Please call her back at [phone]. Thanks

## 2024-05-26 NOTE — Telephone Encounter (Signed)
Spoke with patient and an appointment scheduled 

## 2024-05-27 ENCOUNTER — Ambulatory Visit (HOSPITAL_COMMUNITY): Admitting: Licensed Clinical Social Worker

## 2024-05-28 LAB — GLUCOSE, CSF: Glucose, CSF: 60 mg/dL (ref 40–80)

## 2024-05-28 LAB — FUNGUS CULTURE W SMEAR
CULTURE:: NO GROWTH
MICRO NUMBER:: 16825431
SMEAR:: NONE SEEN
SPECIMEN QUALITY:: ADEQUATE

## 2024-05-28 LAB — GRAM STAIN
MICRO NUMBER:: 16825430
SPECIMEN QUALITY:: ADEQUATE

## 2024-05-28 LAB — CSF CELL COUNT WITH DIFFERENTIAL
RBC Count, CSF: 10 {cells}/uL — ABNORMAL HIGH
TOTAL NUCLEATED CELL: 0 {cells}/uL (ref 0–5)

## 2024-05-28 LAB — PROTEIN, CSF: Total Protein, CSF: 32 mg/dL (ref 15–45)

## 2024-05-28 LAB — VDRL, CSF: VDRL Quant, CSF: NONREACTIVE

## 2024-06-05 ENCOUNTER — Ambulatory Visit: Payer: Self-pay

## 2024-06-05 NOTE — Telephone Encounter (Signed)
 FYI Only or Action Required?: FYI only for provider.  Patient was last seen in primary care on 04/08/2024 by Theophilus Andrews, Tully GRADE, MD.  Called Nurse Triage reporting URI.  Symptoms began several days ago.  Interventions attempted: OTC medications: dayquil.  Symptoms are: unchanged.  Triage Disposition: See Physician Within 24 Hours  Patient/caregiver understands and will follow disposition?: Yes, will follow disposition  Message from [facility] sent at 06/05/2024  1:54 PM EDT  Pt calling due to sore throat, coughing, and body aches.   Reason for Disposition  SEVERE throat pain (e.g., excruciating)  Answer Assessment - Initial Assessment Questions 1. ONSET: When did the throat start hurting? (Hours or days ago)      Few days 2. SEVERITY: How bad is the sore throat? (Scale 1-10; mild, moderate or severe)     severe 3. STREP EXPOSURE: Has there been any exposure to strep within the past week? If Yes, ask: What type of contact occurred?      Denies, did telehealth visit and was told by the provider that it was not strep after looking in throat 4.  VIRAL SYMPTOMS: Are there any symptoms of a cold, such as a runny nose, cough, hoarse voice or red eyes?      Denies runny nose, has non productive cough 5. FEVER: Do you have a fever? If Yes, ask: What is your temperature, how was it measured, and when did it start?     100.4 yesterday 6. PUS ON THE TONSILS: Is there pus on the tonsils in the back of your throat?     denies 7. OTHER SYMPTOMS: Do you have any other symptoms? (e.g., difficulty breathing, headache, rash)     Cough, body aches 8. PREGNANCY: Is there any chance you are pregnant? When was your last menstrual period?     Denies  Pt was seen by telehealth and prescribed Augmentin. Pt requests appt.  Protocols used: Sore Throat-A-AH

## 2024-06-05 NOTE — Telephone Encounter (Signed)
 Noted

## 2024-06-06 ENCOUNTER — Ambulatory Visit: Admitting: Internal Medicine

## 2024-06-06 ENCOUNTER — Encounter: Payer: Self-pay | Admitting: Internal Medicine

## 2024-06-06 VITALS — BP 126/82 | HR 80 | Temp 98.7°F | Ht 61.0 in | Wt 181.0 lb

## 2024-06-06 DIAGNOSIS — J069 Acute upper respiratory infection, unspecified: Secondary | ICD-10-CM | POA: Diagnosis not present

## 2024-06-06 LAB — POC INFLUENZA A&B (BINAX/QUICKVUE)
Influenza A, POC: NEGATIVE
Influenza B, POC: NEGATIVE

## 2024-06-06 LAB — POC COVID19 BINAXNOW: SARS Coronavirus 2 Ag: NEGATIVE

## 2024-06-06 MED ORDER — PROMETHAZINE-DM 6.25-15 MG/5ML PO SYRP: 5.0000 mL | ORAL_SOLUTION | Freq: Four times a day (QID) | ORAL | 0 refills | Status: AC | PRN

## 2024-06-06 NOTE — Patient Instructions (Addendum)
      Medications changes include :   claritin, flonase, start the meloxicam  daily, do salt water gargles, cough syrup for night sent to the pharmacy     Return if symptoms worsen or fail to improve.

## 2024-06-06 NOTE — Progress Notes (Signed)
 Subjective:    Patient ID: Tracey Beard, female    DOB: [birth year], 43 y.o.   MRN: [id number]      HPI Tracey Beard is here for  Chief Complaint  Patient presents with   Cough    Cough, low-grade fever, sore throat    Discussed the use of AI scribe software for clinical note transcription with the patient, who gave verbal consent to proceed.  History of Present Illness Tracey Beard is a 43 year old female who presents with a persistent sore throat and associated symptoms.  Her symptoms began about a week ago with a mild and intermittent sore throat, which worsened by the third day to become constant and persistent throughout the day and night. The sore throat is described as 'scratchy' and 'irritating', and it has not improved despite using home remedies such as tea with honey and lemon, and DayQuil.  In addition to the sore throat, she has experienced fatigue, body aches, and a fever that peaked at 100.67F but resolved spontaneously. She developed a dry cough three days ago, which has since become productive with phlegm as of this morning. The cough has been disruptive to her sleep, keeping her awake at night.  No nasal congestion, but she notes that the throat soreness feels like it could be due to drainage. Occasional pressure in her ears and sinus area was transient. She has a history of migraines and has had some headaches during this illness. She experienced dizziness yesterday when she got up to move around, which she attributes to being in bed for extended periods.  Her appetite has been poor, partly due to the pain associated with swallowing. She has been trying to maintain hydration with tea and water. She reports some nausea but no diarrhea. Her stomach has been 'off', which she associates with DayQuil use.  She has not taken any ibuprofen  or Advil , opting to stick with DayQuil, which she believes contains Tylenol . She has not used any nasal sprays or allergy medications.         Medications and allergies reviewed with patient and updated if appropriate.  Current Outpatient Medications on File Prior to Visit  Medication Sig Dispense Refill   amLODipine  (NORVASC ) 5 MG tablet TAKE ONE TABLET (5mg  total) BY MOUTH EVERY DAY 90 tablet 1   atorvastatin  (LIPITOR) 20 MG tablet TAKE ONE TABLET BY MOUTH EVERY DAY 90 tablet 1   buPROPion  (WELLBUTRIN  XL) 150 MG 24 hr tablet 1 qam  for 1 week then 2 qam 180 tablet 2   busPIRone  (BUSPAR ) 5 MG tablet 1 TID 270 tablet 1   Continuous Glucose Receiver (FREESTYLE LIBRE 3 READER) DEVI 1 each by Does not apply route See admin instructions. 2 each 3   Continuous Glucose Sensor (FREESTYLE LIBRE 3 SENSOR) MISC 1 each by Does not apply route See admin instructions. Place 1 sensor on the skin every 14 days. Use to check glucose continuously 2 each 3   esomeprazole  (NEXIUM ) 40 MG capsule Take 1 capsule (40 mg total) by mouth 2 (two) times daily before a meal. 60 capsule 5   Fremanezumab -vfrm (AJOVY ) 225 MG/1.5ML SOAJ Inject 225 mg into the skin every 30 (thirty) days. 1.5 mL 5   ondansetron  (ZOFRAN ) 4 MG tablet Take 1 tablet (4 mg total) by mouth every 6 (six) hours. 12 tablet 0   ondansetron  (ZOFRAN -ODT) 4 MG disintegrating tablet Take 1 tablet (4 mg total) by mouth every 8 (eight) hours as needed for nausea or vomiting.  20 tablet 0   propranolol  ER (INDERAL  LA) 80 MG 24 hr capsule Take 1 capsule (80 mg total) by mouth at bedtime. 90 capsule 1   Semaglutide , 2 MG/DOSE, 8 MG/3ML SOPN Inject 2 mg as directed once a week. 3 mL 2   temazepam  (RESTORIL ) 15 MG capsule 2 qhs 15 capsule 4   topiramate  (TOPAMAX ) 25 MG tablet start 1 tab at bedtime for 1 week, increase to 1 tablet twice a day (1 in the morning and 1 in the evening) for 1 week, then increase to 1 tablet in the morning and 2 tablet in the evening for 1 week, then increase to 50 mg twice a day. 70 tablet 0   triamterene -hydrochlorothiazide  (MAXZIDE ) 75-50 MG tablet Take 1 tablet by  mouth daily. 90 tablet 0   No current facility-administered medications on file prior to visit.    Review of Systems  Constitutional:  Positive for fatigue and fever (100.4).  HENT:  Positive for postnasal drip and sore throat (constant x 1 week). Negative for congestion, ear pain and sinus pain.   Respiratory:  Positive for cough (dry - this morning started to produce some phlegm). Negative for chest tightness, shortness of breath and wheezing.   Gastrointestinal:  Positive for nausea. Negative for diarrhea.  Musculoskeletal:  Positive for myalgias.  Neurological:  Positive for dizziness (yesterday) and headaches. Negative for light-headedness.       Objective:   Vitals:   06/06/24 0753  BP: 126/82  Pulse: 80  Temp: 98.7 F (37.1 C)  SpO2: 99%   BP Readings from Last 3 Encounters:  06/06/24 126/82  04/30/24 100/64  04/08/24 110/80   Wt Readings from Last 3 Encounters:  06/06/24 181 lb (82.1 kg)  04/08/24 178 lb (80.7 kg)  04/03/24 178 lb 14.4 oz (81.1 kg)   Body mass index is 34.2 kg/m.    Physical Exam Constitutional:      General: She is not in acute distress.    Appearance: Normal appearance. She is not ill-appearing.  HENT:     Head: Normocephalic and atraumatic.     Right Ear: Tympanic membrane, ear canal and external ear normal.     Left Ear: Tympanic membrane, ear canal and external ear normal.     Mouth/Throat:     Mouth: Mucous membranes are moist.     Pharynx: No oropharyngeal exudate or posterior oropharyngeal erythema.  Eyes:     Conjunctiva/sclera: Conjunctivae normal.  Cardiovascular:     Rate and Rhythm: Normal rate and regular rhythm.  Pulmonary:     Effort: Pulmonary effort is normal. No respiratory distress.     Breath sounds: Normal breath sounds. No wheezing or rales.  Musculoskeletal:     Cervical back: Neck supple. No tenderness.  Lymphadenopathy:     Cervical: No cervical adenopathy.  Skin:    General: Skin is warm and dry.   Neurological:     Mental Status: She is alert.            Assessment & Plan:   Assessment and Plan Assessment & Plan Viral upper respiratory infection Symptoms consistent with viral etiology, negative COVID-19 and influenza tests.  Persistent sore throat and dry cough with recent phlegm development all consistent with viral infection  -Discussed no need for antibiotic - Start taking meloxicam  once daily for inflammation and throat pain, which she has at home - Recommend saltwater gargles for throat relief. - Prescribe promethazine -DM syrup for nighttime use. - Advise discontinuation of DayQuil,  since it is not helping -Can take Tylenol  if needed on top of the meloxicam  - Discuss use of Claritin or Flonase for postnasal drainage - Advise on hydration and rest. - Advised to call if any questions or if her symptoms are not improving.  Reassured her symptoms should improve over the next several days

## 2024-06-10 ENCOUNTER — Ambulatory Visit (HOSPITAL_COMMUNITY): Admitting: Licensed Clinical Social Worker

## 2024-06-11 ENCOUNTER — Encounter (HOSPITAL_COMMUNITY): Payer: Self-pay | Admitting: Licensed Clinical Social Worker

## 2024-06-11 ENCOUNTER — Ambulatory Visit (INDEPENDENT_AMBULATORY_CARE_PROVIDER_SITE_OTHER): Admitting: Licensed Clinical Social Worker

## 2024-06-11 DIAGNOSIS — F330 Major depressive disorder, recurrent, mild: Secondary | ICD-10-CM | POA: Diagnosis not present
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
## 2024-06-16 ENCOUNTER — Other Ambulatory Visit: Payer: Self-pay | Admitting: Neurology

## 2024-06-16 ENCOUNTER — Other Ambulatory Visit: Payer: Self-pay | Admitting: Internal Medicine

## 2024-06-16 DIAGNOSIS — E1169 Type 2 diabetes mellitus with other specified complication: Secondary | ICD-10-CM

## 2024-06-17 ENCOUNTER — Ambulatory Visit (INDEPENDENT_AMBULATORY_CARE_PROVIDER_SITE_OTHER): Admitting: Licensed Clinical Social Worker

## 2024-06-17 DIAGNOSIS — F330 Major depressive disorder, recurrent, mild: Secondary | ICD-10-CM | POA: Diagnosis not present

## 2024-06-17 DIAGNOSIS — F4310 Post-traumatic stress disorder, unspecified: Secondary | ICD-10-CM | POA: Diagnosis not present

## 2024-06-18 ENCOUNTER — Encounter: Payer: Self-pay | Admitting: Neurology

## 2024-06-18 ENCOUNTER — Encounter (HOSPITAL_COMMUNITY): Payer: Self-pay | Admitting: Licensed Clinical Social Worker

## 2024-06-18 NOTE — Progress Notes (Signed)
   THERAPIST PROGRESS NOTE  Session Time: 12:30pm-1:25pm  Participation Level: Active  Behavioral Response: Well GroomedAlertAnxious  Type of Therapy: Individual Therapy  Treatment Goals addressed:  TG: Reduce frequency, intensity, and duration of depression symptoms so that daily functioning is improved (OP Depression)  Disciplines:  Interdisciplinary, PROVIDER    Expected end:  03/17/24          LTG: Increase coping skills to manage depression and improve ability to perform daily activities (OP Depression)  Disciplines:  Interdisciplinary, PROVIDER    Expected end:  03/17/24       ProgressTowards Goals: Progressing  Interventions: CBT and Solution Focused  Summary: Tracey Beard is a 43 y.o. female who presents with MDD, recurrent, mild, PTSD.   Suicidal/Homicidal: Nowithout intent/plan  Therapist Response: Autumnrose engaged well in individual in person session with clinician. Clinician utilized CBT to process thoughts, feelings, and behaviors. Clinician explored updates in recent rage episodes. [name] shared 3-4 incidents over the last 4-8 weeks where she has become extremely aggressive, angry, and motivated to physically fight someone. Clinician reflected the fear and worry about this, as Buford has never been a violent person. Clinician reviewed medications and encouraged Jaelynn to communicate with dr about the newer medications that have been added. Clinician researched some drug interactions, but was unable to find any scientific evidence that new meds would have interacted with current meds to make her more aggressive. However, clinician did note that increased anxiety, activating her fight or flight, can make the urges to fight more present and exciting. [name] shared that she will contact her doctor about certain medications that have recently been prescribed.   Plan: Return again in 1-2 weeks.  Diagnosis: MDD (major depressive disorder), recurrent episode,  mild  PTSD (post-traumatic stress disorder)  Collaboration of Care: Medication Management AEB [name] will communicate with her providers about new medication that has been prescribed for treatment of migraines.   Patient/Guardian was advised Release of Information must be obtained prior to any record release in order to collaborate their care with an outside provider. Patient/Guardian was advised if they have not already done so to contact the registration department to sign all necessary forms in order for us  to release information regarding their care.   Consent: Patient/Guardian gives verbal consent for treatment and assignment of benefits for services provided during this visit. Patient/Guardian expressed understanding and agreed to proceed.   [name] [place], LCSW 06/18/2024

## 2024-06-19 NOTE — Telephone Encounter (Signed)
 Last filled by patient on 05/20/24 Last office visit : 02/05/24 Next office visit : Not sheduled  per 02/05/24 note -  I would not use Topiramate  for that reason( word finding difficulties ) and the possible dyseasthesias, but like to add propanolol ER at night po.   Please advise if patient is following the dosage from last refill

## 2024-06-25 ENCOUNTER — Ambulatory Visit (INDEPENDENT_AMBULATORY_CARE_PROVIDER_SITE_OTHER): Admitting: Licensed Clinical Social Worker

## 2024-06-25 DIAGNOSIS — F331 Major depressive disorder, recurrent, moderate: Secondary | ICD-10-CM | POA: Diagnosis not present

## 2024-06-25 DIAGNOSIS — F4310 Post-traumatic stress disorder, unspecified: Secondary | ICD-10-CM

## 2024-06-25 MED ORDER — TOPIRAMATE 50 MG PO TABS: 50.0000 mg | ORAL_TABLET | Freq: Two times a day (BID) | ORAL | 5 refills | Status: AC

## 2024-06-26 ENCOUNTER — Encounter (HOSPITAL_COMMUNITY): Payer: Self-pay | Admitting: Licensed Clinical Social Worker

## 2024-06-26 ENCOUNTER — Ambulatory Visit (HOSPITAL_COMMUNITY): Admitting: Licensed Clinical Social Worker

## 2024-06-26 NOTE — Progress Notes (Signed)
 Virtual Visit via Video Note  I connected with Tomesha Sargent on 06/25/24 at 10:00 AM EDT by a video enabled telemedicine application and verified that I am speaking with the correct person using two identifiers.  Location: Patient: home Provider: home office   I discussed the limitations of evaluation and management by telemedicine and the availability of in person appointments. The patient expressed understanding and agreed to proceed.   I discussed the assessment and treatment plan with the patient. The patient was provided an opportunity to ask questions and all were answered. The patient agreed with the plan and demonstrated an understanding of the instructions.   The patient was advised to call back or seek an in-person evaluation if the symptoms worsen or if the condition fails to improve as anticipated.  I provided 55 minutes of non-face-to-face time during this encounter.   [name], LCSW   THERAPIST PROGRESS NOTE  Session Time: 10:00am-10:55am  Participation Level: Active  Behavioral Response: CasualAlertAnxious and Depressed  Type of Therapy: Individual Therapy  Treatment Goals addressed:  TG: Reduce frequency, intensity, and duration of depression symptoms so that daily functioning is improved (OP Depression)  Disciplines:  Interdisciplinary, PROVIDER    Expected end:  03/17/24          LTG: Increase coping skills to manage depression and improve ability to perform daily activities (OP Depression)  Disciplines:  Interdisciplinary, PROVIDER    Expected end:  03/17/24       ProgressTowards Goals: Progressing  Interventions: CBT  Summary: Janeen Watson is a 43 y.o. female who presents with PTSD and MDD, recurrent, moderate.   Suicidal/Homicidal: Nowithout intent/plan  Therapist Response: Alleigh engaged well in individual virtual session with Facilities manager. Clinician utilized CBT to process recent trauma triggers that have left her quite shaken.  Clinician provided time and space to share her experience. Clinician processed thoughts and feelings, as well as the recent trigger and change in her ease and comfort of her home town. Clinician identified ways to change her thoughts about her own power to protect and care for herself.  Layli shared she has had a few significant incidents of rage and acting out behaviors over the past few weeks. She shared that she is now off the Propanolol which may have been triggering this. However, she shared that she still hold a lot of internal anger that is very easily triggered. Clinician reminded Ardene of deep breathing, walking away, and physical exercise as a way to burn off that energy.  Plan: Return again in 1 week.  Diagnosis: PTSD (post-traumatic stress disorder)  MDD (major depressive disorder), recurrent episode, moderate (HCC)  Collaboration of Care: Psychiatrist AEB will update Dr. Tasia about new sxs of rage  Patient/Guardian was advised Release of Information must be obtained prior to any record release in order to collaborate their care with an outside provider. Patient/Guardian was advised if they have not already done so to contact the registration department to sign all necessary forms in order for us  to release information regarding their care.   Consent: Patient/Guardian gives verbal consent for treatment and assignment of benefits for services provided during this visit. Patient/Guardian expressed understanding and agreed to proceed.   [name] JONELLE [place], LCSW 06/26/2024

## 2024-07-01 ENCOUNTER — Ambulatory Visit (INDEPENDENT_AMBULATORY_CARE_PROVIDER_SITE_OTHER): Admitting: Licensed Clinical Social Worker

## 2024-07-01 DIAGNOSIS — F4310 Post-traumatic stress disorder, unspecified: Secondary | ICD-10-CM | POA: Diagnosis not present

## 2024-07-02 ENCOUNTER — Ambulatory Visit (HOSPITAL_COMMUNITY): Admitting: Licensed Clinical Social Worker

## 2024-07-02 ENCOUNTER — Encounter: Payer: Self-pay | Admitting: Internal Medicine

## 2024-07-02 ENCOUNTER — Ambulatory Visit: Admitting: Internal Medicine

## 2024-07-02 VITALS — BP 114/80 | HR 98 | Temp 99.3°F | Ht 61.0 in | Wt 179.1 lb

## 2024-07-02 DIAGNOSIS — J069 Acute upper respiratory infection, unspecified: Secondary | ICD-10-CM | POA: Diagnosis not present

## 2024-07-02 NOTE — Progress Notes (Signed)
 Established Patient Office Visit     CC/Reason for Visit: URI symptoms  HPI: Tracey Beard is a 43 y.o. female who is coming in today for the above mentioned reasons.  Fatigue, sore throat, postnasal drip, congestion.  She had the symptoms about a month ago.  Had a period of improvement and then is having same symptoms again for the past 2 weeks.   Past Medical/Surgical History: Past Medical History:  Diagnosis Date   Allergy    Anxiety    Arthritis    Chronic back pain    Depression    GERD (gastroesophageal reflux disease)    Gestational diabetes    glyburide    Headache    Heart murmur    Hypertension    Idiopathic intracranial hypertension    Leukocytosis 02/25/2015   Obesity    Pregnancy induced hypertension    labetalol     Past Surgical History:  Procedure Laterality Date   CESAREAN SECTION N/A 01/29/2019   Procedure: CESAREAN SECTION;  Surgeon: Dannielle Bouchard, DO;  Location: [facility];  Service: Obstetrics;  Laterality: N/A;   CHOLECYSTECTOMY     LUMBAR PUNCTURE  2014   5x    Social History:  reports that she has never smoked. She has never used smokeless tobacco. She reports that she does not currently use alcohol. She reports that she does not use drugs.  Allergies: Allergies  Allergen Reactions   Elavil [Amitriptyline Hcl] Shortness Of Breath   Restoril  [Temazepam ] Shortness Of Breath   Trazodone And Nefazodone Anaphylaxis   Diamox [Acetazolamide] Other (See Comments)    Cold chills/loss of taste    Tomato Swelling and Other (See Comments)    Causes lips to swell   Norco [Hydrocodone -Acetaminophen ] Itching   Pollen Extract Other (See Comments)    Unknown reaction    Topamax  [Topiramate ] Other (See Comments)    Chest tightness   Benzocaine Swelling, Rash and Other (See Comments)    Localized swelling  04/30/24: Pt tolerated lidocaine  well   Latex Rash   Novocain [Procaine] Swelling, Rash and Other (See Comments)    Localized swelling     Family History:  Family History  Problem Relation Age of Onset   Hypertension Mother    Breast cancer Mother    Pancreatic cancer Mother    Diabetes Mother    Cancer Mother    Hypertension Father    Hypertension Maternal Grandmother    Heart disease Maternal Grandmother    Arthritis Maternal Grandmother    Hypertension Maternal Grandfather    Diabetes Maternal Grandfather    Colon cancer Paternal Grandmother    Heart disease Paternal Grandfather    Hypertension Maternal Aunt    Breast cancer Maternal Aunt    Obesity Maternal Aunt    Diabetes Maternal Aunt    Hypertension Maternal Uncle    Liver cancer Maternal Uncle    Alcohol abuse Maternal Uncle    Prostate cancer Maternal Uncle    Miscarriages / Stillbirths Daughter    Asthma Brother    Cancer Maternal Aunt      Current Outpatient Medications:    amLODipine  (NORVASC ) 5 MG tablet, TAKE ONE TABLET (5mg  total) BY MOUTH EVERY DAY, Disp: 90 tablet, Rfl: 1   atorvastatin  (LIPITOR) 20 MG tablet, TAKE ONE TABLET BY MOUTH EVERY DAY, Disp: 90 tablet, Rfl: 1   buPROPion  (WELLBUTRIN  XL) 150 MG 24 hr tablet, 1 qam  for 1 week then 2 qam, Disp: 180 tablet, Rfl: 2  busPIRone  (BUSPAR ) 5 MG tablet, 1 TID, Disp: 270 tablet, Rfl: 1   Continuous Glucose Receiver (FREESTYLE LIBRE 3 READER) DEVI, 1 each by Does not apply route See admin instructions., Disp: 2 each, Rfl: 3   Continuous Glucose Sensor (FREESTYLE LIBRE 3 SENSOR) MISC, 1 each by Does not apply route See admin instructions. Place 1 sensor on the skin every 14 days. Use to check glucose continuously, Disp: 2 each, Rfl: 3   esomeprazole  (NEXIUM ) 40 MG capsule, Take 1 capsule (40 mg total) by mouth 2 (two) times daily before a meal., Disp: 60 capsule, Rfl: 5   Fremanezumab -vfrm (AJOVY ) 225 MG/1.5ML SOAJ, Inject 225 mg into the skin every 30 (thirty) days., Disp: 1.5 mL, Rfl: 5   ondansetron  (ZOFRAN ) 4 MG tablet, Take 1 tablet (4 mg total) by mouth every 6 (six) hours., Disp: 12  tablet, Rfl: 0   ondansetron  (ZOFRAN -ODT) 4 MG disintegrating tablet, Take 1 tablet (4 mg total) by mouth every 8 (eight) hours as needed for nausea or vomiting., Disp: 20 tablet, Rfl: 0   OZEMPIC , 2 MG/DOSE, 8 MG/3ML SOPN, Inject 2 mg as directed once a week., Disp: 3 mL, Rfl: 2   promethazine -dextromethorphan (PROMETHAZINE -DM) 6.25-15 MG/5ML syrup, Take 5 mLs by mouth 4 (four) times daily as needed., Disp: 150 mL, Rfl: 0   temazepam  (RESTORIL ) 15 MG capsule, 2 qhs, Disp: 15 capsule, Rfl: 4   topiramate  (TOPAMAX ) 50 MG tablet, Take 1 tablet (50 mg total) by mouth 2 (two) times daily., Disp: 60 tablet, Rfl: 5   triamterene -hydrochlorothiazide  (MAXZIDE ) 75-50 MG tablet, Take 1 tablet by mouth daily., Disp: 90 tablet, Rfl: 0   propranolol  ER (INDERAL  LA) 80 MG 24 hr capsule, Take 1 capsule (80 mg total) by mouth at bedtime., Disp: 90 capsule, Rfl: 1  Review of Systems:  Negative unless indicated in HPI.   Physical Exam: Vitals:   07/02/24 0851  BP: 114/80  Pulse: 98  Temp: 99.3 F (37.4 C)  TempSrc: Oral  SpO2: 99%  Weight: 179 lb 1.6 oz (81.2 kg)  Height: 5' 1 (1.549 m)    Body mass index is 33.84 kg/m.   Physical Exam Vitals reviewed.  Constitutional:      Appearance: Normal appearance.  HENT:     Right Ear: Tympanic membrane, ear canal and external ear normal.     Left Ear: Tympanic membrane, ear canal and external ear normal.     Mouth/Throat:     Mouth: Mucous membranes are moist.     Pharynx: Posterior oropharyngeal erythema present.  Eyes:     Conjunctiva/sclera: Conjunctivae normal.  Cardiovascular:     Rate and Rhythm: Normal rate and regular rhythm.  Pulmonary:     Effort: Pulmonary effort is normal.     Breath sounds: Normal breath sounds.  Neurological:     Mental Status: She is alert.      Impression and Plan:  Viral URI   -Given exam findings, PNA, pharyngitis, ear infection are not likely, hence abx have not been prescribed. -Have advised rest,  fluids, OTC antihistamines, cough suppressants and mucinex. -RTC if no improvement in 10-14 days.   Time spent:22 minutes reviewing chart, interviewing and examining patient and formulating plan of care.     Tully Theophilus Andrews, MD  Primary Care at [facility]

## 2024-07-07 ENCOUNTER — Encounter (HOSPITAL_COMMUNITY): Payer: Self-pay | Admitting: Licensed Clinical Social Worker

## 2024-07-07 NOTE — Progress Notes (Signed)
 Virtual Visit via Video Note  I connected with Tracey Beard on 07/07/24 at 12:30 PM EDT by a video enabled telemedicine application and verified that I am speaking with the correct person using two identifiers.  Location: Patient: home Provider: office   I discussed the limitations of evaluation and management by telemedicine and the availability of in person appointments. The patient expressed understanding and agreed to proceed.    I discussed the assessment and treatment plan with the patient. The patient was provided an opportunity to ask questions and all were answered. The patient agreed with the plan and demonstrated an understanding of the instructions.   The patient was advised to call back or seek an in-person evaluation if the symptoms worsen or if the condition fails to improve as anticipated.  I provided 45 minutes of non-face-to-face time during this encounter.   [name], LCSW   THERAPIST PROGRESS NOTE  Session Time: 12:30pm-1:15pm  Participation Level: Active  Behavioral Response: CasualAlertDepressed  Type of Therapy: Individual Therapy  Treatment Goals addressed:  TG: Reduce frequency, intensity, and duration of depression symptoms so that daily functioning is improved (OP Depression)  Disciplines:  Interdisciplinary, PROVIDER    Expected end:  03/17/24          LTG: Increase coping skills to manage depression and improve ability to perform daily activities (OP Depression)  Disciplines:  Interdisciplinary, PROVIDER    Expected end:  03/17/24         ProgressTowards Goals: Progressing  Interventions: Motivational Interviewing  Summary: Tracey Beard is a 43 y.o. female who presents with PTSD.   Suicidal/Homicidal: Nowithout intent/plan  Therapist Response: Truc engaged well in individual virtual session with Facilities manager. Clinician utilized MI OARS to reflect and summarize thoughts, feelings, and interactions. Clinician explored updates  on rage and anger issues noted in previous sessions. Clinician processed changes in medications and noted some improvement. However, anxiety has increased and PTSD sxs have been flaring up. Clinician explored triggers and noted past trauma reminders with family have been resurfacing. Astraea shared information about traumatic experience with dad's wife and their family when Deavion was a child. Clinician provided a safe environment for active listening and sharing.   Plan: Return again in 1-2 weeks.  Diagnosis: PTSD (post-traumatic stress disorder)  Collaboration of Care: Other reviewed chart notes  Patient/Guardian was advised Release of Information must be obtained prior to any record release in order to collaborate their care with an outside provider. Patient/Guardian was advised if they have not already done so to contact the registration department to sign all necessary forms in order for us  to release information regarding their care.   Consent: Patient/Guardian gives verbal consent for treatment and assignment of benefits for services provided during this visit. Patient/Guardian expressed understanding and agreed to proceed.   [name] JONELLE [place], LCSW 07/07/2024

## 2024-07-08 ENCOUNTER — Ambulatory Visit (HOSPITAL_COMMUNITY): Admitting: Psychiatry

## 2024-07-08 ENCOUNTER — Encounter (HOSPITAL_COMMUNITY): Payer: Self-pay | Admitting: Psychiatry

## 2024-07-08 ENCOUNTER — Other Ambulatory Visit: Payer: Self-pay

## 2024-07-08 ENCOUNTER — Ambulatory Visit (HOSPITAL_COMMUNITY): Admitting: Licensed Clinical Social Worker

## 2024-07-08 VITALS — BP 126/90 | HR 103 | Ht 61.0 in | Wt 170.0 lb

## 2024-07-08 DIAGNOSIS — F324 Major depressive disorder, single episode, in partial remission: Secondary | ICD-10-CM

## 2024-07-08 MED ORDER — PERPHENAZINE 2 MG PO TABS: ORAL_TABLET | ORAL | 2 refills | Status: DC

## 2024-07-08 MED ORDER — BUSPIRONE HCL 5 MG PO TABS: ORAL_TABLET | ORAL | 1 refills | Status: DC

## 2024-07-08 MED ORDER — TEMAZEPAM 15 MG PO CAPS: ORAL_CAPSULE | ORAL | 4 refills | Status: AC

## 2024-07-08 MED ORDER — BUPROPION HCL ER (XL) 150 MG PO TB24: ORAL_TABLET | ORAL | 2 refills | Status: DC

## 2024-07-08 NOTE — Progress Notes (Signed)
 Psychiatric Initial Adult Assessment   Patient Identification: Tracey Beard MRN:  [id number] Date of Evaluation:  07/08/2024 Referral Source: Dr. Dedra Dohmeier Chief Complaint: Depression Visit Diagnosis:   History of Present Illness:      Today patient seems to be reasonably well.  Unfortunately her court trial has not finished.  Apparently in November she will finally have her last day in court.  The patient also has been experiencing a number of episodes of rage.  1 was appropriate for road rage although it was extreme.  The patient had also a period of rage when she was angry at some woman who was in the audience at a football game.  Patient also had some rage and somebody who she was knocking on her door and she was not answering.  The patient says her rage was extreme and it clearly sounded almost dangerous.  Today we are going to offer her a small dose of Trilafon to take on a as needed basis when she feels rage.  Yes it is true the rage comes on quickly but I think there are circumstances where she could detect the rage take a Trilafon and detach herself.  She only received 10 pills.  Otherwise she is taking good care of her son.  She wants to get back into the workforce.  She will continue with one-to-one therapy. Associated Signs/Symptoms: Depression Symptoms:   (Hypo) Manic Symptoms:   Anxiety Symptoms:   Psychotic Symptoms:   PTSD Symptoms: NA  Past Psychiatric History:   Previous Psychotropic Medications: Yes   Substance Abuse History in the last 12 months:  Yes.    Consequences of Substance Abuse:   Past Medical History:  Past Medical History:  Diagnosis Date   Allergy    Anxiety    Arthritis    Chronic back pain    Depression    GERD (gastroesophageal reflux disease)    Gestational diabetes    glyburide    Headache    Heart murmur    Hypertension    Idiopathic intracranial hypertension    Leukocytosis 02/25/2015   Obesity    Pregnancy induced  hypertension    labetalol     Past Surgical History:  Procedure Laterality Date   CESAREAN SECTION N/A 01/29/2019   Procedure: CESAREAN SECTION;  Surgeon: Dannielle Bouchard, DO;  Location: [facility];  Service: Obstetrics;  Laterality: N/A;   CHOLECYSTECTOMY     LUMBAR PUNCTURE  2014   5x    Family Psychiatric History:   Family History:  Family History  Problem Relation Age of Onset   Hypertension Mother    Breast cancer Mother    Pancreatic cancer Mother    Diabetes Mother    Cancer Mother    Hypertension Father    Hypertension Maternal Grandmother    Heart disease Maternal Grandmother    Arthritis Maternal Grandmother    Hypertension Maternal Grandfather    Diabetes Maternal Grandfather    Colon cancer Paternal Grandmother    Heart disease Paternal Grandfather    Hypertension Maternal Aunt    Breast cancer Maternal Aunt    Obesity Maternal Aunt    Diabetes Maternal Aunt    Hypertension Maternal Uncle    Liver cancer Maternal Uncle    Alcohol abuse Maternal Uncle    Prostate cancer Maternal Uncle    Miscarriages / Stillbirths Daughter    Asthma Brother    Cancer Maternal Aunt     Social History:   Social History   Socioeconomic  History   Marital status: Married    Spouse name: Not on file   Number of children: 2   Years of education: Not on file   Highest education level: Associate degree: occupational, Scientist, product/process development, or vocational program  Occupational History   Occupation: Nurse  Tobacco Use   Smoking status: Never   Smokeless tobacco: Never  Vaping Use   Vaping status: Never Used  Substance and Sexual Activity   Alcohol use: Not Currently    Comment: socially   Drug use: No   Sexual activity: Not Currently    Birth control/protection: None  Other Topics Concern   Not on file  Social History Narrative   Not on file   Social Drivers of Health   Financial Resource Strain: Medium Risk (06/30/2024)   Overall Financial Resource Strain (CARDIA)     Difficulty of Paying Living Expenses: Somewhat hard  Food Insecurity: No Food Insecurity (06/30/2024)   Hunger Vital Sign    Worried About Running Out of Food in the Last Year: Never true    Ran Out of Food in the Last Year: Never true  Transportation Needs: No Transportation Needs (06/30/2024)   PRAPARE - Administrator, Civil Service (Medical): No    Lack of Transportation (Non-Medical): No  Physical Activity: Insufficiently Active (06/30/2024)   Exercise Vital Sign    Days of Exercise per Week: 2 days    Minutes of Exercise per Session: 30 min  Stress: No Stress Concern Present (06/30/2024)   [employer] of Occupational Health - Occupational Stress Questionnaire    Feeling of Stress: Only a little  Social Connections: Moderately Isolated (06/30/2024)   Social Connection and Isolation Panel    Frequency of Communication with Friends and Family: More than three times a week    Frequency of Social Gatherings with Friends and Family: Twice a week    Attends Religious Services: Never    Database administrator or Organizations: No    Attends Engineer, structural: Not on file    Marital Status: Married    Additional Social History:   Allergies:   Allergies  Allergen Reactions   Elavil [Amitriptyline Hcl] Shortness Of Breath   Restoril  [Temazepam ] Shortness Of Breath   Trazodone And Nefazodone Anaphylaxis   Diamox [Acetazolamide] Other (See Comments)    Cold chills/loss of taste    Tomato Swelling and Other (See Comments)    Causes lips to swell   Norco [Hydrocodone -Acetaminophen ] Itching   Pollen Extract Other (See Comments)    Unknown reaction    Topamax  [Topiramate ] Other (See Comments)    Chest tightness   Benzocaine Swelling, Rash and Other (See Comments)    Localized swelling  04/30/24: Pt tolerated lidocaine  well   Latex Rash   Novocain [Procaine] Swelling, Rash and Other (See Comments)    Localized swelling    Metabolic Disorder  Labs: Lab Results  Component Value Date   HGBA1C 5.5 02/14/2024   MPG 122.63 05/15/2022   No results found for: PROLACTIN Lab Results  Component Value Date   CHOL 141 04/08/2024   TRIG 94.0 04/08/2024   HDL 62.40 04/08/2024   CHOLHDL 2 04/08/2024   VLDL 18.8 04/08/2024   LDLCALC 60 04/08/2024   LDLCALC 95 02/14/2024   Lab Results  Component Value Date   TSH 1.74 02/14/2024    Therapeutic Level Labs: No results found for: LITHIUM No results found for: CBMZ No results found for: VALPROATE  Current Medications: Current Outpatient  Medications  Medication Sig Dispense Refill   amLODipine  (NORVASC ) 5 MG tablet TAKE ONE TABLET (5mg  total) BY MOUTH EVERY DAY 90 tablet 1   atorvastatin  (LIPITOR) 20 MG tablet TAKE ONE TABLET BY MOUTH EVERY DAY 90 tablet 1   Continuous Glucose Receiver (FREESTYLE LIBRE 3 READER) DEVI 1 each by Does not apply route See admin instructions. 2 each 3   Continuous Glucose Sensor (FREESTYLE LIBRE 3 SENSOR) MISC 1 each by Does not apply route See admin instructions. Place 1 sensor on the skin every 14 days. Use to check glucose continuously 2 each 3   esomeprazole  (NEXIUM ) 40 MG capsule Take 1 capsule (40 mg total) by mouth 2 (two) times daily before a meal. 60 capsule 5   Fremanezumab -vfrm (AJOVY ) 225 MG/1.5ML SOAJ Inject 225 mg into the skin every 30 (thirty) days. 1.5 mL 5   ondansetron  (ZOFRAN ) 4 MG tablet Take 1 tablet (4 mg total) by mouth every 6 (six) hours. 12 tablet 0   ondansetron  (ZOFRAN -ODT) 4 MG disintegrating tablet Take 1 tablet (4 mg total) by mouth every 8 (eight) hours as needed for nausea or vomiting. 20 tablet 0   OZEMPIC , 2 MG/DOSE, 8 MG/3ML SOPN Inject 2 mg as directed once a week. 3 mL 2   perphenazine (TRILAFON) 2 MG tablet Take one  when irritable 10 tablet 2   promethazine -dextromethorphan (PROMETHAZINE -DM) 6.25-15 MG/5ML syrup Take 5 mLs by mouth 4 (four) times daily as needed. 150 mL 0   topiramate  (TOPAMAX ) 50 MG tablet  Take 1 tablet (50 mg total) by mouth 2 (two) times daily. 60 tablet 5   triamterene -hydrochlorothiazide  (MAXZIDE ) 75-50 MG tablet Take 1 tablet by mouth daily. 90 tablet 0   buPROPion  (WELLBUTRIN  XL) 150 MG 24 hr tablet 1 qam  for 1 week then 2 qam 180 tablet 2   busPIRone  (BUSPAR ) 5 MG tablet 1 TID 270 tablet 1   propranolol  ER (INDERAL  LA) 80 MG 24 hr capsule Take 1 capsule (80 mg total) by mouth at bedtime. 90 capsule 1   temazepam  (RESTORIL ) 15 MG capsule 2 qhs 15 capsule 4   No current facility-administered medications for this visit.    Musculoskeletal: Strength & Muscle Tone: within normal limits Gait & Station: normal Patient leans: N/A  Psychiatric Specialty Exam: Review of Systems  Blood pressure (!) 126/90, pulse (!) 103, height 5' 1 (1.549 m), weight 170 lb (77.1 kg).Body mass index is 32.12 kg/m.  General Appearance: Casual  Eye Contact:  Good  Speech:  NA  Volume:  Normal  Mood:  Anxious  Affect:  Appropriate  Thought Process:  Coherent  Orientation:  Full (Time, Place, and Person)  Thought Content:  WDL  Suicidal Thoughts:  No  Homicidal Thoughts:  No  Memory:  NA  Judgement:  Good  Insight:  Good  Psychomotor Activity:  Normal  Concentration:    Recall:  NA  Fund of Knowledge:Good  Language: Good  Akathisia:  NA  Handed:  Left  AIMS (if indicated):  not done  Assets:  Desire for Improvement  ADL's:  Intact  Cognition: WNL  Sleep:  Poor   Screenings: GAD-7    Flowsheet Row Office Visit from 02/14/2024 in [facility] [place] [facility] [place] Office Visit from 11/21/2023 in [facility] [place] [facility] [place] Office Visit from 07/23/2023 in [facility] [place] [place] Office Visit from 04/19/2023 in [facility] [place] Office Visit from 02/21/2023 in [facility] [place]  Total GAD-7 Score 0 0 5 7 16    PHQ2-9    Flowsheet Row Office Visit from 02/14/2024 in [facility] [place] [place] Office Visit from 11/21/2023 in [facility] [place] [facility] [place] Office Visit from 07/23/2023 in [facility] [place] [facility] [place] Office Visit from 04/19/2023 in [facility] [place] [facility] [place] Office Visit from 02/21/2023 in [facility] [place]  PHQ-2 Total Score 0 0 0 2 6  PHQ-9 Total Score 1 0 0 6 18   Flowsheet Row ED from 03/04/2024 in [facility] Department at [facility] ED from 05/22/2022 in [facility] Emergency Department at [facility] ED to Hosp-Admission (Discharged) from 05/14/2022 in [place] LONG 4TH FLOOR PROGRESSIVE CARE AND UROLOGY  C-SSRS RISK CATEGORY No Risk No Risk No Risk    Assessment and Plan:     Today the patient will continue taking Wellbutrin  150 mg for major depression.  She takes Restoril  as needed for sleep.  She also takes BuSpar .  Today the patient will begin on Trilafon on a as needed basis when she is experiencing excessive rage.  She will continue to one-to-one therapy and return to see me in 2 months.  Collaboration of Care:   Patient/Guardian was advised Release of Information must be obtained prior to any record release in order to collaborate their care with an outside provider. Patient/Guardian was advised if they have not already done so to contact the registration department to sign all necessary forms in order for us  to release information regarding their care.   Consent: Patient/Guardian gives verbal consent for treatment and assignment of benefits for services provided during this visit. Patient/Guardian expressed understanding and agreed to proceed.   Elna LILLETTE Lo, MD 10/21/20253:04 PM

## 2024-07-10 ENCOUNTER — Ambulatory Visit (INDEPENDENT_AMBULATORY_CARE_PROVIDER_SITE_OTHER): Admitting: Licensed Clinical Social Worker

## 2024-07-10 DIAGNOSIS — F331 Major depressive disorder, recurrent, moderate: Secondary | ICD-10-CM | POA: Diagnosis not present

## 2024-07-10 DIAGNOSIS — F4310 Post-traumatic stress disorder, unspecified: Secondary | ICD-10-CM | POA: Diagnosis not present

## 2024-07-15 ENCOUNTER — Encounter (HOSPITAL_COMMUNITY): Payer: Self-pay | Admitting: Licensed Clinical Social Worker

## 2024-07-15 NOTE — Progress Notes (Signed)
 Virtual Visit via Video Note  I connected with Tracey Beard on 07/10/24 at  4:30 PM EDT by a video enabled telemedicine application and verified that I am speaking with the correct person using two identifiers.  Location: Patient: park Provider: home office   I discussed the limitations of evaluation and management by telemedicine and the availability of in person appointments. The patient expressed understanding and agreed to proceed.     I discussed the assessment and treatment plan with the patient. The patient was provided an opportunity to ask questions and all were answered. The patient agreed with the plan and demonstrated an understanding of the instructions.   The patient was advised to call back or seek an in-person evaluation if the symptoms worsen or if the condition fails to improve as anticipated.  I provided 45 minutes of non-face-to-face time during this encounter.   Tracey JONELLE Rosser, LCSW   THERAPIST PROGRESS NOTE  Session Time: 4:30pm-5:15pm  Participation Level: Active  Behavioral Response: Well GroomedAlertEuthymic  Type of Therapy: Individual Therapy  Treatment Goals addressed:  TG: Reduce frequency, intensity, and duration of depression symptoms so that daily functioning is improved (OP Depression)  Disciplines:  Interdisciplinary, PROVIDER    Expected end:  03/17/24          LTG: Increase coping skills to manage depression and improve ability to perform daily activities (OP Depression)  Disciplines:  Interdisciplinary, PROVIDER    Expected end:  03/17/24       ProgressTowards Goals: Progressing  Interventions: CBT  Summary: Tracey Beard is a 43 y.o. female who presents with PTSD and MDD, mild.   Suicidal/Homicidal: Nowithout intent/plan  Therapist Response: Tracey Beard engaged well in individual virtual session with facilities manager. Clinician processed thoughts, feelings, and interactions with CBT. Clinician explored trauma triggers this week and  sxs of anger. Clinician processed upset feelings about visit with Dr. Tasia. Clinician provided feedback and identified the importance of communicating with her doctor to ensure he gets it. Tracey Beard shared that her anger has been better since stopping the propanolol, which she believed was causing this feeling.  Clinician processed thoughts and feelings about her family, noting that her relationship with dad and step mom requires boundaries, emotional and physical. Clinician noted that the threat of a visit was too frightening and stressful for Tracey Beard to handle at that time. Clinician provided time and space for Tracey Beard to process past hx with step mom.   Plan: Return again in 2 weeks.  Diagnosis: PTSD (post-traumatic stress disorder)  MDD (major depressive disorder), recurrent episode, moderate (HCC)  Collaboration of Care: Psychiatrist AEB updated Dr. Tasia   Patient/Guardian was advised Release of Information must be obtained prior to any record release in order to collaborate their care with an outside provider. Patient/Guardian was advised if they have not already done so to contact the registration department to sign all necessary forms in order for us  to release information regarding their care.   Consent: Patient/Guardian gives verbal consent for treatment and assignment of benefits for services provided during this visit. Patient/Guardian expressed understanding and agreed to proceed.   Tracey JONELLE [place], LCSW 07/15/2024

## 2024-07-16 ENCOUNTER — Ambulatory Visit (INDEPENDENT_AMBULATORY_CARE_PROVIDER_SITE_OTHER): Admitting: Licensed Clinical Social Worker

## 2024-07-16 ENCOUNTER — Encounter (HOSPITAL_COMMUNITY): Payer: Self-pay | Admitting: Licensed Clinical Social Worker

## 2024-07-16 DIAGNOSIS — F330 Major depressive disorder, recurrent, mild: Secondary | ICD-10-CM

## 2024-07-16 DIAGNOSIS — F4310 Post-traumatic stress disorder, unspecified: Secondary | ICD-10-CM

## 2024-07-16 NOTE — Progress Notes (Signed)
 Virtual Visit via Video Note  I connected with Tracey Beard on 07/16/24 at  8:00 AM EDT by a video enabled telemedicine application and verified that I am speaking with the correct person using two identifiers.  Location: Patient: home Provider: home office   I discussed the limitations of evaluation and management by telemedicine and the availability of in person appointments. The patient expressed understanding and agreed to proceed.   I discussed the assessment and treatment plan with the patient. The patient was provided an opportunity to ask questions and all were answered. The patient agreed with the plan and demonstrated an understanding of the instructions.   The patient was advised to call back or seek an in-person evaluation if the symptoms worsen or if the condition fails to improve as anticipated.  I provided 55 minutes of non-face-to-face time during this encounter.   [name], LCSW   THERAPIST PROGRESS NOTE  Session Time: 8:00am-8:55am  Participation Level: Active  Behavioral Response: Well GroomedAlertEuthymic  Type of Therapy: Individual Therapy  Treatment Goals addressed:  TG: Reduce frequency, intensity, and duration of depression symptoms so that daily functioning is improved (OP Depression)  Disciplines:  Interdisciplinary, PROVIDER    Expected end:  03/17/24          LTG: Increase coping skills to manage depression and improve ability to perform daily activities (OP Depression)  Disciplines:  Interdisciplinary, PROVIDER    Expected end:  03/17/24         ProgressTowards Goals: Progressing  Interventions: CBT  Summary: Tracey Beard is a 43 y.o. female who presents with PTSD and MDD mild.   Suicidal/Homicidal: Nowithout intent/plan  Therapist Response: Tora engaged well in individual virtual session with facilities manager. Clinician utilized CBT to process thoughts, feelings, and behaviors. Clinician identifeid reduction in rage feelings and  behaviors, without the new medication prescribed by Dr. Tasia. Clinician identified concerns about this medication and noted that she has the right to choose whether or not she starts this medication. Clinician encouraged Bonny to consult with nurse and Dr. Tasia about her concerns. Clinician explored relationships, friendships, and parenting. Clinician identified ways to ensure her boundaries are consistent and stable. Clinician also provided feedback about ways to parent her son in regards to perfectionism. Clinician encouraged more joining and reflective listening, rather than making everything okay and ignoring the thoughts.   Plan: Return again in 2 weeks.  Diagnosis: PTSD (post-traumatic stress disorder)  MDD (major depressive disorder), recurrent episode, mild  Collaboration of Care: Psychiatrist AEB Armonie decided to not start on Trilafon. She felt she did not need it at this time. Mood has been more stable and peaceful.   Patient/Guardian was advised Release of Information must be obtained prior to any record release in order to collaborate their care with an outside provider. Patient/Guardian was advised if they have not already done so to contact the registration department to sign all necessary forms in order for us  to release information regarding their care.   Consent: Patient/Guardian gives verbal consent for treatment and assignment of benefits for services provided during this visit. Patient/Guardian expressed understanding and agreed to proceed.   [name] JONELLE Tonsina, LCSW 07/16/2024

## 2024-07-23 ENCOUNTER — Ambulatory Visit (HOSPITAL_COMMUNITY): Admitting: Psychiatry

## 2024-07-23 ENCOUNTER — Ambulatory Visit (INDEPENDENT_AMBULATORY_CARE_PROVIDER_SITE_OTHER): Admitting: Licensed Clinical Social Worker

## 2024-07-23 DIAGNOSIS — F4310 Post-traumatic stress disorder, unspecified: Secondary | ICD-10-CM

## 2024-07-23 NOTE — Progress Notes (Unsigned)
 Virtual Visit via Video Note  I connected with Tracey Beard on 07/23/24 at 12:30 PM EST by a video enabled telemedicine application and verified that I am speaking with the correct person using two identifiers.  Location: Patient: home Provider: home office   I discussed the limitations of evaluation and management by telemedicine and the availability of in person appointments. The patient expressed understanding and agreed to proceed.   I discussed the assessment and treatment plan with the patient. The patient was provided an opportunity to ask questions and all were answered. The patient agreed with the plan and demonstrated an understanding of the instructions.   The patient was advised to call back or seek an in-person evaluation if the symptoms worsen or if the condition fails to improve as anticipated.  I provided 55 minutes of non-face-to-face time during this encounter.   [name], LCSW   THERAPIST PROGRESS NOTE  Session Time: 12:30pm-1:25pm  Participation Level: {BHH PARTICIPATION LEVEL:22264}  Behavioral Response: {Appearance:22683}{BHH LEVEL OF CONSCIOUSNESS:22305}{BHH MOOD:22306}  Type of Therapy: {CHL AMB BH Type of Therapy:21022741}  Treatment Goals addressed: ***  ProgressTowards Goals: {Progress Towards Goals:21014066}  Interventions: {CHL AMB BH Type of Intervention:21022753}  Summary: Tracey Beard is a 43 y.o. female who presents with ***.   Suicidal/Homicidal: {BHH YES OR NO:22294}{yes/no/with/without intent/plan:22693}  Therapist Response: ***  Plan: Return again in *** weeks.  Diagnosis: No diagnosis found.  Collaboration of Care: {BH OP Collaboration of Care:21014065}  Patient/Guardian was advised Release of Information must be obtained prior to any record release in order to collaborate their care with an outside provider. Patient/Guardian was advised if they have not already done so to contact the registration department to sign  all necessary forms in order for us  to release information regarding their care.   Consent: Patient/Guardian gives verbal consent for treatment and assignment of benefits for services provided during this visit. Patient/Guardian expressed understanding and agreed to proceed.   [name], LCSW 07/23/2024

## 2024-07-24 ENCOUNTER — Encounter (HOSPITAL_COMMUNITY): Payer: Self-pay | Admitting: Licensed Clinical Social Worker

## 2024-07-28 ENCOUNTER — Other Ambulatory Visit: Payer: Self-pay | Admitting: Internal Medicine

## 2024-07-28 DIAGNOSIS — R5383 Other fatigue: Secondary | ICD-10-CM

## 2024-07-31 ENCOUNTER — Ambulatory Visit (INDEPENDENT_AMBULATORY_CARE_PROVIDER_SITE_OTHER): Admitting: Licensed Clinical Social Worker

## 2024-07-31 DIAGNOSIS — F330 Major depressive disorder, recurrent, mild: Secondary | ICD-10-CM | POA: Diagnosis not present

## 2024-08-05 ENCOUNTER — Encounter (HOSPITAL_COMMUNITY): Payer: Self-pay | Admitting: Licensed Clinical Social Worker

## 2024-08-05 ENCOUNTER — Ambulatory Visit (INDEPENDENT_AMBULATORY_CARE_PROVIDER_SITE_OTHER): Admitting: Licensed Clinical Social Worker

## 2024-08-05 DIAGNOSIS — F330 Major depressive disorder, recurrent, mild: Secondary | ICD-10-CM | POA: Diagnosis not present

## 2024-08-05 NOTE — Progress Notes (Signed)
 Virtual Visit via Video Note  I connected with Tracey Beard on 07/31/24 at  8:00 AM EST by a video enabled telemedicine application and verified that I am speaking with the correct person using two identifiers.  Location: Patient: home Provider: home office   I discussed the limitations of evaluation and management by telemedicine and the availability of in person appointments. The patient expressed understanding and agreed to proceed.   I discussed the assessment and treatment plan with the patient. The patient was provided an opportunity to ask questions and all were answered. The patient agreed with the plan and demonstrated an understanding of the instructions.   The patient was advised to call back or seek an in-person evaluation if the symptoms worsen or if the condition fails to improve as anticipated.  I provided 45 minutes of non-face-to-face time during this encounter.   [name], LCSW   THERAPIST PROGRESS NOTE  Session Time: 8:00am-8:45am  Participation Level: Active  Behavioral Response: NeatAlertEuthymic  Type of Therapy: Individual Therapy  Treatment Goals addressed:  TG: Reduce frequency, intensity, and duration of depression symptoms so that daily functioning is improved (OP Depression)  Disciplines:  Interdisciplinary, PROVIDER    Expected end:  03/17/24          LTG: Increase coping skills to manage depression and improve ability to perform daily activities (OP Depression)  Disciplines:  Interdisciplinary, PROVIDER    Expected end:  03/17/24       ProgressTowards Goals: Progressing  Interventions: CBT  Summary: Tracey Beard is a 43 y.o. female who presents with MDD, recurrent, mild.   Suicidal/Homicidal: Nowithout intent/plan  Therapist Response: Tracey Beard engaged well in individual virtual session with facilities manager. Clinician utilized CBT to process thoughts, feelings, and interactions. Clinician explored relationship with friend and son.  Clinician identified increased urge and tendency to get involved in the lives of others as a support, but sometimes boundaries get blurred. Clinician processed this urge to help and a calling to support children who are in difficult situations. Clinician identified the importance of having and maintaining good boundaries in order to ensure that her good deeds don't backfire on her. Clinician processed ways for Tracey Beard of this urge and to focus on what is in her control and power, meaning her own family.  Plan: Return again in 1-2 weeks.  Diagnosis: MDD (major depressive disorder), recurrent episode, mild  Collaboration of Care: Psychiatrist AEB decrease in rages, improvement in overall ability to slow herself down before getting too upset or overly emotional.  Patient/Guardian was advised Release of Information must be obtained prior to any record release in order to collaborate their care with an outside provider. Patient/Guardian was advised if they have not already done so to contact the registration department to sign all necessary forms in order for us  to release information regarding their care.   Consent: Patient/Guardian gives verbal consent for treatment and assignment of benefits for services provided during this visit. Patient/Guardian expressed understanding and agreed to proceed.   [name] JONELLE Candor, LCSW 08/05/2024

## 2024-08-06 ENCOUNTER — Encounter (HOSPITAL_COMMUNITY): Payer: Self-pay | Admitting: Licensed Clinical Social Worker

## 2024-08-06 ENCOUNTER — Encounter: Payer: Self-pay | Admitting: Internal Medicine

## 2024-08-06 ENCOUNTER — Ambulatory Visit: Admitting: Internal Medicine

## 2024-08-06 VITALS — BP 110/70

## 2024-08-06 DIAGNOSIS — Z7985 Long-term (current) use of injectable non-insulin antidiabetic drugs: Secondary | ICD-10-CM

## 2024-08-06 DIAGNOSIS — F32A Depression, unspecified: Secondary | ICD-10-CM | POA: Diagnosis not present

## 2024-08-06 DIAGNOSIS — F419 Anxiety disorder, unspecified: Secondary | ICD-10-CM

## 2024-08-06 DIAGNOSIS — E785 Hyperlipidemia, unspecified: Secondary | ICD-10-CM | POA: Diagnosis not present

## 2024-08-06 DIAGNOSIS — E1169 Type 2 diabetes mellitus with other specified complication: Secondary | ICD-10-CM | POA: Diagnosis not present

## 2024-08-06 LAB — POCT GLYCOSYLATED HEMOGLOBIN (HGB A1C): Hemoglobin A1C: 5.7 % — AB (ref 4.0–5.6)

## 2024-08-06 LAB — MICROALBUMIN / CREATININE URINE RATIO
Creatinine,U: 170.1 mg/dL
Microalb Creat Ratio: 8.3 mg/g (ref 0.0–30.0)
Microalb, Ur: 1.4 mg/dL (ref 0.0–1.9)

## 2024-08-06 NOTE — Progress Notes (Signed)
 Established Patient Office Visit     CC/Reason for Visit: Follow-up chronic medical conditions  HPI: Tracey Beard is a 43 y.o. female who is coming in today for the above mentioned reasons. Past Medical History is significant for: Type 2 diabetes, hyperlipidemia, obesity, anxiety and depression followed by psychiatry.  She is feeling well, no major concerns or complaints.  Declines all immunizations for now.   Past Medical/Surgical History: Past Medical History:  Diagnosis Date   Allergy    Anxiety    Arthritis    Chronic back pain    Depression    GERD (gastroesophageal reflux disease)    Gestational diabetes    glyburide    Headache    Heart murmur    Hypertension    Idiopathic intracranial hypertension    Leukocytosis 02/25/2015   Obesity    Pregnancy induced hypertension    labetalol     Past Surgical History:  Procedure Laterality Date   CESAREAN SECTION N/A 01/29/2019   Procedure: CESAREAN SECTION;  Surgeon: Dannielle Bouchard, DO;  Location: [facility] LD ORS;  Service: Obstetrics;  Laterality: N/A;   CHOLECYSTECTOMY     LUMBAR PUNCTURE  2014   5x    Social History:  reports that she has never smoked. She has never used smokeless tobacco. She reports that she does not currently use alcohol. She reports that she does not use drugs.  Allergies: Allergies  Allergen Reactions   Elavil [Amitriptyline Hcl] Shortness Of Breath   Restoril  [Temazepam ] Shortness Of Breath   Trazodone And Nefazodone Anaphylaxis   Diamox [Acetazolamide] Other (See Comments)    Cold chills/loss of taste    Tomato Swelling and Other (See Comments)    Causes lips to swell   Norco [Hydrocodone -Acetaminophen ] Itching   Pollen Extract Other (See Comments)    Unknown reaction    Topamax  [Topiramate ] Other (See Comments)    Chest tightness   Benzocaine Swelling, Rash and Other (See Comments)    Localized swelling  04/30/24: Pt tolerated lidocaine  well   Latex Rash   Novocain [Procaine]  Swelling, Rash and Other (See Comments)    Localized swelling    Family History:  Family History  Problem Relation Age of Onset   Hypertension Mother    Breast cancer Mother    Pancreatic cancer Mother    Diabetes Mother    Cancer Mother    Hypertension Father    Hypertension Maternal Grandmother    Heart disease Maternal Grandmother    Arthritis Maternal Grandmother    Hypertension Maternal Grandfather    Diabetes Maternal Grandfather    Colon cancer Paternal Grandmother    Heart disease Paternal Grandfather    Hypertension Maternal Aunt    Breast cancer Maternal Aunt    Obesity Maternal Aunt    Diabetes Maternal Aunt    Hypertension Maternal Uncle    Liver cancer Maternal Uncle    Alcohol abuse Maternal Uncle    Prostate cancer Maternal Uncle    Miscarriages / Stillbirths Daughter    Asthma Brother    Cancer Maternal Aunt      Current Outpatient Medications:    atorvastatin  (LIPITOR) 20 MG tablet, TAKE ONE TABLET BY MOUTH EVERY DAY, Disp: 90 tablet, Rfl: 1   buPROPion  (WELLBUTRIN  XL) 150 MG 24 hr tablet, 1 qam  for 1 week then 2 qam, Disp: 180 tablet, Rfl: 2   busPIRone  (BUSPAR ) 5 MG tablet, 1 TID, Disp: 270 tablet, Rfl: 1   Continuous Glucose Receiver (FREESTYLE LIBRE  3 READER) DEVI, 1 each by Does not apply route See admin instructions., Disp: 2 each, Rfl: 3   Continuous Glucose Sensor (FREESTYLE LIBRE 3 SENSOR) MISC, 1 each by Does not apply route See admin instructions. Place 1 sensor on the skin every 14 days. Use to check glucose continuously, Disp: 2 each, Rfl: 3   Fremanezumab -vfrm (AJOVY ) 225 MG/1.5ML SOAJ, Inject 225 mg into the skin every 30 (thirty) days., Disp: 1.5 mL, Rfl: 5   OZEMPIC , 2 MG/DOSE, 8 MG/3ML SOPN, Inject 2 mg as directed once a week., Disp: 3 mL, Rfl: 2   tiZANidine  (ZANAFLEX ) 4 MG tablet, TAKE ONE TABLET BY MOUTH THREE TIMES DAILY, Disp: 30 tablet, Rfl: 1   triamterene -hydrochlorothiazide  (MAXZIDE ) 75-50 MG tablet, Take 1 tablet by mouth  daily., Disp: 90 tablet, Rfl: 0   amLODipine  (NORVASC ) 5 MG tablet, TAKE ONE TABLET (5mg  total) BY MOUTH EVERY DAY (Patient not taking: Reported on 08/05/2024), Disp: 90 tablet, Rfl: 1   esomeprazole  (NEXIUM ) 40 MG capsule, Take 1 capsule (40 mg total) by mouth 2 (two) times daily before a meal. (Patient not taking: Reported on 08/06/2024), Disp: 60 capsule, Rfl: 5   ondansetron  (ZOFRAN ) 4 MG tablet, Take 1 tablet (4 mg total) by mouth every 6 (six) hours. (Patient not taking: Reported on 08/06/2024), Disp: 12 tablet, Rfl: 0   ondansetron  (ZOFRAN -ODT) 4 MG disintegrating tablet, Take 1 tablet (4 mg total) by mouth every 8 (eight) hours as needed for nausea or vomiting. (Patient not taking: Reported on 08/06/2024), Disp: 20 tablet, Rfl: 0   promethazine -dextromethorphan (PROMETHAZINE -DM) 6.25-15 MG/5ML syrup, Take 5 mLs by mouth 4 (four) times daily as needed. (Patient not taking: Reported on 08/05/2024), Disp: 150 mL, Rfl: 0   propranolol  ER (INDERAL  LA) 80 MG 24 hr capsule, Take 1 capsule (80 mg total) by mouth at bedtime., Disp: 90 capsule, Rfl: 1   temazepam  (RESTORIL ) 15 MG capsule, 2 qhs (Patient not taking: Reported on 08/06/2024), Disp: 15 capsule, Rfl: 4   topiramate  (TOPAMAX ) 50 MG tablet, Take 1 tablet (50 mg total) by mouth 2 (two) times daily. (Patient not taking: Reported on 08/06/2024), Disp: 60 tablet, Rfl: 5  Review of Systems:  Negative unless indicated in HPI.   Physical Exam: Vitals:   08/06/24 0805  BP: 110/70    There is no height or weight on file to calculate BMI.   Physical Exam Vitals reviewed.  Constitutional:      Appearance: Normal appearance.  HENT:     Head: Normocephalic and atraumatic.  Eyes:     Conjunctiva/sclera: Conjunctivae normal.     Pupils: Pupils are equal, round, and reactive to light.  Cardiovascular:     Rate and Rhythm: Normal rate and regular rhythm.  Pulmonary:     Effort: Pulmonary effort is normal.     Breath sounds: Normal breath  sounds.  Skin:    General: Skin is warm and dry.  Neurological:     General: No focal deficit present.     Mental Status: She is alert and oriented to person, place, and time.  Psychiatric:        Mood and Affect: Mood normal.        Behavior: Behavior normal.        Thought Content: Thought content normal.        Judgment: Judgment normal.      Impression and Plan:  Type 2 diabetes mellitus with other specified complication, without long-term current use of insulin  (HCC) Assessment &  Plan: Excellent control with an A1c of 5.7. Continue Ozempic  2 mg weekly.  Orders: -     POCT glycosylated hemoglobin (Hb A1C) -     Microalbumin / creatinine urine ratio; Future  Anxiety and depression Assessment & Plan: On BuSpar , Wellbutrin , followed by psychiatry, improved.   Hyperlipidemia associated with type 2 diabetes mellitus (HCC) Assessment & Plan: Well-controlled on atorvastatin  20 mg.      Time spent:32 minutes reviewing chart, interviewing and examining patient and formulating plan of care.     Tully Theophilus Andrews, MD [facility]

## 2024-08-06 NOTE — Assessment & Plan Note (Signed)
 Excellent control with an A1c of 5.7. Continue Ozempic  2 mg weekly.

## 2024-08-06 NOTE — Progress Notes (Signed)
   THERAPIST PROGRESS NOTE  Session Time: 12:30pm-1:25pm  Participation Level: Active  Behavioral Response: Well GroomedAlertEuthymic  Type of Therapy: Individual Therapy  Treatment Goals addressed:  LTG: Reduce frequency, intensity, and duration of depression symptoms so that daily functioning is improved (OP Depression)  Disciplines:  Interdisciplinary, PROVIDER    Expected end:  03/17/24          LTG: Increase coping skills to manage depression and improve ability to perform daily activities (OP Depression)  Disciplines:  Interdisciplinary, PROVIDER    Expected end:  03/17/24       ProgressTowards Goals: Progressing  Interventions: CBT  Summary: Tracey Beard is a 43 y.o. female who presents with MDD, recurrent, mild.   Suicidal/Homicidal: Nowithout intent/plan  Therapist Response: Koby engaged well in individual in person session with clinician. Clinician utilized CBT to process thoughts, feelings, and behaviors. Clinician explored any incidents of rage or misbehavior. [name] shared that she is much more stable now that she is not taking propanolol, and she has not experienced any more rage episodes. Clinician discussed relationship with husband and noted that they have been getting along much better and even got to go out together twice over the weekend. [name] shared that she has not changed her overall attitude toward him, but she feels less angry and is looking to increase her peace. [name] shared updates with friendships and noted that she is really maintaining boundaries. She shared this is helping her reduce stress and tension.   Plan: Return again in 2 weeks.  Diagnosis: MDD (major depressive disorder), recurrent episode, mild  Collaboration of Care: Patient refused AEB none required  Patient/Guardian was advised Release of Information must be obtained prior to any record release in order to collaborate their care with an outside provider. Patient/Guardian was  advised if they have not already done so to contact the registration department to sign all necessary forms in order for us  to release information regarding their care.   Consent: Patient/Guardian gives verbal consent for treatment and assignment of benefits for services provided during this visit. Patient/Guardian expressed understanding and agreed to proceed.   [name] [place], LCSW 08/06/2024

## 2024-08-06 NOTE — Assessment & Plan Note (Signed)
Well controlled on atorvastatin 20 mg

## 2024-08-06 NOTE — Assessment & Plan Note (Signed)
On BuSpar, Wellbutrin, followed by psychiatry, improved.

## 2024-08-12 ENCOUNTER — Encounter (HOSPITAL_COMMUNITY): Payer: Self-pay

## 2024-08-12 ENCOUNTER — Ambulatory Visit (HOSPITAL_COMMUNITY): Admitting: Licensed Clinical Social Worker

## 2024-08-26 ENCOUNTER — Other Ambulatory Visit: Payer: Self-pay

## 2024-08-26 ENCOUNTER — Ambulatory Visit (HOSPITAL_COMMUNITY): Admitting: Licensed Clinical Social Worker

## 2024-08-26 ENCOUNTER — Ambulatory Visit (HOSPITAL_COMMUNITY): Admitting: Psychiatry

## 2024-08-26 ENCOUNTER — Encounter (HOSPITAL_COMMUNITY): Payer: Self-pay

## 2024-08-26 VITALS — BP 105/72 | HR 93 | Ht 61.0 in | Wt 172.0 lb

## 2024-08-26 DIAGNOSIS — F324 Major depressive disorder, single episode, in partial remission: Secondary | ICD-10-CM

## 2024-08-26 MED ORDER — BUPROPION HCL ER (XL) 150 MG PO TB24: ORAL_TABLET | ORAL | 2 refills | Status: AC

## 2024-08-26 MED ORDER — BUSPIRONE HCL 5 MG PO TABS: ORAL_TABLET | ORAL | 1 refills | Status: AC
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
## 2024-08-27 ENCOUNTER — Ambulatory Visit: Admitting: Internal Medicine

## 2024-08-27 ENCOUNTER — Encounter: Payer: Self-pay | Admitting: Internal Medicine

## 2024-08-27 VITALS — BP 120/82 | HR 96 | Temp 98.3°F | Wt 174.3 lb

## 2024-08-27 DIAGNOSIS — R053 Chronic cough: Secondary | ICD-10-CM

## 2024-08-27 MED ORDER — CETIRIZINE HCL 10 MG PO TABS: 10.0000 mg | ORAL_TABLET | Freq: Every day | ORAL | 11 refills | Status: AC

## 2024-08-27 NOTE — Progress Notes (Signed)
 Established Patient Office Visit     CC/Reason for Visit: Chronic cough  HPI: Tracey Beard is a 43 y.o. female who is coming in today for the above mentioned reasons.  Has been dealing with this since August at least.  Especially significant at nighttime.  She has had several URIs in that timeframe as well.   Past Medical/Surgical History: Past Medical History:  Diagnosis Date   Allergy    Anxiety    Arthritis    Chronic back pain    Depression    GERD (gastroesophageal reflux disease)    Gestational diabetes    glyburide    Headache    Heart murmur    Hypertension    Idiopathic intracranial hypertension    Leukocytosis 02/25/2015   Obesity    Pregnancy induced hypertension    labetalol     Past Surgical History:  Procedure Laterality Date   CESAREAN SECTION N/A 01/29/2019   Procedure: CESAREAN SECTION;  Surgeon: Dannielle Bouchard, DO;  Location: [facility] LD ORS;  Service: Obstetrics;  Laterality: N/A;   CHOLECYSTECTOMY     LUMBAR PUNCTURE  2014   5x    Social History:  reports that she has never smoked. She has never used smokeless tobacco. She reports that she does not currently use alcohol. She reports that she does not use drugs.  Allergies: Allergies  Allergen Reactions   Elavil [Amitriptyline Hcl] Shortness Of Breath   Restoril  [Temazepam ] Shortness Of Breath   Trazodone And Nefazodone Anaphylaxis   Diamox [Acetazolamide] Other (See Comments)    Cold chills/loss of taste    Tomato Swelling and Other (See Comments)    Causes lips to swell   Norco [Hydrocodone -Acetaminophen ] Itching   Pollen Extract Other (See Comments)    Unknown reaction    Topamax  [Topiramate ] Other (See Comments)    Chest tightness   Benzocaine Swelling, Rash and Other (See Comments)    Localized swelling  04/30/24: Pt tolerated lidocaine  well   Latex Rash   Novocain [Procaine] Swelling, Rash and Other (See Comments)    Localized swelling    Family History:  Family History   Problem Relation Age of Onset   Hypertension Mother    Breast cancer Mother    Pancreatic cancer Mother    Diabetes Mother    Cancer Mother    Hypertension Father    Hypertension Maternal Grandmother    Heart disease Maternal Grandmother    Arthritis Maternal Grandmother    Hypertension Maternal Grandfather    Diabetes Maternal Grandfather    Colon cancer Paternal Grandmother    Heart disease Paternal Grandfather    Hypertension Maternal Aunt    Breast cancer Maternal Aunt    Obesity Maternal Aunt    Diabetes Maternal Aunt    Hypertension Maternal Uncle    Liver cancer Maternal Uncle    Alcohol abuse Maternal Uncle    Prostate cancer Maternal Uncle    Miscarriages / Stillbirths Daughter    Asthma Brother    Cancer Maternal Aunt      Current Outpatient Medications:    amLODipine  (NORVASC ) 5 MG tablet, TAKE ONE TABLET (5mg  total) BY MOUTH EVERY DAY, Disp: 90 tablet, Rfl: 1   atorvastatin  (LIPITOR) 20 MG tablet, TAKE ONE TABLET BY MOUTH EVERY DAY, Disp: 90 tablet, Rfl: 1   buPROPion  (WELLBUTRIN  XL) 150 MG 24 hr tablet, 1 qam  for 1 week then 2 qam, Disp: 180 tablet, Rfl: 2   busPIRone  (BUSPAR ) 5 MG tablet, 1 TID,  Disp: 270 tablet, Rfl: 1   cetirizine (ZYRTEC) 10 MG tablet, Take 1 tablet (10 mg total) by mouth daily., Disp: 30 tablet, Rfl: 11   Continuous Glucose Receiver (FREESTYLE LIBRE 3 READER) DEVI, 1 each by Does not apply route See admin instructions., Disp: 2 each, Rfl: 3   Continuous Glucose Sensor (FREESTYLE LIBRE 3 SENSOR) MISC, 1 each by Does not apply route See admin instructions. Place 1 sensor on the skin every 14 days. Use to check glucose continuously, Disp: 2 each, Rfl: 3   esomeprazole  (NEXIUM ) 40 MG capsule, Take 1 capsule (40 mg total) by mouth 2 (two) times daily before a meal., Disp: 60 capsule, Rfl: 5   Fremanezumab -vfrm (AJOVY ) 225 MG/1.5ML SOAJ, Inject 225 mg into the skin every 30 (thirty) days., Disp: 1.5 mL, Rfl: 5   ondansetron  (ZOFRAN ) 4 MG tablet,  Take 1 tablet (4 mg total) by mouth every 6 (six) hours., Disp: 12 tablet, Rfl: 0   ondansetron  (ZOFRAN -ODT) 4 MG disintegrating tablet, Take 1 tablet (4 mg total) by mouth every 8 (eight) hours as needed for nausea or vomiting., Disp: 20 tablet, Rfl: 0   OZEMPIC , 2 MG/DOSE, 8 MG/3ML SOPN, Inject 2 mg as directed once a week., Disp: 3 mL, Rfl: 2   temazepam  (RESTORIL ) 15 MG capsule, 2 qhs, Disp: 15 capsule, Rfl: 4   tiZANidine  (ZANAFLEX ) 4 MG tablet, TAKE ONE TABLET BY MOUTH THREE TIMES DAILY, Disp: 30 tablet, Rfl: 1   topiramate  (TOPAMAX ) 50 MG tablet, Take 1 tablet (50 mg total) by mouth 2 (two) times daily., Disp: 60 tablet, Rfl: 5   triamterene -hydrochlorothiazide  (MAXZIDE ) 75-50 MG tablet, Take 1 tablet by mouth daily., Disp: 90 tablet, Rfl: 0   promethazine -dextromethorphan (PROMETHAZINE -DM) 6.25-15 MG/5ML syrup, Take 5 mLs by mouth 4 (four) times daily as needed. (Patient not taking: Reported on 08/27/2024), Disp: 150 mL, Rfl: 0  Review of Systems:  Negative unless indicated in HPI.   Physical Exam: Vitals:   08/27/24 0829  BP: 120/82  Pulse: 96  Temp: 98.3 F (36.8 C)  SpO2: 100%  Weight: 174 lb 4.8 oz (79.1 kg)    Body mass index is 32.93 kg/m.   Physical Exam Vitals reviewed.  Constitutional:      Appearance: Normal appearance.  HENT:     Head: Normocephalic and atraumatic.     Mouth/Throat:     Pharynx: Posterior oropharyngeal erythema present.  Eyes:     Conjunctiva/sclera: Conjunctivae normal.  Cardiovascular:     Rate and Rhythm: Normal rate and regular rhythm.  Pulmonary:     Effort: Pulmonary effort is normal.     Breath sounds: Normal breath sounds.  Skin:    General: Skin is warm and dry.  Neurological:     General: No focal deficit present.     Mental Status: She is alert and oriented to person, place, and time.  Psychiatric:        Mood and Affect: Mood normal.        Behavior: Behavior normal.        Thought Content: Thought content normal.         Judgment: Judgment normal.      Impression and Plan:  Chronic cough -     Cetirizine HCl; Take 1 tablet (10 mg total) by mouth daily.  Dispense: 30 tablet; Refill: 11 -     Ambulatory referral to Allergy  - Suspect her chronic cough is related to postnasal drip from chronic allergies.  Start daily cetirizine  and refer to allergy.   Time spent:31 minutes reviewing chart, interviewing and examining patient and formulating plan of care.     Tully Theophilus Andrews, MD [facility]

## 2024-09-01 ENCOUNTER — Encounter: Payer: Self-pay | Admitting: Internal Medicine

## 2024-09-01 MED ORDER — BENZONATATE 200 MG PO CAPS: 200.0000 mg | ORAL_CAPSULE | Freq: Two times a day (BID) | ORAL | 0 refills | Status: AC | PRN

## 2024-09-02 ENCOUNTER — Ambulatory Visit (INDEPENDENT_AMBULATORY_CARE_PROVIDER_SITE_OTHER): Admitting: Licensed Clinical Social Worker

## 2024-09-02 NOTE — Progress Notes (Unsigned)
° °  THERAPIST PROGRESS NOTE  Session Time: 8:00am-8:58am  Participation Level: Active  Behavioral Response: Well GroomedAlertDepressed  Type of Therapy: Individual Therapy  Treatment Goals addressed:  LTG: Reduce frequency, intensity, and duration of depression symptoms so that daily functioning is improved (OP Depression)  Disciplines:  Interdisciplinary, PROVIDER    Expected end:  03/17/24          LTG: Increase coping skills to manage depression and improve ability to perform daily activities (OP Depression)  Disciplines:  Interdisciplinary, PROVIDER    Expected end:  03/17/24       ProgressTowards Goals: {Progress Towards Goals:21014066}  Interventions: {CHL AMB BH Type of Intervention:21022753}  Summary: Tracey Beard is a 43 y.o. female who presents with ***.   Suicidal/Homicidal: {BHH YES OR NO:22294}{yes/no/with/without intent/plan:22693}  Therapist Response: ***  Plan: Return again in *** weeks.  Diagnosis: No diagnosis found.  Collaboration of Care: {BH OP Collaboration of Care:21014065}  Patient/Guardian was advised Release of Information must be obtained prior to any record release in order to collaborate their care with an outside provider. Patient/Guardian was advised if they have not already done so to contact the registration department to sign all necessary forms in order for us  to release information regarding their care.   Consent: Patient/Guardian gives verbal consent for treatment and assignment of benefits for services provided during this visit. Patient/Guardian expressed understanding and agreed to proceed.   [name] [place], LCSW 09/02/2024

## 2024-09-03 ENCOUNTER — Encounter (HOSPITAL_COMMUNITY): Payer: Self-pay | Admitting: Licensed Clinical Social Worker

## 2024-09-08 ENCOUNTER — Other Ambulatory Visit: Payer: Self-pay | Admitting: Internal Medicine

## 2024-09-08 DIAGNOSIS — E1169 Type 2 diabetes mellitus with other specified complication: Secondary | ICD-10-CM

## 2024-09-09 ENCOUNTER — Ambulatory Visit: Admitting: Internal Medicine

## 2024-09-09 ENCOUNTER — Ambulatory Visit (INDEPENDENT_AMBULATORY_CARE_PROVIDER_SITE_OTHER): Admitting: Licensed Clinical Social Worker

## 2024-09-09 ENCOUNTER — Encounter (HOSPITAL_COMMUNITY): Payer: Self-pay | Admitting: Licensed Clinical Social Worker

## 2024-09-09 DIAGNOSIS — F330 Major depressive disorder, recurrent, mild: Secondary | ICD-10-CM | POA: Diagnosis not present

## 2024-09-09 DIAGNOSIS — F4310 Post-traumatic stress disorder, unspecified: Secondary | ICD-10-CM | POA: Diagnosis not present
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
## 2024-09-23 ENCOUNTER — Ambulatory Visit (INDEPENDENT_AMBULATORY_CARE_PROVIDER_SITE_OTHER): Admitting: Licensed Clinical Social Worker

## 2024-09-23 ENCOUNTER — Encounter (HOSPITAL_COMMUNITY): Payer: Self-pay | Admitting: Licensed Clinical Social Worker

## 2024-09-23 DIAGNOSIS — F4310 Post-traumatic stress disorder, unspecified: Secondary | ICD-10-CM | POA: Diagnosis not present

## 2024-09-23 NOTE — Progress Notes (Signed)
" ° °  THERAPIST PROGRESS NOTE  Session Time: 8:00am-8:55am  Participation Level: Active  Behavioral Response: Well GroomedAlerttearful, but not depressed  Type of Therapy: Individual Therapy  Treatment Goals addressed:   Active     OP Depression     LTG: Reduce frequency, intensity, and duration of depression symptoms so that daily functioning is improved (Progressing)     Start:  09/18/23    Expected End:  09/23/25         LTG: Increase coping skills to manage depression and improve ability to perform daily activities (Progressing)     Start:  09/18/23    Expected End:  09/23/25            ProgressTowards Goals: Progressing  Interventions: CBT  Summary: Malanie Koloski is a 44 y.o. female who presents with PTSD.   Suicidal/Homicidal: Nowithout intent/plan  Therapist Response: Maize engaged well in individual in person session with clinician. Clinician utilized CBT to process thoughts, feelings, and behaviors. Clinician processed recent visit [place]. Clinician identified the differences between this visit and the last, several years ago. Clinician reflected the change in Haydyn's attitude and vibe since last visit, noting increased confidence and decreased depression. Clinician discussed the positive interactions with friends and family, and the caring nature of those people toward Felicie and her son.  Clinician processed experience of visiting mother's grave and spending some time meditating and talking to her great aunt. Clinician processed this experience and the regrets about not reuniting with aunt before she died. Clinician validated that feeling and encouraged Jackalynn to continue doing some thinking and feeling with this.   Plan: Return again in 1-2 weeks.  Diagnosis: PTSD (post-traumatic stress disorder)  Collaboration of Care: Patient refused AEB none required  Patient/Guardian was advised Release of Information must be obtained prior to  any record release in order to collaborate their care with an outside provider. Patient/Guardian was advised if they have not already done so to contact the registration department to sign all necessary forms in order for us  to release information regarding their care.   Consent: Patient/Guardian gives verbal consent for treatment and assignment of benefits for services provided during this visit. Patient/Guardian expressed understanding and agreed to proceed.   [name] Fenton, LCSW 09/23/2024  "

## 2024-09-30 ENCOUNTER — Ambulatory Visit (HOSPITAL_COMMUNITY): Admitting: Licensed Clinical Social Worker

## 2024-10-07 ENCOUNTER — Ambulatory Visit (INDEPENDENT_AMBULATORY_CARE_PROVIDER_SITE_OTHER): Admitting: Licensed Clinical Social Worker

## 2024-10-07 ENCOUNTER — Encounter (HOSPITAL_COMMUNITY): Payer: Self-pay

## 2024-10-07 ENCOUNTER — Encounter (HOSPITAL_COMMUNITY): Payer: Self-pay | Admitting: Licensed Clinical Social Worker

## 2024-10-07 DIAGNOSIS — F4310 Post-traumatic stress disorder, unspecified: Secondary | ICD-10-CM

## 2024-10-07 NOTE — Progress Notes (Signed)
" ° °  THERAPIST PROGRESS NOTE  Session Time: 8:00am-8:55am  Participation Level: Active  Behavioral Response: Well GroomedAlertEuthymic  Type of Therapy: Individual Therapy  Treatment Goals addressed:  Active     OP Depression     LTG: Reduce frequency, intensity, and duration of depression symptoms so that daily functioning is improved (Progressing)     Start:  09/18/23    Expected End:  09/23/25         LTG: Increase coping skills to manage depression and improve ability to perform daily activities (Progressing)     Start:  09/18/23    Expected End:  09/23/25            ProgressTowards Goals: Progressing  Interventions: Motivational Interviewing  Summary: Tracey Beard is a 44 y.o. female who presents with PTSD.   Suicidal/Homicidal: Nowithout intent/plan  Therapist Response: Naara engaged well in individual in person session with clinician. Clinician utilized MI OARS to reflect and summarize thoughts, feelings, and interactions. Tracey Beard shared updates in life and spiritual/emotional journey as a woman and as a parent. Clinician processed recent concerns about an acquaintance who is manipulative and harsh. Clinician explored reasons why this woman is so triggering for Tracey Beard and noted that her behavior mirrors the behavior or Tracey Beard's step mother when she was a child. Clinician reflected the similarities in how she felt as a child and how this child feels when he is with his mother. Clinician validated this experience and assisted in drawing connections and managing the awareness.  Tracey Beard processed feelings of sadness and guilt when she learned that her brother and sister in law have been struggling financially and have not had enough food. Tracey Beard shared that she has been reconnecting with them and helping sister in law go to food pantries around town to ensure that they have the nutrition they need. Clinician discussed the passion in this area and encouraged Tracey Beard to look  for volunteer opportunities at [employer], [employer], and other [employer].    Plan: Return again in 2 weeks.  Diagnosis: PTSD (post-traumatic stress disorder)  Collaboration of Care: Patient refused AEB none required  Patient/Guardian was advised Release of Information must be obtained prior to any record release in order to collaborate their care with an outside provider. Patient/Guardian was advised if they have not already done so to contact the registration department to sign all necessary forms in order for us  to release information regarding their care.   Consent: Patient/Guardian gives verbal consent for treatment and assignment of benefits for services provided during this visit. Patient/Guardian expressed understanding and agreed to proceed.   [name], LCSW 10/07/2024  "

## 2024-10-14 ENCOUNTER — Ambulatory Visit (HOSPITAL_COMMUNITY): Admitting: Licensed Clinical Social Worker

## 2024-10-14 ENCOUNTER — Encounter: Payer: Self-pay | Admitting: Internal Medicine

## 2024-10-14 DIAGNOSIS — F330 Major depressive disorder, recurrent, mild: Secondary | ICD-10-CM

## 2024-10-14 DIAGNOSIS — F4310 Post-traumatic stress disorder, unspecified: Secondary | ICD-10-CM

## 2024-10-14 MED ORDER — TRIAMTERENE-HCTZ 75-50 MG PO TABS: 1.0000 | ORAL_TABLET | Freq: Every day | ORAL | 1 refills | Status: AC

## 2024-10-15 ENCOUNTER — Encounter (HOSPITAL_COMMUNITY): Payer: Self-pay | Admitting: Licensed Clinical Social Worker

## 2024-10-15 NOTE — Progress Notes (Signed)
 Virtual Visit via Video Note  I connected with Tracey Beard on 10/14/24 at  9:00 AM EST by a video enabled telemedicine application and verified that I am speaking with the correct person using two identifiers.  Location: Patient: home Provider: home office   I discussed the limitations of evaluation and management by telemedicine and the availability of in person appointments. The patient expressed understanding and agreed to proceed.   I discussed the assessment and treatment plan with the patient. The patient was provided an opportunity to ask questions and all were answered. The patient agreed with the plan and demonstrated an understanding of the instructions.   The patient was advised to call back or seek an in-person evaluation if the symptoms worsen or if the condition fails to improve as anticipated.  I provided 55 minutes of non-face-to-face time during this encounter.   Tracey JONELLE Rosser, LCSW   THERAPIST PROGRESS NOTE  Session Time: 8:00am-8:55am  Participation Level: Active  Behavioral Response: CasualAlertDysphoric  Type of Therapy: Individual Therapy  Treatment Goals addressed:  Active     OP Depression     LTG: Reduce frequency, intensity, and duration of depression symptoms so that daily functioning is improved (Progressing)     Start:  09/18/23    Expected End:  09/23/25         LTG: Increase coping skills to manage depression and improve ability to perform daily activities (Progressing)     Start:  09/18/23    Expected End:  09/23/25            ProgressTowards Goals: Progressing  Interventions: CBT  Summary: Tracey Beard is a 44 y.o. female who presents with MDD, mild, PTSD.   Suicidal/Homicidal: Nowithout intent/plan  Therapist Response: Tracey Beard engaged well in individual virtual session with facilities manager. Clinician utilized CBT to process thoughts, feelings, and interactions. Clinician explored updates with relationships and noted ongoing  strife with a mother from her son's class. Clinician processed the triggers identified last session by this woman. Clinician explored coping skills, communication options, and behavioral strategies to assist in dealing with her. Clinician reflected Tracey Beard's responsibility in communicating her limits. Clinician also identified the importance of giving this woman less energy. Clinician provided coping skills that are more internal, such as thought changing, and see the good. Clinician also noted that if this is not possible, Tracey Beard may have to set the ultimate limit and stop contact altogether.   Plan: Return again in 2 weeks.  Diagnosis: MDD (major depressive disorder), recurrent episode, mild  PTSD (post-traumatic stress disorder)  Collaboration of Care: Patient refused AEB none required. Mood is stable, PTSD sxs are stable.  Patient/Guardian was advised Release of Information must be obtained prior to any record release in order to collaborate their care with an outside provider. Patient/Guardian was advised if they have not already done so to contact the registration department to sign all necessary forms in order for us  to release information regarding their care.   Consent: Patient/Guardian gives verbal consent for treatment and assignment of benefits for services provided during this visit. Patient/Guardian expressed understanding and agreed to proceed.   Tracey JONELLE Laurel, LCSW 10/15/2024

## 2024-10-28 ENCOUNTER — Ambulatory Visit (HOSPITAL_COMMUNITY): Admitting: Licensed Clinical Social Worker

## 2024-11-11 ENCOUNTER — Ambulatory Visit (HOSPITAL_COMMUNITY): Admitting: Licensed Clinical Social Worker

## 2024-12-23 ENCOUNTER — Ambulatory Visit (HOSPITAL_COMMUNITY): Admitting: Psychiatry
